# Patient Record
Sex: Female | Born: 1955
Health system: Southern US, Community
[De-identification: ages and names within clinical notes are randomized; demographics above are authoritative.]

## PROBLEM LIST (undated history)

## (undated) ENCOUNTER — Emergency Department (HOSPITAL_COMMUNITY): Admission: EM | Source: Skilled Nursing Facility

## (undated) DIAGNOSIS — H409 Unspecified glaucoma: Secondary | ICD-10-CM

## (undated) DIAGNOSIS — M1990 Unspecified osteoarthritis, unspecified site: Secondary | ICD-10-CM

## (undated) DIAGNOSIS — J189 Pneumonia, unspecified organism: Secondary | ICD-10-CM

## (undated) DIAGNOSIS — K219 Gastro-esophageal reflux disease without esophagitis: Secondary | ICD-10-CM

## (undated) DIAGNOSIS — Z8781 Personal history of (healed) traumatic fracture: Secondary | ICD-10-CM

## (undated) DIAGNOSIS — Z9889 Other specified postprocedural states: Secondary | ICD-10-CM

## (undated) DIAGNOSIS — F419 Anxiety disorder, unspecified: Secondary | ICD-10-CM

## (undated) DIAGNOSIS — Z8489 Family history of other specified conditions: Secondary | ICD-10-CM

## (undated) DIAGNOSIS — T7840XA Allergy, unspecified, initial encounter: Secondary | ICD-10-CM

## (undated) DIAGNOSIS — R112 Nausea with vomiting, unspecified: Secondary | ICD-10-CM

## (undated) DIAGNOSIS — F329 Major depressive disorder, single episode, unspecified: Secondary | ICD-10-CM

## (undated) DIAGNOSIS — R519 Headache, unspecified: Secondary | ICD-10-CM

## (undated) DIAGNOSIS — I10 Essential (primary) hypertension: Secondary | ICD-10-CM

## (undated) DIAGNOSIS — G4730 Sleep apnea, unspecified: Secondary | ICD-10-CM

## (undated) DIAGNOSIS — T4145XA Adverse effect of unspecified anesthetic, initial encounter: Secondary | ICD-10-CM

## (undated) DIAGNOSIS — F32A Depression, unspecified: Secondary | ICD-10-CM

## (undated) DIAGNOSIS — J449 Chronic obstructive pulmonary disease, unspecified: Secondary | ICD-10-CM

## (undated) DIAGNOSIS — T8859XA Other complications of anesthesia, initial encounter: Secondary | ICD-10-CM

## (undated) DIAGNOSIS — J45909 Unspecified asthma, uncomplicated: Secondary | ICD-10-CM

## (undated) HISTORY — DX: Gastro-esophageal reflux disease without esophagitis: K21.9

## (undated) HISTORY — PX: VAGINAL HYSTERECTOMY: SUR661

## (undated) HISTORY — DX: Anxiety disorder, unspecified: F41.9

## (undated) HISTORY — DX: Depression, unspecified: F32.A

## (undated) HISTORY — PX: OOPHORECTOMY: SHX86

## (undated) HISTORY — DX: Allergy, unspecified, initial encounter: T78.40XA

## (undated) HISTORY — DX: Essential (primary) hypertension: I10

## (undated) HISTORY — PX: TOTAL KNEE ARTHROPLASTY: SHX125

## (undated) HISTORY — DX: Major depressive disorder, single episode, unspecified: F32.9

---
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
## 2005-05-22 ENCOUNTER — Ambulatory Visit: Payer: Self-pay

## 2006-08-13 ENCOUNTER — Ambulatory Visit: Payer: Self-pay

## 2006-08-20 ENCOUNTER — Ambulatory Visit: Payer: Self-pay | Admitting: Family Medicine

## 2007-10-01 ENCOUNTER — Ambulatory Visit: Payer: Self-pay

## 2008-02-10 ENCOUNTER — Ambulatory Visit: Payer: Self-pay

## 2009-02-09 ENCOUNTER — Ambulatory Visit: Payer: Self-pay

## 2009-04-30 LAB — HM COLONOSCOPY: HM COLON: NORMAL

## 2009-09-06 ENCOUNTER — Ambulatory Visit: Payer: Self-pay | Admitting: Family Medicine

## 2009-12-27 ENCOUNTER — Ambulatory Visit: Payer: Self-pay | Admitting: Family Medicine

## 2010-03-01 ENCOUNTER — Ambulatory Visit: Payer: Self-pay

## 2010-08-08 ENCOUNTER — Ambulatory Visit: Payer: Self-pay | Admitting: Internal Medicine

## 2010-08-15 ENCOUNTER — Ambulatory Visit: Payer: Self-pay | Admitting: Internal Medicine

## 2011-03-21 ENCOUNTER — Ambulatory Visit: Payer: Self-pay | Admitting: Family Medicine

## 2012-03-21 ENCOUNTER — Ambulatory Visit: Payer: Self-pay | Admitting: Family Medicine

## 2012-03-31 ENCOUNTER — Ambulatory Visit: Payer: Self-pay | Admitting: Family Medicine

## 2013-04-21 ENCOUNTER — Ambulatory Visit: Payer: Self-pay | Admitting: Family Medicine

## 2013-09-09 DIAGNOSIS — M179 Osteoarthritis of knee, unspecified: Secondary | ICD-10-CM | POA: Insufficient documentation

## 2013-09-09 DIAGNOSIS — M1710 Unilateral primary osteoarthritis, unspecified knee: Secondary | ICD-10-CM | POA: Insufficient documentation

## 2013-10-02 ENCOUNTER — Ambulatory Visit: Payer: Self-pay | Admitting: Unknown Physician Specialty

## 2014-04-15 LAB — LIPID PANEL
CHOLESTEROL: 175 mg/dL (ref 0–200)
HDL: 67 mg/dL (ref 35–70)
LDL Cholesterol: 88 mg/dL
Triglycerides: 102 mg/dL (ref 40–160)

## 2014-04-15 LAB — BASIC METABOLIC PANEL
Creatinine: 1 mg/dL (ref <=1.1)
GLUCOSE: 92 mg/dL

## 2014-04-29 ENCOUNTER — Ambulatory Visit: Payer: Self-pay | Admitting: Family Medicine

## 2014-04-29 LAB — HM MAMMOGRAPHY: HM MAMMO: NORMAL

## 2014-05-04 DIAGNOSIS — S20479A Other superficial bite of unspecified back wall of thorax, initial encounter: Secondary | ICD-10-CM | POA: Diagnosis not present

## 2014-06-16 ENCOUNTER — Ambulatory Visit: Payer: Self-pay | Admitting: Family Medicine

## 2014-06-16 DIAGNOSIS — S8392XA Sprain of unspecified site of left knee, initial encounter: Secondary | ICD-10-CM | POA: Diagnosis not present

## 2014-06-16 DIAGNOSIS — S8992XA Unspecified injury of left lower leg, initial encounter: Secondary | ICD-10-CM | POA: Diagnosis not present

## 2014-06-16 DIAGNOSIS — M25562 Pain in left knee: Secondary | ICD-10-CM | POA: Diagnosis not present

## 2014-06-21 DIAGNOSIS — M1712 Unilateral primary osteoarthritis, left knee: Secondary | ICD-10-CM | POA: Diagnosis not present

## 2014-06-25 ENCOUNTER — Ambulatory Visit: Payer: Self-pay | Admitting: Orthopedic Surgery

## 2014-06-25 DIAGNOSIS — M1712 Unilateral primary osteoarthritis, left knee: Secondary | ICD-10-CM | POA: Diagnosis not present

## 2014-06-25 DIAGNOSIS — Z03818 Encounter for observation for suspected exposure to other biological agents ruled out: Secondary | ICD-10-CM | POA: Diagnosis not present

## 2014-07-14 ENCOUNTER — Ambulatory Visit: Payer: Self-pay | Admitting: Orthopedic Surgery

## 2014-07-14 DIAGNOSIS — E871 Hypo-osmolality and hyponatremia: Secondary | ICD-10-CM | POA: Diagnosis not present

## 2014-07-14 DIAGNOSIS — Z79899 Other long term (current) drug therapy: Secondary | ICD-10-CM | POA: Diagnosis not present

## 2014-07-14 DIAGNOSIS — K219 Gastro-esophageal reflux disease without esophagitis: Secondary | ICD-10-CM | POA: Diagnosis not present

## 2014-07-14 DIAGNOSIS — M1712 Unilateral primary osteoarthritis, left knee: Secondary | ICD-10-CM | POA: Diagnosis not present

## 2014-07-14 DIAGNOSIS — Z01812 Encounter for preprocedural laboratory examination: Secondary | ICD-10-CM | POA: Diagnosis not present

## 2014-07-14 DIAGNOSIS — R0602 Shortness of breath: Secondary | ICD-10-CM | POA: Diagnosis not present

## 2014-07-14 DIAGNOSIS — Z01810 Encounter for preprocedural cardiovascular examination: Secondary | ICD-10-CM | POA: Diagnosis not present

## 2014-07-14 DIAGNOSIS — D62 Acute posthemorrhagic anemia: Secondary | ICD-10-CM | POA: Diagnosis not present

## 2014-07-14 DIAGNOSIS — I10 Essential (primary) hypertension: Secondary | ICD-10-CM | POA: Diagnosis not present

## 2014-07-27 ENCOUNTER — Inpatient Hospital Stay
Admit: 2014-07-27 | Disposition: A | Discharge status: Skilled Nursing Facility | Payer: Self-pay | Attending: Orthopedic Surgery | Admitting: Orthopedic Surgery

## 2014-07-27 DIAGNOSIS — F419 Anxiety disorder, unspecified: Secondary | ICD-10-CM | POA: Diagnosis not present

## 2014-07-27 DIAGNOSIS — Z471 Aftercare following joint replacement surgery: Secondary | ICD-10-CM | POA: Diagnosis not present

## 2014-07-27 DIAGNOSIS — H409 Unspecified glaucoma: Secondary | ICD-10-CM | POA: Diagnosis not present

## 2014-07-27 DIAGNOSIS — K589 Irritable bowel syndrome without diarrhea: Secondary | ICD-10-CM | POA: Diagnosis not present

## 2014-07-27 DIAGNOSIS — D62 Acute posthemorrhagic anemia: Secondary | ICD-10-CM | POA: Diagnosis not present

## 2014-07-27 DIAGNOSIS — F329 Major depressive disorder, single episode, unspecified: Secondary | ICD-10-CM | POA: Diagnosis not present

## 2014-07-27 DIAGNOSIS — M179 Osteoarthritis of knee, unspecified: Secondary | ICD-10-CM | POA: Diagnosis not present

## 2014-07-27 DIAGNOSIS — M5030 Other cervical disc degeneration, unspecified cervical region: Secondary | ICD-10-CM | POA: Diagnosis not present

## 2014-07-27 DIAGNOSIS — I10 Essential (primary) hypertension: Secondary | ICD-10-CM | POA: Diagnosis not present

## 2014-07-27 DIAGNOSIS — G4733 Obstructive sleep apnea (adult) (pediatric): Secondary | ICD-10-CM | POA: Diagnosis not present

## 2014-07-27 DIAGNOSIS — K219 Gastro-esophageal reflux disease without esophagitis: Secondary | ICD-10-CM | POA: Diagnosis not present

## 2014-07-27 DIAGNOSIS — M519 Unspecified thoracic, thoracolumbar and lumbosacral intervertebral disc disorder: Secondary | ICD-10-CM | POA: Diagnosis not present

## 2014-07-27 DIAGNOSIS — E669 Obesity, unspecified: Secondary | ICD-10-CM | POA: Diagnosis not present

## 2014-07-27 DIAGNOSIS — J45909 Unspecified asthma, uncomplicated: Secondary | ICD-10-CM | POA: Diagnosis not present

## 2014-07-27 DIAGNOSIS — M1712 Unilateral primary osteoarthritis, left knee: Secondary | ICD-10-CM | POA: Diagnosis not present

## 2014-07-27 DIAGNOSIS — Z96652 Presence of left artificial knee joint: Secondary | ICD-10-CM | POA: Diagnosis not present

## 2014-07-27 DIAGNOSIS — Z6841 Body Mass Index (BMI) 40.0 and over, adult: Secondary | ICD-10-CM | POA: Diagnosis not present

## 2014-07-27 DIAGNOSIS — G4730 Sleep apnea, unspecified: Secondary | ICD-10-CM | POA: Diagnosis not present

## 2014-07-27 DIAGNOSIS — E871 Hypo-osmolality and hyponatremia: Secondary | ICD-10-CM | POA: Diagnosis not present

## 2014-07-27 DIAGNOSIS — M21952 Unspecified acquired deformity of left thigh: Secondary | ICD-10-CM | POA: Diagnosis not present

## 2014-07-27 HISTORY — PX: TOTAL KNEE ARTHROPLASTY: SHX125

## 2014-07-28 LAB — BASIC METABOLIC PANEL
Anion Gap: 6 — ABNORMAL LOW (ref 7–16)
BUN: 13 mg/dL
CHLORIDE: 94 mmol/L — AB
CO2: 32 mmol/L
Calcium, Total: 8.1 mg/dL — ABNORMAL LOW
Creatinine: 1.01 mg/dL — ABNORMAL HIGH
EGFR (Non-African Amer.): >60
GLUCOSE: 136 mg/dL — AB
POTASSIUM: 3.6 mmol/L
Sodium: 132 mmol/L — ABNORMAL LOW

## 2014-07-28 LAB — PLATELET COUNT: PLATELETS: 293 10*3/uL (ref 150–440)

## 2014-07-28 LAB — HEMOGLOBIN: HGB: 10.7 g/dL — AB (ref 12.0–16.0)

## 2014-07-29 LAB — CBC
HCT: 29.2 % — ABNORMAL LOW (ref 35.0–47.0)
HGB: 9.9 g/dL — ABNORMAL LOW (ref 12.0–16.0)
MCH: 28.4 pg (ref 26.0–34.0)
MCHC: 33.9 g/dL (ref 32.0–36.0)
MCV: 84 fL (ref 80–100)
Platelet: 267 10*3/uL (ref 150–440)
RBC: 3.48 10*6/uL — ABNORMAL LOW (ref 3.80–5.20)
RDW: 14.9 % — ABNORMAL HIGH (ref 11.5–14.5)
WBC: 10.9 10*3/uL (ref 3.6–11.0)

## 2014-07-29 LAB — BASIC METABOLIC PANEL
Anion Gap: 2 — ABNORMAL LOW (ref 7–16)
BUN: 11 mg/dL
CHLORIDE: 97 mmol/L — AB
CO2: 30 mmol/L
Calcium, Total: 7.5 mg/dL — ABNORMAL LOW
Creatinine: 0.8 mg/dL
EGFR (African American): >60
EGFR (Non-African Amer.): >60
GLUCOSE: 121 mg/dL — AB
POTASSIUM: 3.7 mmol/L
Sodium: 129 mmol/L — ABNORMAL LOW

## 2014-07-30 DIAGNOSIS — Z96652 Presence of left artificial knee joint: Secondary | ICD-10-CM | POA: Diagnosis not present

## 2014-07-30 DIAGNOSIS — J4540 Moderate persistent asthma, uncomplicated: Secondary | ICD-10-CM | POA: Diagnosis not present

## 2014-07-30 DIAGNOSIS — L249 Irritant contact dermatitis, unspecified cause: Secondary | ICD-10-CM | POA: Diagnosis not present

## 2014-07-30 DIAGNOSIS — J45909 Unspecified asthma, uncomplicated: Secondary | ICD-10-CM | POA: Diagnosis not present

## 2014-07-30 DIAGNOSIS — G4730 Sleep apnea, unspecified: Secondary | ICD-10-CM | POA: Diagnosis not present

## 2014-07-30 DIAGNOSIS — F419 Anxiety disorder, unspecified: Secondary | ICD-10-CM | POA: Diagnosis not present

## 2014-07-30 DIAGNOSIS — I10 Essential (primary) hypertension: Secondary | ICD-10-CM | POA: Diagnosis not present

## 2014-07-30 DIAGNOSIS — K219 Gastro-esophageal reflux disease without esophagitis: Secondary | ICD-10-CM | POA: Diagnosis not present

## 2014-07-30 DIAGNOSIS — F329 Major depressive disorder, single episode, unspecified: Secondary | ICD-10-CM | POA: Diagnosis not present

## 2014-07-30 DIAGNOSIS — E669 Obesity, unspecified: Secondary | ICD-10-CM | POA: Diagnosis not present

## 2014-07-30 DIAGNOSIS — Z471 Aftercare following joint replacement surgery: Secondary | ICD-10-CM | POA: Diagnosis not present

## 2014-07-30 DIAGNOSIS — M519 Unspecified thoracic, thoracolumbar and lumbosacral intervertebral disc disorder: Secondary | ICD-10-CM | POA: Diagnosis not present

## 2014-07-30 DIAGNOSIS — M150 Primary generalized (osteo)arthritis: Secondary | ICD-10-CM | POA: Diagnosis not present

## 2014-07-30 DIAGNOSIS — E871 Hypo-osmolality and hyponatremia: Secondary | ICD-10-CM | POA: Diagnosis not present

## 2014-07-30 LAB — BASIC METABOLIC PANEL
Anion Gap: 6 — ABNORMAL LOW (ref 7–16)
BUN: 13 mg/dL
CREATININE: 0.78 mg/dL
Calcium, Total: 7.4 mg/dL — ABNORMAL LOW
Chloride: 96 mmol/L — ABNORMAL LOW
Co2: 28 mmol/L
EGFR (African American): >60
EGFR (Non-African Amer.): >60
Glucose: 123 mg/dL — ABNORMAL HIGH
POTASSIUM: 3.8 mmol/L
Sodium: 130 mmol/L — ABNORMAL LOW

## 2014-07-30 LAB — HEPATIC FUNCTION PANEL A (ARMC)
Albumin: 2.8 g/dL — ABNORMAL LOW
Alkaline Phosphatase: 49 U/L
BILIRUBIN INDIRECT: 0.4
BILIRUBIN TOTAL: 0.6 mg/dL
Bilirubin, Direct: 0.2 mg/dL
SGOT(AST): 45 U/L — ABNORMAL HIGH
SGPT (ALT): 22 U/L
TOTAL PROTEIN: 5.9 g/dL — AB

## 2014-07-30 LAB — LIPID PANEL
Cholesterol: 123 mg/dL
HDL: 54 mg/dL
Ldl Cholesterol, Calc: 57 mg/dL
Triglycerides: 59 mg/dL
VLDL Cholesterol, Calc: 12 mg/dL

## 2014-07-30 LAB — TSH: Thyroid Stimulating Horm: 3.768 u[IU]/mL

## 2014-08-03 DIAGNOSIS — J4540 Moderate persistent asthma, uncomplicated: Secondary | ICD-10-CM | POA: Diagnosis not present

## 2014-08-03 DIAGNOSIS — K219 Gastro-esophageal reflux disease without esophagitis: Secondary | ICD-10-CM | POA: Diagnosis not present

## 2014-08-03 DIAGNOSIS — L249 Irritant contact dermatitis, unspecified cause: Secondary | ICD-10-CM | POA: Diagnosis not present

## 2014-08-03 DIAGNOSIS — I10 Essential (primary) hypertension: Secondary | ICD-10-CM | POA: Diagnosis not present

## 2014-08-03 DIAGNOSIS — M150 Primary generalized (osteo)arthritis: Secondary | ICD-10-CM | POA: Diagnosis not present

## 2014-08-10 DIAGNOSIS — J4540 Moderate persistent asthma, uncomplicated: Secondary | ICD-10-CM | POA: Diagnosis not present

## 2014-08-10 DIAGNOSIS — L249 Irritant contact dermatitis, unspecified cause: Secondary | ICD-10-CM | POA: Diagnosis not present

## 2014-08-10 DIAGNOSIS — M150 Primary generalized (osteo)arthritis: Secondary | ICD-10-CM | POA: Diagnosis not present

## 2014-08-13 DIAGNOSIS — G4730 Sleep apnea, unspecified: Secondary | ICD-10-CM | POA: Diagnosis not present

## 2014-08-13 DIAGNOSIS — F329 Major depressive disorder, single episode, unspecified: Secondary | ICD-10-CM | POA: Diagnosis not present

## 2014-08-13 DIAGNOSIS — J45909 Unspecified asthma, uncomplicated: Secondary | ICD-10-CM | POA: Diagnosis not present

## 2014-08-13 DIAGNOSIS — I10 Essential (primary) hypertension: Secondary | ICD-10-CM | POA: Diagnosis not present

## 2014-08-13 DIAGNOSIS — Z471 Aftercare following joint replacement surgery: Secondary | ICD-10-CM | POA: Diagnosis not present

## 2014-08-13 DIAGNOSIS — Z96652 Presence of left artificial knee joint: Secondary | ICD-10-CM | POA: Diagnosis not present

## 2014-08-16 DIAGNOSIS — J45909 Unspecified asthma, uncomplicated: Secondary | ICD-10-CM | POA: Diagnosis not present

## 2014-08-16 DIAGNOSIS — F329 Major depressive disorder, single episode, unspecified: Secondary | ICD-10-CM | POA: Diagnosis not present

## 2014-08-16 DIAGNOSIS — I10 Essential (primary) hypertension: Secondary | ICD-10-CM | POA: Diagnosis not present

## 2014-08-16 DIAGNOSIS — G4730 Sleep apnea, unspecified: Secondary | ICD-10-CM | POA: Diagnosis not present

## 2014-08-16 DIAGNOSIS — Z471 Aftercare following joint replacement surgery: Secondary | ICD-10-CM | POA: Diagnosis not present

## 2014-08-16 DIAGNOSIS — Z96652 Presence of left artificial knee joint: Secondary | ICD-10-CM | POA: Diagnosis not present

## 2014-08-18 DIAGNOSIS — Z96652 Presence of left artificial knee joint: Secondary | ICD-10-CM | POA: Diagnosis not present

## 2014-08-18 DIAGNOSIS — F329 Major depressive disorder, single episode, unspecified: Secondary | ICD-10-CM | POA: Diagnosis not present

## 2014-08-18 DIAGNOSIS — Z471 Aftercare following joint replacement surgery: Secondary | ICD-10-CM | POA: Diagnosis not present

## 2014-08-18 DIAGNOSIS — I10 Essential (primary) hypertension: Secondary | ICD-10-CM | POA: Diagnosis not present

## 2014-08-18 DIAGNOSIS — G4730 Sleep apnea, unspecified: Secondary | ICD-10-CM | POA: Diagnosis not present

## 2014-08-18 DIAGNOSIS — J45909 Unspecified asthma, uncomplicated: Secondary | ICD-10-CM | POA: Diagnosis not present

## 2014-08-20 DIAGNOSIS — F329 Major depressive disorder, single episode, unspecified: Secondary | ICD-10-CM | POA: Diagnosis not present

## 2014-08-20 DIAGNOSIS — J45909 Unspecified asthma, uncomplicated: Secondary | ICD-10-CM | POA: Diagnosis not present

## 2014-08-20 DIAGNOSIS — G4730 Sleep apnea, unspecified: Secondary | ICD-10-CM | POA: Diagnosis not present

## 2014-08-20 DIAGNOSIS — Z96652 Presence of left artificial knee joint: Secondary | ICD-10-CM | POA: Diagnosis not present

## 2014-08-20 DIAGNOSIS — Z471 Aftercare following joint replacement surgery: Secondary | ICD-10-CM | POA: Diagnosis not present

## 2014-08-20 DIAGNOSIS — I10 Essential (primary) hypertension: Secondary | ICD-10-CM | POA: Diagnosis not present

## 2014-08-23 DIAGNOSIS — Z471 Aftercare following joint replacement surgery: Secondary | ICD-10-CM | POA: Diagnosis not present

## 2014-08-23 DIAGNOSIS — G4730 Sleep apnea, unspecified: Secondary | ICD-10-CM | POA: Diagnosis not present

## 2014-08-23 DIAGNOSIS — I10 Essential (primary) hypertension: Secondary | ICD-10-CM | POA: Diagnosis not present

## 2014-08-23 DIAGNOSIS — F329 Major depressive disorder, single episode, unspecified: Secondary | ICD-10-CM | POA: Diagnosis not present

## 2014-08-23 DIAGNOSIS — J45909 Unspecified asthma, uncomplicated: Secondary | ICD-10-CM | POA: Diagnosis not present

## 2014-08-23 DIAGNOSIS — Z96652 Presence of left artificial knee joint: Secondary | ICD-10-CM | POA: Diagnosis not present

## 2014-08-23 LAB — SURGICAL PATHOLOGY

## 2014-08-25 DIAGNOSIS — J45909 Unspecified asthma, uncomplicated: Secondary | ICD-10-CM | POA: Diagnosis not present

## 2014-08-25 DIAGNOSIS — F329 Major depressive disorder, single episode, unspecified: Secondary | ICD-10-CM | POA: Diagnosis not present

## 2014-08-25 DIAGNOSIS — Z471 Aftercare following joint replacement surgery: Secondary | ICD-10-CM | POA: Diagnosis not present

## 2014-08-25 DIAGNOSIS — G4730 Sleep apnea, unspecified: Secondary | ICD-10-CM | POA: Diagnosis not present

## 2014-08-25 DIAGNOSIS — I10 Essential (primary) hypertension: Secondary | ICD-10-CM | POA: Diagnosis not present

## 2014-08-25 DIAGNOSIS — Z96652 Presence of left artificial knee joint: Secondary | ICD-10-CM | POA: Diagnosis not present

## 2014-08-27 DIAGNOSIS — Z96652 Presence of left artificial knee joint: Secondary | ICD-10-CM | POA: Diagnosis not present

## 2014-08-29 NOTE — Op Note (Signed)
PATIENT NAME:  Diane Small, Diane DanceCONDOZA P MR#:  [id number] DATE OF BIRTH:  [birth year]  DATE OF PROCEDURE:  07/27/2014  PREOPERATIVE DIAGNOSIS: Left knee osteoarthritis, severe.   POSTOPERATIVE DIAGNOSIS:  Left knee osteoarthritis, severe.   PROCEDURE: Left total knee replacement.   SURGEON: Kennedy BuckerMichael Jeris Easterly, M.D.   ASSISTANT: Cranston Neighborhris Gaines, PA-C  ANESTHESIA: Spinal.   DESCRIPTION OF PROCEDURE: The patient was brought to the operating room and after adequate anesthesia was obtained, the left leg was prepped and draped in the usual sterile fashion with a bump underneath the left buttock to internally rotate the leg. After prepping and draping in the usual sterile fashion, appropriate patient identification and timeout procedures were completed. The tourniquet was raised to 300 mmHg. A midline skin incision was made followed by a medial parapatellar arthrotomy. The case was made more difficult secondary to the patient's large size, but good exposure of the anterior knee was obtained followed by a medial parapatellar arthrotomy. Inspection revealed significant tricompartmental osteoarthritis with deformity to the lateral femoral condyle. ACL and fat pad were excised and the bone exposed for application of the Medacta cutting guide to the proximal tibia. Tibia cut was carried out and then the femoral distal cut in a similar fashion followed by the 4-in-1 cutting guide to the distal femur. After this was completed, the residual posterior horns of the menisci were removed. The size 2 tibial base plate was applied and hand reaming carried out for the stemmed inserts with her large size. The tibial keel punch was then placed followed by application of the femoral trial, distal femoral drill holes made for subsequent implant fixation. The notch cut was also carried out. A 12 mm insert gave excellent stability throughout a range of motion. It was chosen for the final implant. At this point, the patella was measured and cut  using the guide and sized to a size 2 after 3 drill holes were made for the patellar button. The tourniquet was let down at this point and hemostasis checked with electrocautery. The knee was infiltrated with a dilute solution of Exparel as well as a combination of Toradol, morphine and 0.25% Sensorcaine with epinephrine diluted with saline as well. The tourniquet was raised and the bony surfaces thoroughly irrigated and dried. All components were cemented in place with the size 2 tibial base plate and an 11 x 30 stem, a 2 left sphere femur, the 2 left 12 mm flex insert and a 2 patella, again with all components cemented. After the cement had set and excess cement had been removed, the knee was thoroughly irrigated with Betadine solution with 5 minute soak and then thorough irrigation of the knee. The arthrotomy was repaired using a heavy Quill followed by a subcutaneous drain, 2-0 quill subcutaneously and skin staples. Xeroform, 4 x 4's, ABD, Webril, Ace wrap, and Polar Care applied. The patient was sent to the recovery room in stable condition.   ESTIMATED BLOOD LOSS: 200.   COMPLICATIONS: None.   SPECIMEN: Cut ends of bone.  TOURNIQUET TIME: 75 minutes at 300 mmHg.  IMPLANTS: Medacta GMK sphere, 2+ femur, 2 tibia, 12 mm insert, 11 x 30 tibial stem and 2 patella.  ____________________________ Leitha SchullerMichael J. Bjorn Hallas, MD mjm:sb D: 07/27/2014 10:29:57 ET T: 07/27/2014 12:11:14 ET JOB#: [id number]  cc: Leitha SchullerMichael J. Cortlin Marano, MD, <Dictator> Leitha SchullerMICHAEL J Linn Clavin MD ELECTRONICALLY SIGNED 07/27/2014 16:54

## 2014-08-29 NOTE — Consult Note (Signed)
PATIENT NAME:  Diane Small, Diane Small MR#:  [id number] DATE OF BIRTH:  1955/11/16  DATE OF CONSULTATION:  07/30/2014  REFERRING PHYSICIAN:   Leitha Schuller, MD CONSULTING PHYSICIAN:  Ena Dawley. Clent Ridges, MD  NURSE PRACTITIONER: Lollie Marrow, PA-C.  REASON FOR CONSULTATION: Hyponatremia.   HISTORY OF PRESENT ILLNESS: This very pleasant 59 year old woman with past medical history of obesity, obstructive sleep apnea, hypertension, and anxiety, was admitted on March 29 for left total knee replacement by Dr. Rosita Kea under spinal anesthesia. Procedure was performed without complication. On presentation, her sodium was 132, dropped to 129 on the day after surgery and has increased to 130. Hospitalist services are asked to evaluate hyponatremia. She is asymptomatic. No headache, nausea, malaise or lethargy.   PAST MEDICAL HISTORY:  1. Obstructive sleep apnea.  2. Osteoarthritis.  3. Gastroesophageal reflux disease.  4. Obesity.  5. Irritable bowel syndrome.  6. Hypertension.  7. Glaucoma.  8. Anxiety and depression.   PAST SURGICAL HISTORY:  1. Left total knee replacement 07/27/2014. 2. Cesarean section x2.  3. Hysterectomy.   SOCIAL HISTORY: The patient usually lives alone. She does not use a cane or walker for ambulation at baseline but after the surgery will be using a walker. She is currently discharged to [facility] skilled nursing facility for continued rehab. She does not smoke cigarettes. She drinks alcohol rarely. She is on disability due to knee pain.   FAMILY MEDICAL HISTORY: Positive for coronary artery disease in her father. No family history of strokes, diabetes or renal disease.   REVIEW OF SYSTEMS:  GENERAL: No fevers or chills, weakness, or change in weight.  HEENT: No change in vision or hearing. No pain in the eyes or ears. No sinus congestion, sore throat or difficulty swallowing. She does have a dry mouth.  CARDIOVASCULAR: No chest pain, palpitations, edema, or  orthopnea. No syncope.  RESPIRATORY: No cough, wheezing, shortness of breath, or painful respiration.  GASTROINTESTINAL: Some nausea associated with pain medication, no vomiting, diarrhea, or abdominal pain.  GENITOURINARY: No dysuria or frequency.  MUSCULOSKELETAL: The left knee is very painful after surgery, otherwise no new pain in the neck, back, shoulders, knees, or hips. No swollen tender joints.  SKIN: She does have a new left knee surgical scar. No other new wounds, rashes, or lesions.  NEUROLOGIC: No history of bipolar disorder or schizophrenia. She does have a history of anxiety and depression. Fairly anxious at this time. Her primary care provider has been working with her on this and changing medications recently.   HOME MEDICATIONS:  1. Vitamin C 1000 mg 2 tablets once a day.  2. Tramadol 50 mg 1 tablet every 6 hours as needed for pain.  3. Simethicone 80 mg 1 tablet every 6 hours as needed for gas. 4. Pantoprazole 40 mg 1 tablet daily.  5. Oxycodone 10 mg 1 tablet every 4 hours as needed for pain.  6. Micardis 40 mg 1 tablet once a day.  7. Magnesium hydroxide 8% oral suspension 30 mL orally twice a day as needed for constipation.  8. Lexapro 5 mg 1 tablet once a day in the morning.  9. Latanoprost ophthalmic 0.005% ophthalmic solution one drop to each eye in the morning.  10. Hydrochlorothiazide 25 mg 1 tablet once a day.  11. Fish oil 1200 mg 1 capsule once a day.  12. Enoxaparin 40 mg subcutaneously once a day for 14 days after the surgery.  13. Bisacodyl 10 mg rectal suppository once a day  as needed for constipation.  14. Aspirin 81 mg daily.  15. Alprazolam 1 mg orally 1 tablet twice a day as needed for anxiety. 16. Albuterol ipratropium CFC free 100 mcg/20 mcg per inhalation 1 puff inhaled 4 times a day as needed for wheezing.  17. Acetaminophen 500 mg 1 tablet every 4 hours as needed for pain or fever.   ALLERGIES: No known allergies.   PHYSICAL EXAMINATION:  VITAL  SIGNS: Temperature 97.9, pulse 98, respirations 18, blood pressure 132/77, oxygenation 96% on room air.  GENERAL: No acute distress.  HEENT: Pupils equal, round, and reactive to light. Conjunctivae are clear. Extraocular motion is intact. Oral mucous membranes are pink and moist. Good dentition. Posterior oropharynx is clear with no exudate, edema, or erythema.  NECK: Supple. No cervical lymphadenopathy. Trachea is midline.  RESPIRATORY: Lungs are clear to auscultation bilaterally with good air movement. No respiratory distress. No rhonchi, wheezes, or rales.  CARDIOVASCULAR: Regular rate and rhythm. No murmurs, rubs, or gallops. No carotid bruit. No JVD. No peripheral edema. Peripheral pulses are 2+.  ABDOMEN: Soft, nontender, nondistended. No guarding. No rebound. Bowel sounds are normal. No hepatosplenomegaly or mass noted. She is obese.  SKIN: No rashes, lesions, or open wounds.  MUSCULOSKELETAL: Strength is five out of five throughout with the exception of the left leg which was not tested. Range of motion normal throughout.  NEUROLOGIC: Cranial nerves II through XII grossly intact. Sensation and strength are intact. Nonfocal.  PSYCHIATRIC: She is anxious. She is alert and oriented with good insight into her clinical condition.   LABORATORY DATA: Sodium 130, potassium 3.8, chloride 96, bicarbonate 29, BUN 13, creatinine 0.78, glucose 123. Calcium is 7.4. White blood cells 10.9, hemoglobin 9.9, platelets 267. MCV is 84.  IMAGING: X-ray of the left hip after surgery March 29 shows left total knee joint prosthesis in place without complication.   ASSESSMENT AND PLAN:  1. Left knee osteoarthritis status post total knee replacement: Physical therapy, pain control and DVT prophylaxis per orthopedics.  2. Hyponatremia: The patient states that she has never been hyponatremic before. I have ordered a few labs including hepatic function, TSH, and lipid panel. I think that the most likely cause of her  hyponatremia is decreased p.o. intake with hydration with lactated Ringer's as well as the 2 home medications, hydrochlorothiazide and Lexapro, which can both contribute to hyponatremia. At this point, I think that she is asymptomatic with an improving sodium level. Agree with fluid restriction. Agree with holding hydrochlorothiazide. Her blood pressure has been well controlled over the past 24 hours and she has not received this medication during that time. Would continue to hold hydrochlorothiazide. If blood pressure increases, would attempt to use a different agent such as lisinopril which would not impact her sodium level. If she continues to be hyponatremic, I would consider changing her SSRI. I hesitate to do this now as she has just had some changes and feels that the new SSRI does help "even her out." I think that she is stable for discharge. She needs a repeat BMET on Sunday, April 3. From that point, the attending at [facility] can adjust medications, diet and fluid to manage her hyponatremia.  3. Hypertension: Controlled at this time. Continue with Micardis. Hold hydrochlorothiazide.  4. Anxiety and depression: Continue with Lexapro and Xanax.  5. Irritable bowel syndrome: She is asymptomatic at this time. I agree with a robust bowel regimen, as she may become constipated with the addition of narcotic pain medications.  The patient is a full code.   Thank you for this consultation. I think that the patient is safe for discharge to skilled nursing at this time. I will put in orders for a repeat BMP on Sunday, April 3.  TIME SPENT ON CONSULTATION: 40 minutes.   ____________________________ Ena Dawley. Clent Ridges, MD cpw:jh D: 07/30/2014 11:20:36 ET T: 07/30/2014 11:31:22 ET JOB#: [id number]  cc: Santina Evans P. Clent Ridges, MD, <Dictator> Gale Journey MD ELECTRONICALLY SIGNED 08/07/2014 14:38

## 2014-08-29 NOTE — Discharge Summary (Signed)
PATIENT NAME:  Diane Small, Diane Small MR#:  [id number] DATE OF BIRTH:  [birth year]  DATE OF ADMISSION:  07/27/2014 DATE OF DISCHARGE:  07/30/2014  ADMITTING DIAGNOSIS: Left knee osteoarthritis.   DISCHARGE DIAGNOSIS: Left knee osteoarthritis.   PROCEDURE: Left total knee arthroplasty.   SURGEON: Leitha SchullerMichael J. Menz, M.D.   ASSISTANT: Cranston Neighborhris Gaines, PA-C.   ANESTHESIA: Spinal.   ESTIMATED BLOOD LOSS: 200 mL.   COMPLICATIONS: None.   SPECIMEN: Cut ends of bone.   TOURNIQUET TIME: 75 minutes at 300 mmHg.  IMPLANTS: Medacta GMK sphere, 2+ femur, 2 tibia, 12 mm insert, 11 x 30 tibial stem and 2 patella.   HISTORY: The patient is a 59 year old female who has had progressive left knee pain for greater than 1 year. Pain has interfered with her activities of daily living. She has failed nonoperative treatment including cortisone injections, anti-inflammatory medications as well as physical supplementation. The patient has agreed and consented to a left total knee arthroplasty.   PHYSICAL EXAMINATION: GENERAL: Obese female in no apparent distress. Normal affect. Antalgic component to left lower extremity.  HEART: Regular rate and rhythm.  LUNGS: Clear to auscultation bilaterally. No wheezes, rales or rhonchi.  LEFT KNEE: The patient has mild swelling with no warmth, erythema or effusion. She is tender along the medial and lateral joint line. She has 0 to 110 degrees range of motion. There is no instability with valgus or varus stress testing. She has a negative Homans sign. She is neurovascularly intact to the left lower extremity.   HOSPITAL COURSE: The patient was admitted to the hospital on July 27, 2014. She had surgery that same day and was brought to the orthopedic floor from the PACU in stable condition. On postoperative day 1, the patient had an acute postoperative blood loss anemia with a hemoglobin of 10.7. Potassium was down to 132. On postoperative day 2, potassium was down to 129, her  fluids were restricted to 1400 mL daily, she was continued with normal saline and her hydrochlorothiazide was discontinued. The patient's hemoglobin was down to 9.9. Her vital signs were stable. She was not having any issues with hypertension. Pain was well controlled and she did make good slow progress with physical therapy. On postoperative day 3, sodium was up to 130 but was still low. Medicine was consulted and they agreed with discontinuing the hydrochlorothiazide at this time and giving IV saline. The patient will have labs checked on August 01, 2014 at [facility] and treating physician will adjust medications as needed. By postoperative day 3, the patient was stable and ready for discharge to a rehab facility.   CONDITION AT DISCHARGE: Stable.   DISCHARGE INSTRUCTIONS: The patient may increase weight-bearing on the effected extremity. Elevate the effected foot or leg on 1 or 2 pillows with the foot higher than the knee. Thigh-high TED hose on both legs, remove 1 hour per eight hour shift. Elevate the heels off the bed. Incentive spirometer every hour while awake. Encourage cough and deep breathing. The patient may resume a regular diet as tolerated. Continue using Polar Care unit maintaining the temp between 40 and 50 degrees. Do not get the dressing or bandage wet or dirty. [facility] if the dressing gets water under it. Leave the dressing on. Call [facility]opedics if you experience any increased bright red bleeding from the incision wound, fever above 101.5 degrees, redness, swelling, or drainage at the incision site. Call [facility] orthopedics if you experience any increased leg pain,  numbness or weakness in hour legs, bowel or bladder symptoms. Physical therapy has been arranged for continuation at rehab facility. Please call [facility] orthopedics for follow-up in 2 weeks.   DISCHARGE MEDICATIONS: Please see list of discharge medications on discharge  instructions.    Please stop the following medications: Hydrochlorothiazide 25 mg 1 tab orally once a day. Please also have the patient's labs rechecked on August 01, 2014.   ____________________________ T. Cranston Neighbor, PA-C tcg:sb D: 07/30/2014 11:46:21 ET T: 07/30/2014 12:06:36 ET JOB#: [id number]  cc: T. Cranston Neighbor, PA-C, <Dictator> Evon Slack [place] ELECTRONICALLY SIGNED 08/02/2014 15:41

## 2014-09-01 DIAGNOSIS — E668 Other obesity: Secondary | ICD-10-CM | POA: Insufficient documentation

## 2014-09-01 DIAGNOSIS — Z0000 Encounter for general adult medical examination without abnormal findings: Secondary | ICD-10-CM | POA: Insufficient documentation

## 2014-09-01 DIAGNOSIS — I10 Essential (primary) hypertension: Secondary | ICD-10-CM | POA: Insufficient documentation

## 2014-09-01 DIAGNOSIS — H409 Unspecified glaucoma: Secondary | ICD-10-CM | POA: Insufficient documentation

## 2014-09-01 DIAGNOSIS — Z96652 Presence of left artificial knee joint: Secondary | ICD-10-CM | POA: Diagnosis not present

## 2014-09-01 DIAGNOSIS — Z6841 Body Mass Index (BMI) 40.0 and over, adult: Secondary | ICD-10-CM | POA: Insufficient documentation

## 2014-09-01 DIAGNOSIS — K219 Gastro-esophageal reflux disease without esophagitis: Secondary | ICD-10-CM | POA: Insufficient documentation

## 2014-09-03 DIAGNOSIS — Z96652 Presence of left artificial knee joint: Secondary | ICD-10-CM | POA: Diagnosis not present

## 2014-09-06 DIAGNOSIS — Z96652 Presence of left artificial knee joint: Secondary | ICD-10-CM | POA: Diagnosis not present

## 2014-09-08 DIAGNOSIS — Z96652 Presence of left artificial knee joint: Secondary | ICD-10-CM | POA: Diagnosis not present

## 2014-09-10 DIAGNOSIS — Z96652 Presence of left artificial knee joint: Secondary | ICD-10-CM | POA: Diagnosis not present

## 2014-09-13 DIAGNOSIS — Z96652 Presence of left artificial knee joint: Secondary | ICD-10-CM | POA: Diagnosis not present

## 2014-09-15 DIAGNOSIS — Z96652 Presence of left artificial knee joint: Secondary | ICD-10-CM | POA: Diagnosis not present

## 2014-09-17 DIAGNOSIS — Z96652 Presence of left artificial knee joint: Secondary | ICD-10-CM | POA: Diagnosis not present

## 2014-09-28 DIAGNOSIS — Z96652 Presence of left artificial knee joint: Secondary | ICD-10-CM | POA: Diagnosis not present

## 2014-10-01 DIAGNOSIS — Z96652 Presence of left artificial knee joint: Secondary | ICD-10-CM | POA: Diagnosis not present

## 2014-10-05 DIAGNOSIS — Z96652 Presence of left artificial knee joint: Secondary | ICD-10-CM | POA: Diagnosis not present

## 2014-10-07 DIAGNOSIS — Z96652 Presence of left artificial knee joint: Secondary | ICD-10-CM | POA: Diagnosis not present

## 2014-10-12 DIAGNOSIS — Z96652 Presence of left artificial knee joint: Secondary | ICD-10-CM | POA: Diagnosis not present

## 2014-10-14 DIAGNOSIS — Z96652 Presence of left artificial knee joint: Secondary | ICD-10-CM | POA: Diagnosis not present

## 2014-10-15 ENCOUNTER — Encounter: Payer: Self-pay | Admitting: Family Medicine

## 2014-10-15 ENCOUNTER — Ambulatory Visit (INDEPENDENT_AMBULATORY_CARE_PROVIDER_SITE_OTHER): Payer: Medicare Other | Admitting: Family Medicine

## 2014-10-15 VITALS — BP 120/84 | HR 72 | Ht 63.0 in | Wt 270.0 lb

## 2014-10-15 DIAGNOSIS — I10 Essential (primary) hypertension: Secondary | ICD-10-CM | POA: Diagnosis not present

## 2014-10-15 DIAGNOSIS — K219 Gastro-esophageal reflux disease without esophagitis: Secondary | ICD-10-CM

## 2014-10-15 MED ORDER — HYDROCHLOROTHIAZIDE 25 MG PO TABS: 25.0000 mg | ORAL_TABLET | Freq: Every day | ORAL | Status: DC

## 2014-10-15 MED ORDER — PANTOPRAZOLE SODIUM 40 MG PO TBEC: 40.0000 mg | DELAYED_RELEASE_TABLET | Freq: Every day | ORAL | Status: DC

## 2014-10-15 MED ORDER — TELMISARTAN 40 MG PO TABS: 40.0000 mg | ORAL_TABLET | Freq: Every day | ORAL | Status: DC

## 2014-10-15 NOTE — Progress Notes (Signed)
Name: Diane Small   MRN: [id number]    DOB: [birth year]   Date:10/15/2014       Progress Note  Subjective  Chief Complaint  Chief Complaint  Patient presents with  . Hypertension  . Gastrophageal Reflux    Hypertension This is a recurrent problem. The current episode started more than 1 year ago. The problem is unchanged. The problem is controlled. Pertinent negatives include no anxiety, blurred vision, chest pain, headaches, malaise/fatigue, neck pain, orthopnea, palpitations, peripheral edema, PND, shortness of breath or sweats. There are no associated agents to hypertension. Risk factors for coronary artery disease include obesity and post-menopausal state. Past treatments include angiotensin blockers and diuretics. The current treatment provides no improvement. There are no compliance problems.  There is no history of angina, kidney disease, CAD/MI, CVA, heart failure, left ventricular hypertrophy, PVD or retinopathy. There is no history of chronic renal disease.  Gastrophageal Reflux She reports no abdominal pain, no belching, no chest pain, no coughing, no dysphagia, no heartburn or no hoarse voice. This is a recurrent problem. The current episode started more than 1 year ago. The problem occurs frequently. The problem has been unchanged. The symptoms are aggravated by lying down. There are no known risk factors. She has tried a PPI and a diet change for the symptoms. The treatment provided mild relief.    No problem-specific assessment & plan notes found for this encounter.   Past Medical History  Diagnosis Date  . GERD (gastroesophageal reflux disease)   . Hypertension   . Anxiety   . Depression     Past Surgical History  Procedure Laterality Date  . Vaginal hysterectomy    . Total knee arthroplasty Left 02/29/2015    Family History  Problem Relation Age of Onset  . Cancer Father   . Heart disease Father   . Cancer Maternal Aunt   . Cancer Paternal Aunt   .  Cancer Paternal Uncle     History   Social History  . Marital Status: Divorced    Spouse Name: N/A  . Number of Children: N/A  . Years of Education: N/A   Occupational History  . Not on file.   Social History Main Topics  . Smoking status: Never Smoker   . Smokeless tobacco: Not on file  . Alcohol Use: No  . Drug Use: No  . Sexual Activity: No   Other Topics Concern  . Not on file   Social History Narrative    No Known Allergies   Review of Systems  Constitutional: Negative for fever and malaise/fatigue.  HENT: Negative for congestion and hoarse voice.   Eyes: Negative for blurred vision.  Respiratory: Negative for cough and shortness of breath.   Cardiovascular: Negative for chest pain, palpitations, orthopnea and PND.  Gastrointestinal: Negative for heartburn, dysphagia, abdominal pain, constipation and blood in stool.  Genitourinary: Negative for dysuria, urgency, frequency and hematuria.  Musculoskeletal: Negative for neck pain.  Skin: Negative for rash.  Neurological: Negative for dizziness, tingling, tremors and headaches.  Endo/Heme/Allergies: Negative for environmental allergies and polydipsia. Does not bruise/bleed easily.  Psychiatric/Behavioral: Positive for depression. Negative for suicidal ideas and hallucinations. The patient is nervous/anxious.        Grief stage     Objective  Filed Vitals:   10/15/14 0856  BP: 120/84  Pulse: 72  Height: 5' 3"  (1.6 m)  Weight: 270 lb (122.471 kg)    Physical Exam  Constitutional: She is well-developed, well-nourished, and in no  distress. No distress.  HENT:  Head: Normocephalic and atraumatic.  Right Ear: External ear normal.  Left Ear: External ear normal.  Nose: Nose normal.  Mouth/Throat: Oropharynx is clear and moist.  Eyes: Conjunctivae and EOM are normal. Pupils are equal, round, and reactive to light. Right eye exhibits no discharge. Left eye exhibits no discharge.  Neck: Normal range of motion.  Neck supple. No JVD present. No thyromegaly present.  Cardiovascular: Normal rate, regular rhythm, normal heart sounds and intact distal pulses.  Exam reveals no gallop and no friction rub.   No murmur heard. Pulmonary/Chest: Effort normal and breath sounds normal.  Abdominal: Soft. Bowel sounds are normal. She exhibits no mass. There is no tenderness. There is no guarding.  Musculoskeletal: Normal range of motion. She exhibits no edema.  Lymphadenopathy:    She has no cervical adenopathy.  Neurological: She is alert. She has normal reflexes.  Skin: Skin is warm and dry. She is not diaphoretic.  Psychiatric: Mood and affect normal.      Recent Results (from the past 2160 hour(s))  Surgical pathology     Status: None   Collection Time: 07/27/14 12:00 AM  Result Value Ref Range   SURGICAL PATHOLOGY      Surgical Procedure CASE: [id number] PATIENT: Loreda Small Surgical Pathology Report     SPECIMEN SUBMITTED: A. Knee left, fragments  CLINICAL HISTORY: None provided  PRE-OPERATIVE DIAGNOSIS: Primary osteoarthritis  POST-OPERATIVE DIAGNOSIS: Same/ Left total knee Arthroplasty     DIAGNOSIS: A. LEFT KNEE FRAGMENTS; LEFT TOTAL KNEE ARTHROPLASTY: - MILD TO MODERATE DEGENERATIVE OSTEOARTHROSIS.   GROSS DESCRIPTION: A. The specimen is received in a formalin-filled container labeled with the patient's name and left knee fragments.  Measurement: Aggregate 13.8 x 10 x 5 cm Description: Pink red to tan bone fragments with small amounts of soft tissue Articular surfaces: Mild to moderately eroded and roughened Gross evidence of gout: No Other findings: None  Block summary: 1 - representative sections, post decalcification    Final Diagnosis performed by Delorse Lek, MD.  Electronically signed 07/28/2014 1:32:56PM    The electronic signature in dicates that the named Attending Pathologist has evaluated the specimen  Technical component performed at  [facility], [address] [place], [place] [address] Lab: [phone] Dir: Darrick Penna. Evette Doffing, MD  Professional component performed at [facility], [facility], [place], [place], [place] [address] Lab: [phone] Dir: Dellia Nims. Rubinas, MD    Hemoglobin     Status: Abnormal   Collection Time: 07/28/14  4:31 AM  Result Value Ref Range   HGB 10.7 (L) 12.0-16.0 g/dL  Basic metabolic panel     Status: Abnormal   Collection Time: 07/28/14  4:31 AM  Result Value Ref Range   Glucose, CSF DNP mg/dL    Comment: 65-99 NOTE: New Reference Range  07/06/14    BUN 13 mg/dL    Comment: 6-20 NOTE: New Reference Range  07/06/14    Creatinine 1.01 (H) mg/dL    Comment: 0.44-1.00 NOTE: New Reference Range  07/06/14    Sodium, Urine Random DNP mmol/L    Comment: 135-145 NOTE: New Reference Range  07/06/14    Potassium, Urine Random DNP mmol/L    Comment: 3.5-5.1 NOTE: New Reference Range  07/06/14    Chloride, Urine Random DNP mmol/L    Comment: 101-111 NOTE: New Reference Range  07/06/14    Co2 32 mmol/L    Comment: 22-32 NOTE: New Reference Range  07/06/14    Calcium, Total  8.1 (L) mg/dL    Comment: 8.9-10.3 NOTE: New Reference Range  07/06/14    Anion Gap 6 (L) 7-16   EGFR (African American) >60    EGFR (Non-African Amer.) >60     Comment: eGFR values <41m/min/1.73 m2 may be an indication of chronic kidney disease (CKD). Calculated eGFR is useful in patients with stable renal function. The eGFR calculation will not be reliable in acutely ill patients when serum creatinine is changing rapidly. It is not useful in patients on dialysis. The eGFR calculation may not be applicable to patients at the low and high extremes of body sizes, pregnant women, and vegetarians.    Glucose 136 (H) mg/dL    Comment: 65-99 NOTE: New Reference Range  07/06/14    Sodium 132 (L) mmol/L    Comment: 135-145 NOTE: New Reference Range  07/06/14    Potassium  3.6 mmol/L    Comment: 3.5-5.1 NOTE: New Reference Range  07/06/14    Chloride 94 (L) mmol/L    Comment: 101-111 NOTE: New Reference Range  07/06/14   Platelet count     Status: None   Collection Time: 07/28/14  4:31 AM  Result Value Ref Range   Platelet 293 150-440 x10 3/mm 3  CBC     Status: Abnormal   Collection Time: 07/29/14  6:08 AM  Result Value Ref Range   WBC 10.9 3.6-11.0 x10 3/mm 3   RBC 3.48 (L) 3.80-5.20 X10 6/mm 3   HGB 9.9 (L) 12.0-16.0 g/dL   HCT 29.2 (L) 35.0-47.0 %   MCV 84 80-100 fL   MCH 28.4 26.0-34.0 pg   MCHC 33.9 32.0-36.0 g/dL   RDW 14.9 (H) 11.5-14.5 %   Platelet 267 150-440 x10 3/mm 3  Basic metabolic panel     Status: Abnormal   Collection Time: 07/29/14  6:08 AM  Result Value Ref Range   Glucose, CSF DNP mg/dL    Comment: 65-99 NOTE: New Reference Range  07/06/14    BUN 11 mg/dL    Comment: 6-20 NOTE: New Reference Range  07/06/14    Creatinine 0.80 mg/dL    Comment: 0.44-1.00 NOTE: New Reference Range  07/06/14    Sodium, Urine Random DNP mmol/L    Comment: 135-145 NOTE: New Reference Range  07/06/14    Potassium, Urine Random DNP mmol/L    Comment: 3.5-5.1 NOTE: New Reference Range  07/06/14    Chloride, Urine Random DNP mmol/L    Comment: 101-111 NOTE: New Reference Range  07/06/14    Co2 30 mmol/L    Comment: 22-32 NOTE: New Reference Range  07/06/14    Calcium, Total 7.5 (L) mg/dL    Comment: 8.9-10.3 NOTE: New Reference Range  07/06/14    Anion Gap 2 (L) 7-16   EGFR (African American) >60    EGFR (Non-African Amer.) >60     Comment: eGFR values <656mmin/1.73 m2 may be an indication of chronic kidney disease (CKD). Calculated eGFR is useful in patients with stable renal function. The eGFR calculation will not be reliable in acutely ill patients when serum creatinine is changing rapidly. It is not useful in patients on dialysis. The eGFR calculation may not be applicable to patients at the low and high  extremes of body sizes, pregnant women, and vegetarians.    Glucose 121 (H) mg/dL    Comment: 65-99 NOTE: New Reference Range  07/06/14    Sodium 129 (L) mmol/L    Comment: 135-145 NOTE: New Reference Range  07/06/14  Potassium 3.7 mmol/L    Comment: 3.5-5.1 NOTE: New Reference Range  07/06/14    Chloride 97 (L) mmol/L    Comment: 101-111 NOTE: New Reference Range  38/10/17   Basic metabolic panel     Status: Abnormal   Collection Time: 07/30/14  6:17 AM  Result Value Ref Range   Glucose, CSF DNP mg/dL    Comment: 65-99 NOTE: New Reference Range  07/06/14    BUN 13 mg/dL    Comment: 6-20 NOTE: New Reference Range  07/06/14    Creatinine 0.78 mg/dL    Comment: 0.44-1.00 NOTE: New Reference Range  07/06/14    Sodium, Urine Random DNP mmol/L    Comment: 135-145 NOTE: New Reference Range  07/06/14    Potassium, Urine Random DNP mmol/L    Comment: 3.5-5.1 NOTE: New Reference Range  07/06/14    Chloride, Urine Random DNP mmol/L    Comment: 101-111 NOTE: New Reference Range  07/06/14    Co2 28 mmol/L    Comment: 22-32 NOTE: New Reference Range  07/06/14    Calcium, Total 7.4 (L) mg/dL    Comment: 8.9-10.3 NOTE: New Reference Range  07/06/14    Anion Gap 6 (L) 7-16   EGFR (African American) >60    EGFR (Non-African Amer.) >60     Comment: eGFR values <24m/min/1.73 m2 may be an indication of chronic kidney disease (CKD). Calculated eGFR is useful in patients with stable renal function. The eGFR calculation will not be reliable in acutely ill patients when serum creatinine is changing rapidly. It is not useful in patients on dialysis. The eGFR calculation may not be applicable to patients at the low and high extremes of body sizes, pregnant women, and vegetarians.  - K-HEMOLYSIS AT THIS LEVEL MAY AFFECT THE RESULT    Glucose 123 (H) mg/dL    Comment: 65-99 NOTE: New Reference Range  07/06/14    Sodium 130 (L) mmol/L    Comment:  135-145 NOTE: New Reference Range  07/06/14    Potassium 3.8 mmol/L    Comment: 3.5-5.1 NOTE: New Reference Range  07/06/14    Chloride 96 (L) mmol/L    Comment: 101-111 NOTE: New Reference Range  07/06/14   TSH     Status: None   Collection Time: 07/30/14  6:47 AM  Result Value Ref Range   Thyroid Stimulating Horm 3.768 uIU/mL    Comment: 0.350-4.500 NOTE: New Reference Range  07/06/14   Lipid panel     Status: None   Collection Time: 07/30/14  6:47 AM  Result Value Ref Range   Cholesterol 123 mg/dL    Comment: 0-200 NOTE: New Reference Range  07/06/14    Triglycerides 59 mg/dL    Comment: 0-149 NOTE: New Reference Range  07/06/14    HDL Cholesterol 54 mg/dL    Comment: 40-1000 NOTE: New Reference Range:  07/06/14    VLDL Cholesterol, Calc 12 mg/dL    Comment: 0-40 NOTE: New Reference Range  07/06/14    Ldl Cholesterol, Calc 57 mg/dL    Comment: 0-99 NOTE: New Reference Range:  07/06/14   Hepatic Function Panel A ([facility])     Status: Abnormal   Collection Time: 07/30/14  6:47 AM  Result Value Ref Range   SGOT(AST) 45 (H) U/L    Comment: 15-41 NOTE: New Reference Range  07/06/14    SGPT (ALT) 22 U/L    Comment: 14-54 NOTE: New Reference Range  07/06/14    Alkaline Phosphatase 49 U/L  Comment: 38-126 NOTE: New Reference Range  07/06/14    Albumin 2.8 (L) g/dL    Comment: 3.5-5.0 NOTE: New reference range  07/06/14    Total Protein 5.9 (L) g/dL    Comment: 6.5-8.1 NOTE: New Reference Range  07/06/14    Bilirubin,Total 0.6 mg/dL    Comment: 0.3-1.2 NOTE: New Reference Range  07/06/14    Bilirubin, Direct 0.2 mg/dL    Comment: 0.1-0.5 NOTE: New Reference Range  07/06/14    Indirect Bilirubin 0.4      Assessment & Plan  Problem List Items Addressed This Visit      Cardiovascular and Mediastinum   BP (high blood pressure) - Primary   Relevant Medications   hydrochlorothiazide (HYDRODIURIL) 25 MG tablet   telmisartan  (MICARDIS) 40 MG tablet   Other Relevant Orders   Renal Function Panel     Digestive   Acid reflux   Relevant Medications   pantoprazole (PROTONIX) 40 MG tablet        Dr. Delene Morais [facility]  10/15/2014

## 2014-10-16 LAB — RENAL FUNCTION PANEL
Albumin: 4.2 g/dL (ref 3.5–5.5)
BUN/Creatinine Ratio: 14 (ref 9–23)
BUN: 13 mg/dL (ref 6–24)
CO2: 25 mmol/L (ref 18–29)
CREATININE: 0.91 mg/dL (ref 0.57–1.00)
Calcium: 9.1 mg/dL (ref 8.7–10.2)
GFR calc Af Amer: 80 mL/min/{1.73_m2} (ref >59)
GFR calc non Af Amer: 70 mL/min/{1.73_m2} (ref >59)
GLUCOSE: 92 mg/dL (ref 65–99)
Phosphorus: 4.2 mg/dL (ref 2.5–4.5)

## 2014-10-19 DIAGNOSIS — Z96652 Presence of left artificial knee joint: Secondary | ICD-10-CM | POA: Diagnosis not present

## 2014-10-21 DIAGNOSIS — Z96652 Presence of left artificial knee joint: Secondary | ICD-10-CM | POA: Diagnosis not present

## 2015-01-06 DIAGNOSIS — H4011X2 Primary open-angle glaucoma, moderate stage: Secondary | ICD-10-CM | POA: Diagnosis not present

## 2015-01-13 DIAGNOSIS — H4011X2 Primary open-angle glaucoma, moderate stage: Secondary | ICD-10-CM | POA: Diagnosis not present

## 2015-02-14 ENCOUNTER — Other Ambulatory Visit: Payer: Self-pay

## 2015-04-12 ENCOUNTER — Other Ambulatory Visit: Payer: Self-pay | Admitting: Family Medicine

## 2015-04-18 ENCOUNTER — Ambulatory Visit (INDEPENDENT_AMBULATORY_CARE_PROVIDER_SITE_OTHER): Payer: Medicare Other | Admitting: Family Medicine

## 2015-04-18 ENCOUNTER — Encounter: Payer: Self-pay | Admitting: Family Medicine

## 2015-04-18 VITALS — BP 140/90 | HR 80 | Ht 63.0 in | Wt 250.0 lb

## 2015-04-18 DIAGNOSIS — Z0000 Encounter for general adult medical examination without abnormal findings: Secondary | ICD-10-CM | POA: Insufficient documentation

## 2015-04-18 DIAGNOSIS — N3090 Cystitis, unspecified without hematuria: Secondary | ICD-10-CM | POA: Diagnosis not present

## 2015-04-18 DIAGNOSIS — I10 Essential (primary) hypertension: Secondary | ICD-10-CM | POA: Insufficient documentation

## 2015-04-18 DIAGNOSIS — Z1239 Encounter for other screening for malignant neoplasm of breast: Secondary | ICD-10-CM

## 2015-04-18 DIAGNOSIS — K219 Gastro-esophageal reflux disease without esophagitis: Secondary | ICD-10-CM | POA: Diagnosis not present

## 2015-04-18 LAB — POCT URINALYSIS DIPSTICK
BILIRUBIN UA: NEGATIVE
Glucose, UA: NEGATIVE
KETONES UA: NEGATIVE
Nitrite, UA: NEGATIVE
PH UA: 6
Protein, UA: NEGATIVE
RBC UA: NEGATIVE
Spec Grav, UA: 1.02
Urobilinogen, UA: 0.2

## 2015-04-18 LAB — HEMOCCULT GUIAC POC 1CARD (OFFICE): Fecal Occult Blood, POC: NEGATIVE

## 2015-04-18 MED ORDER — HYDROCHLOROTHIAZIDE 25 MG PO TABS: ORAL_TABLET | ORAL | Status: DC

## 2015-04-18 MED ORDER — CIPROFLOXACIN HCL 250 MG PO TABS: 250.0000 mg | ORAL_TABLET | Freq: Two times a day (BID) | ORAL | Status: DC

## 2015-04-18 MED ORDER — PANTOPRAZOLE SODIUM 40 MG PO TBEC: DELAYED_RELEASE_TABLET | ORAL | Status: DC

## 2015-04-18 NOTE — Progress Notes (Signed)
Name: Diane Small   MRN: [id number]    DOB: [birth year]   Date:04/18/2015       Progress Note  Subjective  Chief Complaint  Chief Complaint  Patient presents with  . Annual Exam  . Gastroesophageal Reflux  . Hypertension    Gastroesophageal Reflux She reports no abdominal pain, no belching, no chest pain, no choking, no coughing, no dysphagia, no early satiety, no globus sensation, no heartburn, no hoarse voice, no nausea, no sore throat, no stridor, no tooth decay, no water brash or no wheezing. This is a chronic problem. The current episode started more than 1 year ago. The problem occurs frequently. The problem has been gradually improving. The symptoms are aggravated by certain foods. Pertinent negatives include no anemia, fatigue, melena, muscle weakness, orthopnea or weight loss. There are no known risk factors. She has tried a PPI for the symptoms. The treatment provided moderate relief.  Hypertension This is a chronic problem. The current episode started more than 1 year ago. The problem has been gradually improving since onset. Pertinent negatives include no anxiety, blurred vision, chest pain, headaches, malaise/fatigue, neck pain, orthopnea, palpitations, peripheral edema, PND, shortness of breath or sweats. Risk factors for coronary artery disease include dyslipidemia. Past treatments include angiotensin blockers. The current treatment provides mild improvement. There are no compliance problems.  There is no history of angina, kidney disease, CAD/MI, CVA, heart failure, left ventricular hypertrophy, PVD, renovascular disease or retinopathy. There is no history of chronic renal disease or a hypertension causing med.  Urinary Tract Infection  This is a new problem. The current episode started yesterday. The problem occurs intermittently. The problem has been gradually worsening. There has been no fever. Associated symptoms include chills, frequency and urgency. Pertinent  negatives include no discharge, flank pain, hematuria, nausea or sweats.    No problem-specific assessment & plan notes found for this encounter.   Past Medical History  Diagnosis Date  . GERD (gastroesophageal reflux disease)   . Hypertension   . Anxiety   . Depression     Past Surgical History  Procedure Laterality Date  . Vaginal hysterectomy    . Total knee arthroplasty Left 02/29/2015    Family History  Problem Relation Age of Onset  . Cancer Father   . Heart disease Father   . Cancer Maternal Aunt   . Cancer Paternal Aunt   . Cancer Paternal Uncle     Social History   Social History  . Marital Status: Divorced    Spouse Name: N/A  . Number of Children: N/A  . Years of Education: N/A   Occupational History  . Not on file.   Social History Main Topics  . Smoking status: Never Smoker   . Smokeless tobacco: Not on file  . Alcohol Use: No  . Drug Use: No  . Sexual Activity: No   Other Topics Concern  . Not on file   Social History Narrative    No Known Allergies   Review of Systems  Constitutional: Positive for chills. Negative for fever, weight loss, malaise/fatigue and fatigue.  HENT: Negative for ear discharge, ear pain, hoarse voice and sore throat.   Eyes: Negative for blurred vision.  Respiratory: Negative for cough, sputum production, choking, shortness of breath and wheezing.   Cardiovascular: Negative for chest pain, palpitations, orthopnea, leg swelling and PND.  Gastrointestinal: Negative for heartburn, dysphagia, nausea, abdominal pain, diarrhea, constipation, blood in stool and melena.  Genitourinary: Positive for urgency and frequency. Negative  for dysuria, hematuria and flank pain.  Musculoskeletal: Negative for myalgias, back pain, joint pain, muscle weakness and neck pain.  Skin: Negative for rash.  Neurological: Negative for dizziness, tingling, sensory change, focal weakness and headaches.  Endo/Heme/Allergies: Negative for  environmental allergies and polydipsia. Does not bruise/bleed easily.  Psychiatric/Behavioral: Negative for depression and suicidal ideas. The patient is not nervous/anxious and does not have insomnia.      Objective  Filed Vitals:   04/18/15 0956  BP: 140/90  Pulse: 80  Height: 5\' 3"  (1.6 m)  Weight: 250 lb (113.399 kg)    Physical Exam  Constitutional: She is well-developed, well-nourished, and in no distress. No distress.  HENT:  Head: Normocephalic and atraumatic.  Right Ear: External ear normal.  Left Ear: External ear normal.  Nose: Nose normal.  Mouth/Throat: Oropharynx is clear and moist.  Eyes: Conjunctivae and EOM are normal. Pupils are equal, round, and reactive to light. Right eye exhibits no discharge. Left eye exhibits no discharge.  Neck: Normal range of motion. Neck supple. No JVD present. No thyromegaly present.  Cardiovascular: Normal rate, regular rhythm, normal heart sounds and intact distal pulses.  Exam reveals no gallop and no friction rub.   No murmur heard. Pulmonary/Chest: Effort normal and breath sounds normal. No respiratory distress. She has no wheezes. She has no rales. She exhibits no tenderness. Right breast exhibits no inverted nipple, no mass, no nipple discharge, no skin change and no tenderness. Left breast exhibits no inverted nipple, no mass, no nipple discharge, no skin change and no tenderness. Breasts are symmetrical.  Abdominal: Soft. Bowel sounds are normal. She exhibits no mass. There is no tenderness. There is no rebound and no guarding.  Genitourinary: Vagina normal, right adnexa normal and left adnexa normal. Guaiac negative stool. No vaginal discharge found.  Musculoskeletal: Normal range of motion. She exhibits no edema.  Lymphadenopathy:    She has no cervical adenopathy.  Neurological: She is alert. She has normal reflexes.  Skin: Skin is warm and dry. She is not diaphoretic.  Psychiatric: Mood and affect normal.  Nursing note and  vitals reviewed.     Assessment & Plan  Problem List Items Addressed This Visit      Cardiovascular and Mediastinum   Essential hypertension   Relevant Medications   hydrochlorothiazide (HYDRODIURIL) 25 MG tablet   Other Relevant Orders   Renal Function Panel     Digestive   Esophageal reflux   Relevant Medications   pantoprazole (PROTONIX) 40 MG tablet     Other   Annual physical exam - Primary   Relevant Orders   Lipid Profile   POCT Occult Blood Stool (Completed)   POCT Urinalysis Dipstick (Completed)   Pap IG (Image Guided)    Other Visit Diagnoses    Cystitis        Relevant Medications    ciprofloxacin (CIPRO) 250 MG tablet    Breast cancer screening        Relevant Orders    MM Digital Screening         Dr. Hayden Rasmusseneanna Jones [facility]  04/18/2015

## 2015-04-19 LAB — RENAL FUNCTION PANEL
Albumin: 3.9 g/dL (ref 3.5–5.5)
BUN/Creatinine Ratio: 12 (ref 9–23)
BUN: 12 mg/dL (ref 6–24)
CO2: 27 mmol/L (ref 18–29)
CREATININE: 0.97 mg/dL (ref 0.57–1.00)
Calcium: 9.3 mg/dL (ref 8.7–10.2)
Chloride: 95 mmol/L — ABNORMAL LOW (ref 96–106)
GFR calc Af Amer: 74 mL/min/{1.73_m2} (ref >59)
GFR calc non Af Amer: 64 mL/min/{1.73_m2} (ref >59)
Glucose: 91 mg/dL (ref 65–99)
PHOSPHORUS: 4.5 mg/dL (ref 2.5–4.5)
Potassium: 4.1 mmol/L (ref 3.5–5.2)
SODIUM: 138 mmol/L (ref 134–144)

## 2015-04-19 LAB — LIPID PANEL
CHOL/HDL RATIO: 3.6 ratio (ref 0.0–4.4)
Cholesterol, Total: 184 mg/dL (ref 100–199)
HDL: 51 mg/dL (ref >39)
LDL CALC: 107 mg/dL — AB (ref 0–99)
TRIGLYCERIDES: 130 mg/dL (ref 0–149)
VLDL Cholesterol Cal: 26 mg/dL (ref 5–40)

## 2015-04-20 LAB — PAP IG (IMAGE GUIDED): PAP SMEAR COMMENT: 0

## 2015-05-03 ENCOUNTER — Ambulatory Visit
Admission: RE | Admit: 2015-05-03 | Discharge: 2015-05-03 | Disposition: A | Discharge status: Home / Self Care | Payer: Medicare Other | Source: Ambulatory Visit | Attending: Family Medicine | Admitting: Family Medicine

## 2015-05-03 DIAGNOSIS — Z1239 Encounter for other screening for malignant neoplasm of breast: Secondary | ICD-10-CM

## 2015-05-03 DIAGNOSIS — Z1231 Encounter for screening mammogram for malignant neoplasm of breast: Secondary | ICD-10-CM | POA: Diagnosis not present

## 2015-06-11 ENCOUNTER — Other Ambulatory Visit: Payer: Self-pay | Admitting: Family Medicine

## 2015-07-12 DIAGNOSIS — H401132 Primary open-angle glaucoma, bilateral, moderate stage: Secondary | ICD-10-CM | POA: Diagnosis not present

## 2015-08-02 ENCOUNTER — Other Ambulatory Visit: Payer: Self-pay | Admitting: Family Medicine

## 2015-08-03 DIAGNOSIS — Z96652 Presence of left artificial knee joint: Secondary | ICD-10-CM | POA: Diagnosis not present

## 2015-09-27 ENCOUNTER — Other Ambulatory Visit: Payer: Self-pay | Admitting: Family Medicine

## 2015-10-17 ENCOUNTER — Ambulatory Visit (INDEPENDENT_AMBULATORY_CARE_PROVIDER_SITE_OTHER): Payer: Medicare Other | Admitting: Family Medicine

## 2015-10-17 ENCOUNTER — Encounter: Payer: Self-pay | Admitting: Family Medicine

## 2015-10-17 ENCOUNTER — Ambulatory Visit
Admission: RE | Admit: 2015-10-17 | Discharge: 2015-10-17 | Disposition: A | Discharge status: Home / Self Care | Payer: Medicare Other | Source: Ambulatory Visit | Attending: Family Medicine | Admitting: Family Medicine

## 2015-10-17 VITALS — BP 140/92 | HR 64 | Ht 63.0 in | Wt 258.0 lb

## 2015-10-17 DIAGNOSIS — M419 Scoliosis, unspecified: Secondary | ICD-10-CM | POA: Insufficient documentation

## 2015-10-17 DIAGNOSIS — M5137 Other intervertebral disc degeneration, lumbosacral region: Secondary | ICD-10-CM | POA: Diagnosis not present

## 2015-10-17 DIAGNOSIS — K219 Gastro-esophageal reflux disease without esophagitis: Secondary | ICD-10-CM

## 2015-10-17 DIAGNOSIS — E785 Hyperlipidemia, unspecified: Secondary | ICD-10-CM

## 2015-10-17 DIAGNOSIS — I10 Essential (primary) hypertension: Secondary | ICD-10-CM | POA: Diagnosis not present

## 2015-10-17 DIAGNOSIS — M545 Low back pain: Secondary | ICD-10-CM | POA: Diagnosis not present

## 2015-10-17 DIAGNOSIS — M4316 Spondylolisthesis, lumbar region: Secondary | ICD-10-CM | POA: Diagnosis not present

## 2015-10-17 DIAGNOSIS — M51379 Other intervertebral disc degeneration, lumbosacral region without mention of lumbar back pain or lower extremity pain: Secondary | ICD-10-CM | POA: Insufficient documentation

## 2015-10-17 MED ORDER — TELMISARTAN 40 MG PO TABS
ORAL_TABLET | ORAL | Status: DC
Start: 2015-10-17 — End: 2016-06-07

## 2015-10-17 MED ORDER — PANTOPRAZOLE SODIUM 40 MG PO TBEC: DELAYED_RELEASE_TABLET | ORAL | Status: DC

## 2015-10-17 MED ORDER — HYDROCHLOROTHIAZIDE 25 MG PO TABS: ORAL_TABLET | ORAL | Status: DC

## 2015-10-17 NOTE — Progress Notes (Signed)
Name: Diane Small   MRN: [id number]    DOB: 11/21/1955   Date:10/17/2015       Progress Note  Subjective  Chief Complaint  Chief Complaint  Patient presents with  . Hypertension  . Gastroesophageal Reflux    Hypertension This is a chronic problem. The current episode started more than 1 year ago. The problem has been gradually improving since onset. The problem is controlled. Pertinent negatives include no anxiety, blurred vision, chest pain, headaches, malaise/fatigue, neck pain, orthopnea, palpitations, peripheral edema, PND, shortness of breath or sweats. There are no associated agents to hypertension. Risk factors for coronary artery disease include obesity and dyslipidemia. Past treatments include diuretics and angiotensin blockers. The current treatment provides mild improvement. There are no compliance problems.  There is no history of angina, kidney disease, CAD/MI, CVA, heart failure, left ventricular hypertrophy, PVD, renovascular disease or retinopathy. There is no history of chronic renal disease or a hypertension causing med.  Gastroesophageal Reflux She reports no abdominal pain, no belching, no chest pain, no choking, no coughing, no dysphagia, no early satiety, no globus sensation, no heartburn, no hoarse voice, no nausea, no sore throat, no stridor, no tooth decay, no water brash or no wheezing. This is a chronic problem. The problem occurs frequently. The problem has been gradually improving. The symptoms are aggravated by certain foods. Pertinent negatives include no anemia, fatigue, melena, muscle weakness, orthopnea or weight loss. She has tried nothing for the symptoms. The treatment provided mild relief. Past procedures do not include an abdominal ultrasound, an EGD, esophageal manometry, esophageal pH monitoring, H. pylori antibody titer or a UGI.  Back Pain This is a new problem. The current episode started 1 to 4 weeks ago. The problem occurs intermittently. The  problem has been waxing and waning since onset. The quality of the pain is described as aching. The pain does not radiate. Pertinent negatives include no abdominal pain, chest pain, dysuria, fever, headaches, tingling or weight loss.    No problem-specific assessment & plan notes found for this encounter.   Past Medical History  Diagnosis Date  . GERD (gastroesophageal reflux disease)   . Hypertension   . Anxiety   . Depression     Past Surgical History  Procedure Laterality Date  . Vaginal hysterectomy    . Total knee arthroplasty Left 02/29/2015    Family History  Problem Relation Age of Onset  . Cancer Father   . Heart disease Father   . Cancer Maternal Aunt   . Breast cancer Maternal Aunt 40  . Cancer Paternal Aunt   . Breast cancer Paternal Aunt [phone]  . Cancer Paternal Uncle   . Breast cancer Sister [id number]    Social History   Social History  . Marital Status: Divorced    Spouse Name: N/A  . Number of Children: N/A  . Years of Education: N/A   Occupational History  . Not on file.   Social History Main Topics  . Smoking status: Never Smoker   . Smokeless tobacco: Not on file  . Alcohol Use: No  . Drug Use: No  . Sexual Activity: No   Other Topics Concern  . Not on file   Social History Narrative    No Known Allergies   Review of Systems  Constitutional: Negative for fever, chills, weight loss, malaise/fatigue and fatigue.  HENT: Negative for ear discharge, ear pain, hoarse voice and sore throat.   Eyes: Negative for blurred vision.  Respiratory: Negative for  cough, sputum production, choking, shortness of breath and wheezing.   Cardiovascular: Negative for chest pain, palpitations, orthopnea, leg swelling and PND.  Gastrointestinal: Negative for heartburn, dysphagia, nausea, abdominal pain, diarrhea, constipation, blood in stool and melena.  Genitourinary: Negative for dysuria, urgency, frequency and hematuria.  Musculoskeletal: Positive for back pain.  Negative for myalgias, joint pain, muscle weakness and neck pain.  Skin: Negative for rash.  Neurological: Negative for dizziness, tingling, sensory change, focal weakness and headaches.  Endo/Heme/Allergies: Negative for environmental allergies and polydipsia. Does not bruise/bleed easily.  Psychiatric/Behavioral: Negative for depression and suicidal ideas. The patient is not nervous/anxious and does not have insomnia.      Objective  Filed Vitals:   10/17/15 1018  BP: 140/92  Pulse: 64  Height:  (1.6 m)  Weight: 258 lb (117.028 kg)    Physical Exam  Constitutional: She is well-developed, well-nourished, and in no distress. No distress.  HENT:  Head: Normocephalic and atraumatic.  Right Ear: External ear normal.  Left Ear: External ear normal.  Nose: Nose normal.  Mouth/Throat: Oropharynx is clear and moist.  Eyes: Conjunctivae and EOM are normal. Pupils are equal, round, and reactive to light. Right eye exhibits no discharge. Left eye exhibits no discharge.  Neck: Normal range of motion. Neck supple. No JVD present. No thyromegaly present.  Cardiovascular: Normal rate, regular rhythm, normal heart sounds and intact distal pulses.  Exam reveals no gallop and no friction rub.   No murmur heard. Pulmonary/Chest: Effort normal and breath sounds normal.  Abdominal: Soft. Bowel sounds are normal. She exhibits no mass. There is no tenderness. There is no guarding.  Musculoskeletal: Normal range of motion. She exhibits no edema.       Lumbar back: She exhibits spasm.  Lymphadenopathy:    She has no cervical adenopathy.  Neurological: She is alert. She has normal sensation, normal strength and normal reflexes. She has a normal Straight Leg Raise Test. Gait normal.  Skin: Skin is warm and dry. She is not diaphoretic.  Psychiatric: Mood and affect normal.  Nursing note and vitals reviewed.     Assessment & Plan  Problem List Items Addressed This Visit      Cardiovascular  and Mediastinum   Essential hypertension - Primary   Relevant Medications   hydrochlorothiazide (HYDRODIURIL) 25 MG tablet   telmisartan (MICARDIS) 40 MG tablet   Other Relevant Orders   Renal Function Panel     Digestive   Acid reflux   Relevant Medications   pantoprazole (PROTONIX) 40 MG tablet     Musculoskeletal and Integument   DDD (degenerative disc disease), lumbosacral   Relevant Orders   DG Lumbar Spine Complete    Other Visit Diagnoses    Hyperlipidemia        Relevant Medications    hydrochlorothiazide (HYDRODIURIL) 25 MG tablet    telmisartan (MICARDIS) 40 MG tablet    Other Relevant Orders    Lipid Profile         Dr. [facility]  10/17/2015

## 2015-10-18 ENCOUNTER — Other Ambulatory Visit: Payer: Self-pay

## 2015-10-18 LAB — RENAL FUNCTION PANEL
Albumin: 4.1 g/dL (ref 3.5–5.5)
BUN / CREAT RATIO: 14 (ref 9–23)
BUN: 14 mg/dL (ref 6–24)
CALCIUM: 9.4 mg/dL (ref 8.7–10.2)
CO2: 24 mmol/L (ref 18–29)
CREATININE: 0.97 mg/dL (ref 0.57–1.00)
Chloride: 99 mmol/L (ref 96–106)
GFR, EST AFRICAN AMERICAN: 74 mL/min/{1.73_m2} (ref >59)
GFR, EST NON AFRICAN AMERICAN: 64 mL/min/{1.73_m2} (ref >59)
Glucose: 90 mg/dL (ref 65–99)
Phosphorus: 3.7 mg/dL (ref 2.5–4.5)
Potassium: 4.5 mmol/L (ref 3.5–5.2)
SODIUM: 141 mmol/L (ref 134–144)

## 2015-10-18 LAB — LIPID PANEL
CHOL/HDL RATIO: 3.7 ratio (ref 0.0–4.4)
Cholesterol, Total: 216 mg/dL — ABNORMAL HIGH (ref 100–199)
HDL: 59 mg/dL (ref >39)
LDL CALC: 131 mg/dL — AB (ref 0–99)
Triglycerides: 129 mg/dL (ref 0–149)
VLDL CHOLESTEROL CAL: 26 mg/dL (ref 5–40)

## 2016-02-10 DIAGNOSIS — H401132 Primary open-angle glaucoma, bilateral, moderate stage: Secondary | ICD-10-CM | POA: Diagnosis not present

## 2016-02-17 DIAGNOSIS — H401132 Primary open-angle glaucoma, bilateral, moderate stage: Secondary | ICD-10-CM | POA: Diagnosis not present

## 2016-04-18 ENCOUNTER — Ambulatory Visit (INDEPENDENT_AMBULATORY_CARE_PROVIDER_SITE_OTHER): Payer: Medicare Other | Admitting: Family Medicine

## 2016-04-18 ENCOUNTER — Encounter: Payer: Self-pay | Admitting: Family Medicine

## 2016-04-18 VITALS — BP 120/80 | HR 76 | Ht 63.0 in | Wt 255.0 lb

## 2016-04-18 DIAGNOSIS — M5137 Other intervertebral disc degeneration, lumbosacral region: Secondary | ICD-10-CM

## 2016-04-18 DIAGNOSIS — E785 Hyperlipidemia, unspecified: Secondary | ICD-10-CM | POA: Diagnosis not present

## 2016-04-18 DIAGNOSIS — K219 Gastro-esophageal reflux disease without esophagitis: Secondary | ICD-10-CM

## 2016-04-18 DIAGNOSIS — Z1231 Encounter for screening mammogram for malignant neoplasm of breast: Secondary | ICD-10-CM

## 2016-04-18 DIAGNOSIS — I10 Essential (primary) hypertension: Secondary | ICD-10-CM | POA: Diagnosis not present

## 2016-04-18 DIAGNOSIS — Z0000 Encounter for general adult medical examination without abnormal findings: Secondary | ICD-10-CM

## 2016-04-18 DIAGNOSIS — Z1239 Encounter for other screening for malignant neoplasm of breast: Secondary | ICD-10-CM

## 2016-04-18 NOTE — Progress Notes (Signed)
Name: Diane Small   MRN: [id number]    DOB: [birth year]   Date:04/18/2016       Progress Note  Subjective  Chief Complaint  Chief Complaint  Patient presents with  . Annual Exam    no pap- needs mammo sched for after Jan 3    Patient presents for annual physical exam.   Hypertension  This is a chronic problem. The current episode started more than 1 year ago. The problem is controlled. Pertinent negatives include no anxiety, blurred vision, chest pain, headaches, malaise/fatigue, neck pain, orthopnea, palpitations, PND or shortness of breath. There are no associated agents to hypertension. There are no known risk factors for coronary artery disease. Past treatments include calcium channel blockers and diuretics. The current treatment provides moderate improvement. There are no compliance problems.  There is no history of angina, kidney disease, CAD/MI, CVA, heart failure, left ventricular hypertrophy, PVD, renovascular disease or retinopathy. There is no history of chronic renal disease or a hypertension causing med.  Gastroesophageal Reflux  She complains of heartburn. She reports no abdominal pain, no belching, no chest pain, no choking, no coughing, no dysphagia, no early satiety, no nausea, no sore throat or no wheezing. This is a recurrent problem. The current episode started more than 1 year ago. The heartburn is located in the substernum. The symptoms are aggravated by certain foods. Associated symptoms include weight loss. Pertinent negatives include no melena. She has tried a PPI for the symptoms. The treatment provided moderate relief. Past procedures do not include an abdominal ultrasound, an EGD, esophageal manometry, esophageal pH monitoring, H. pylori antibody titer or a UGI.  Hyperlipidemia  This is a chronic problem. The current episode started more than 1 year ago. The problem is controlled. Recent lipid tests were reviewed and are normal. Exacerbating diseases include  obesity. She has no history of chronic renal disease or hypothyroidism. Pertinent negatives include no chest pain, focal weakness, myalgias or shortness of breath. Treatments tried: omega 3. The current treatment provides moderate improvement of lipids. There are no compliance problems.     No problem-specific Assessment & Plan notes found for this encounter.   Past Medical History:  Diagnosis Date  . Anxiety   . Depression   . GERD (gastroesophageal reflux disease)   . Hypertension     Past Surgical History:  Procedure Laterality Date  . TOTAL KNEE ARTHROPLASTY Left 02/29/2015  . VAGINAL HYSTERECTOMY      Family History  Problem Relation Age of Onset  . Cancer Father   . Heart disease Father   . Cancer Maternal Aunt   . Breast cancer Maternal Aunt 40  . Cancer Paternal Aunt   . Breast cancer Paternal Aunt 73  . Cancer Paternal Uncle   . Breast cancer Sister 59    Social History   Social History  . Marital status: Divorced    Spouse name: N/A  . Number of children: N/A  . Years of education: N/A   Occupational History  . Not on file.   Social History Main Topics  . Smoking status: Never Smoker  . Smokeless tobacco: Not on file  . Alcohol use No  . Drug use: No  . Sexual activity: No   Other Topics Concern  . Not on file   Social History Narrative  . No narrative on file    No Known Allergies   Review of Systems  Constitutional: Positive for weight loss. Negative for chills, fever and malaise/fatigue.  HENT:  Negative for congestion, ear discharge, ear pain, hearing loss, nosebleeds, sinus pain, sore throat and tinnitus.   Eyes: Negative for blurred vision, double vision, photophobia, pain, discharge and redness.  Respiratory: Negative for cough, hemoptysis, sputum production, choking, shortness of breath, wheezing and stridor.   Cardiovascular: Negative for chest pain, palpitations, orthopnea, claudication, leg swelling and PND.  Gastrointestinal:  Positive for heartburn. Negative for abdominal pain, blood in stool, constipation, diarrhea, dysphagia, melena, nausea and vomiting.  Genitourinary: Negative for dysuria, flank pain, frequency, hematuria and urgency.  Musculoskeletal: Negative for back pain, joint pain, myalgias and neck pain.  Skin: Negative for itching and rash.  Neurological: Negative for dizziness, tingling, tremors, sensory change, speech change, focal weakness, seizures, loss of consciousness and headaches.  Endo/Heme/Allergies: Negative for environmental allergies and polydipsia. Does not bruise/bleed easily.  Psychiatric/Behavioral: Negative for depression, hallucinations, memory loss, substance abuse and suicidal ideas. The patient is not nervous/anxious and does not have insomnia.      Objective  Vitals:   04/18/16 1023  BP: 120/80  Pulse: 76  Weight: 255 lb (115.7 kg)  Height: 5\' 3"  (1.6 m)    Physical Exam  Constitutional: She is well-developed, well-nourished, and in no distress. No distress.  HENT:  Head: Normocephalic and atraumatic.  Right Ear: Tympanic membrane, external ear and ear canal normal.  Left Ear: Tympanic membrane and external ear normal.  Nose: Nose normal.  Mouth/Throat: Uvula is midline, oropharynx is clear and moist and mucous membranes are normal.  Eyes: Conjunctivae, EOM and lids are normal. Pupils are equal, round, and reactive to light. Right eye exhibits no discharge. Left eye exhibits no discharge.  Fundoscopic exam:      The right eye shows no arteriolar narrowing, no AV nicking and no papilledema.       The left eye shows no arteriolar narrowing, no AV nicking and no papilledema.  Neck: Trachea normal and normal range of motion. Neck supple. Normal carotid pulses, no hepatojugular reflux and no JVD present. Carotid bruit is not present. No thyromegaly present.  Cardiovascular: Normal rate, regular rhythm, S1 normal, S2 normal, normal heart sounds and intact distal pulses.  PMI  is not displaced.  Exam reveals no gallop, no S3, no S4 and no friction rub.   No murmur heard. Pulmonary/Chest: Effort normal and breath sounds normal. She has no wheezes. She has no rales. Right breast exhibits no inverted nipple, no mass, no nipple discharge, no skin change and no tenderness. Left breast exhibits no inverted nipple, no mass, no nipple discharge, no skin change and no tenderness. Breasts are symmetrical.  Abdominal: Soft. Bowel sounds are normal. She exhibits no mass. There is hepatosplenomegaly. There is no tenderness. There is no guarding and no CVA tenderness.  Musculoskeletal: Normal range of motion. She exhibits no edema.  Lymphadenopathy:       Head (right side): No submental and no submandibular adenopathy present.       Head (left side): No submental and no submandibular adenopathy present.    She has no cervical adenopathy.    She has no axillary adenopathy.  Neurological: She is alert. She has normal sensation, normal strength, normal reflexes and intact cranial nerves.  Skin: Skin is warm and dry. She is not diaphoretic.  Psychiatric: Mood and affect normal. She does not exhibit a depressed mood.  Nursing note and vitals reviewed.     Assessment & Plan  Problem List Items Addressed This Visit      Cardiovascular and Mediastinum  Essential hypertension   Relevant Orders   Renal Function Panel     Digestive   Esophageal reflux     Musculoskeletal and Integument   DDD (degenerative disc disease), lumbosacral     Other   Annual physical exam - Primary   Hyperlipidemia   Relevant Orders   Lipid Profile    Other Visit Diagnoses    Breast cancer screening       Relevant Orders   MM Digital Screening        Dr. Elizabeth Sauereanna Jones [facility]  04/18/16

## 2016-04-19 LAB — RENAL FUNCTION PANEL
ALBUMIN: 3.9 g/dL (ref 3.6–4.8)
BUN/Creatinine Ratio: 11 — ABNORMAL LOW (ref 12–28)
BUN: 10 mg/dL (ref 8–27)
CALCIUM: 9 mg/dL (ref 8.7–10.3)
CHLORIDE: 98 mmol/L (ref 96–106)
CO2: 28 mmol/L (ref 18–29)
Creatinine, Ser: 0.92 mg/dL (ref 0.57–1.00)
GFR calc Af Amer: 78 mL/min/{1.73_m2} (ref >59)
GFR calc non Af Amer: 68 mL/min/{1.73_m2} (ref >59)
GLUCOSE: 79 mg/dL (ref 65–99)
PHOSPHORUS: 4.3 mg/dL (ref 2.5–4.5)
POTASSIUM: 4.2 mmol/L (ref 3.5–5.2)
Sodium: 139 mmol/L (ref 134–144)

## 2016-04-19 LAB — LIPID PANEL
Chol/HDL Ratio: 4 ratio units (ref 0.0–4.4)
Cholesterol, Total: 206 mg/dL — ABNORMAL HIGH (ref 100–199)
HDL: 51 mg/dL (ref >39)
LDL CALC: 130 mg/dL — AB (ref 0–99)
Triglycerides: 124 mg/dL (ref 0–149)
VLDL Cholesterol Cal: 25 mg/dL (ref 5–40)

## 2016-04-26 ENCOUNTER — Other Ambulatory Visit: Payer: Self-pay

## 2016-05-09 ENCOUNTER — Ambulatory Visit
Admission: RE | Admit: 2016-05-09 | Discharge: 2016-05-09 | Disposition: A | Discharge status: Home / Self Care | Payer: Medicare Other | Source: Ambulatory Visit | Attending: Family Medicine | Admitting: Family Medicine

## 2016-05-09 DIAGNOSIS — Z1239 Encounter for other screening for malignant neoplasm of breast: Secondary | ICD-10-CM

## 2016-05-09 DIAGNOSIS — Z1231 Encounter for screening mammogram for malignant neoplasm of breast: Secondary | ICD-10-CM | POA: Insufficient documentation

## 2016-05-21 ENCOUNTER — Other Ambulatory Visit: Payer: Self-pay | Admitting: Family Medicine

## 2016-05-21 ENCOUNTER — Other Ambulatory Visit: Payer: Self-pay

## 2016-05-21 DIAGNOSIS — K219 Gastro-esophageal reflux disease without esophagitis: Secondary | ICD-10-CM

## 2016-06-07 ENCOUNTER — Other Ambulatory Visit: Payer: Self-pay | Admitting: Family Medicine

## 2016-06-07 DIAGNOSIS — I10 Essential (primary) hypertension: Secondary | ICD-10-CM

## 2016-06-20 ENCOUNTER — Other Ambulatory Visit: Payer: Self-pay | Admitting: Family Medicine

## 2016-06-20 DIAGNOSIS — I10 Essential (primary) hypertension: Secondary | ICD-10-CM

## 2016-08-17 DIAGNOSIS — H401132 Primary open-angle glaucoma, bilateral, moderate stage: Secondary | ICD-10-CM | POA: Diagnosis not present

## 2016-09-01 ENCOUNTER — Other Ambulatory Visit: Payer: Self-pay | Admitting: Family Medicine

## 2016-09-01 DIAGNOSIS — I10 Essential (primary) hypertension: Secondary | ICD-10-CM

## 2016-10-17 ENCOUNTER — Ambulatory Visit (INDEPENDENT_AMBULATORY_CARE_PROVIDER_SITE_OTHER): Payer: Medicare Other | Admitting: Family Medicine

## 2016-10-17 ENCOUNTER — Encounter: Payer: Self-pay | Admitting: Family Medicine

## 2016-10-17 VITALS — BP 120/70 | HR 72 | Ht 63.0 in | Wt 251.0 lb

## 2016-10-17 DIAGNOSIS — I10 Essential (primary) hypertension: Secondary | ICD-10-CM | POA: Diagnosis not present

## 2016-10-17 DIAGNOSIS — H6121 Impacted cerumen, right ear: Secondary | ICD-10-CM | POA: Diagnosis not present

## 2016-10-17 DIAGNOSIS — K219 Gastro-esophageal reflux disease without esophagitis: Secondary | ICD-10-CM

## 2016-10-17 MED ORDER — PANTOPRAZOLE SODIUM 40 MG PO TBEC: DELAYED_RELEASE_TABLET | ORAL | 1 refills | Status: DC

## 2016-10-17 MED ORDER — TELMISARTAN 40 MG PO TABS: ORAL_TABLET | ORAL | 1 refills | Status: DC

## 2016-10-17 MED ORDER — HYDROCHLOROTHIAZIDE 25 MG PO TABS: ORAL_TABLET | ORAL | 1 refills | Status: DC

## 2016-10-17 NOTE — Progress Notes (Signed)
Name: Diane Small   MRN: [id number]    DOB: 20-Feb-1956   Date:10/17/2016       Progress Note  Subjective  Chief Complaint  Chief Complaint  Patient presents with  . Gastroesophageal Reflux  . Hypertension    Gastroesophageal Reflux  She complains of heartburn. She reports no abdominal pain, no belching, no chest pain, no choking, no coughing, no dysphagia, no early satiety, no globus sensation, no hoarse voice, no nausea, no sore throat, no stridor, no tooth decay, no water brash or no wheezing. This is a new problem. The current episode started more than 1 year ago. The problem occurs occasionally. The problem has been gradually improving. The symptoms are aggravated by certain foods. Pertinent negatives include no anemia, fatigue, melena, muscle weakness, orthopnea or weight loss. Risk factors include obesity. She has tried a PPI for the symptoms. The treatment provided moderate relief.  Hypertension  This is a chronic problem. The current episode started in the past 7 days. The problem has been gradually improving since onset. The problem is controlled. Pertinent negatives include no anxiety, blurred vision, chest pain, headaches, malaise/fatigue, neck pain, orthopnea, palpitations, peripheral edema, PND, shortness of breath or sweats. There are no associated agents to hypertension. Risk factors for coronary artery disease include dyslipidemia and obesity. Past treatments include angiotensin blockers and diuretics. The current treatment provides moderate improvement. There are no compliance problems.  There is no history of angina, kidney disease, CAD/MI, CVA, heart failure, left ventricular hypertrophy, PVD or retinopathy. There is no history of chronic renal disease, a hypertension causing med or renovascular disease.    No problem-specific Assessment & Plan notes found for this encounter.   Past Medical History:  Diagnosis Date  . Anxiety   . Depression   . GERD  (gastroesophageal reflux disease)   . Hypertension     Past Surgical History:  Procedure Laterality Date  . TOTAL KNEE ARTHROPLASTY Left 02/29/2015  . VAGINAL HYSTERECTOMY      Family History  Problem Relation Age of Onset  . Cancer Father   . Heart disease Father   . Cancer Maternal Aunt   . Breast cancer Maternal Aunt 40  . Cancer Paternal Aunt   . Breast cancer Paternal Aunt 72  . Cancer Paternal Uncle   . Breast cancer Sister 54    Social History   Social History  . Marital status: Divorced    Spouse name: N/A  . Number of children: N/A  . Years of education: N/A   Occupational History  . Not on file.   Social History Main Topics  . Smoking status: Never Smoker  . Smokeless tobacco: Never Used  . Alcohol use No  . Drug use: No  . Sexual activity: No   Other Topics Concern  . Not on file   Social History Narrative  . No narrative on file    No Known Allergies  Outpatient Medications Prior to Visit  Medication Sig Dispense Refill  . ALPRAZolam (XANAX) 1 MG tablet Take 1 tablet by mouth at bedtime. psych    . escitalopram (LEXAPRO) 5 MG tablet Take 1 tablet by mouth daily. psych    . latanoprost (XALATAN) 0.005 % ophthalmic solution Dr Inez Pilgrim    . traMADol (ULTRAM) 50 MG tablet Take 1 tablet by mouth. Dr Rosita Kea    . hydrochlorothiazide (HYDRODIURIL) 25 MG tablet TAKE 1 TABLET (25 MG TOTAL) BY MOUTH DAILY. 90 tablet 0  . pantoprazole (PROTONIX) 40 MG tablet TAKE  1 TABLET (40 MG TOTAL) BY MOUTH DAILY. 90 tablet 1  . telmisartan (MICARDIS) 40 MG tablet TAKE 1 TABLET (40 MG TOTAL) BY MOUTH DAILY. 90 tablet 0   No facility-administered medications prior to visit.     Review of Systems  Constitutional: Negative for chills, fatigue, fever, malaise/fatigue and weight loss.  HENT: Negative for ear discharge, ear pain, hoarse voice and sore throat.   Eyes: Negative for blurred vision.  Respiratory: Negative for cough, sputum production, choking, shortness of  breath and wheezing.   Cardiovascular: Negative for chest pain, palpitations, orthopnea, leg swelling and PND.  Gastrointestinal: Positive for heartburn. Negative for abdominal pain, blood in stool, constipation, diarrhea, dysphagia, melena and nausea.  Genitourinary: Negative for dysuria, frequency, hematuria and urgency.  Musculoskeletal: Negative for back pain, joint pain, myalgias, muscle weakness and neck pain.  Skin: Negative for rash.  Neurological: Negative for dizziness, tingling, sensory change, focal weakness and headaches.  Endo/Heme/Allergies: Negative for environmental allergies and polydipsia. Does not bruise/bleed easily.  Psychiatric/Behavioral: Negative for depression and suicidal ideas. The patient is not nervous/anxious and does not have insomnia.      Objective  Vitals:   10/17/16 1028  BP: 120/70  Pulse: 72  Weight: 251 lb (113.9 kg)  Height: 5\' 3"  (1.6 m)    Physical Exam  Constitutional: She is well-developed, well-nourished, and in no distress. No distress.  HENT:  Head: Normocephalic and atraumatic.  Right Ear: External ear normal.  Left Ear: External ear normal.  Nose: Nose normal.  Mouth/Throat: Oropharynx is clear and moist.  Eyes: Conjunctivae and EOM are normal. Pupils are equal, round, and reactive to light. Right eye exhibits no discharge. Left eye exhibits no discharge.  Neck: Normal range of motion. Neck supple. No JVD present. No thyromegaly present.  Cardiovascular: Normal rate, regular rhythm, normal heart sounds and intact distal pulses.  Exam reveals no gallop and no friction rub.   No murmur heard. Pulmonary/Chest: Effort normal and breath sounds normal. She has no wheezes. She has no rales.  Abdominal: Soft. Bowel sounds are normal. She exhibits no mass. There is no tenderness. There is no guarding.  Musculoskeletal: Normal range of motion. She exhibits no edema.  Lymphadenopathy:    She has no cervical adenopathy.  Neurological: She is  alert.  Skin: Skin is warm and dry. She is not diaphoretic.  Psychiatric: Mood and affect normal.  Nursing note and vitals reviewed.     Assessment & Plan  Problem List Items Addressed This Visit      Cardiovascular and Mediastinum   Essential hypertension   Relevant Medications   hydrochlorothiazide (HYDRODIURIL) 25 MG tablet   telmisartan (MICARDIS) 40 MG tablet   Other Relevant Orders   Renal Function Panel     Digestive   Esophageal reflux   Relevant Medications   pantoprazole (PROTONIX) 40 MG tablet    Other Visit Diagnoses    Impacted cerumen of right ear    -  Primary      Meds ordered this encounter  Medications  . pantoprazole (PROTONIX) 40 MG tablet    Sig: TAKE 1 TABLET (40 MG TOTAL) BY MOUTH DAILY.    Dispense:  90 tablet    Refill:  1  . hydrochlorothiazide (HYDRODIURIL) 25 MG tablet    Sig: TAKE 1 TABLET (25 MG TOTAL) BY MOUTH DAILY.    Dispense:  90 tablet    Refill:  1  . telmisartan (MICARDIS) 40 MG tablet    Sig: TAKE  1 TABLET (40 MG TOTAL) BY MOUTH DAILY.    Dispense:  90 tablet    Refill:  1      Dr. Elizabeth Sauer [facility]  10/17/16

## 2016-10-18 LAB — RENAL FUNCTION PANEL
ALBUMIN: 4 g/dL (ref 3.6–4.8)
BUN/Creatinine Ratio: 10 — ABNORMAL LOW (ref 12–28)
BUN: 8 mg/dL (ref 8–27)
CHLORIDE: 99 mmol/L (ref 96–106)
CO2: 18 mmol/L — AB (ref 20–29)
Calcium: 9.1 mg/dL (ref 8.7–10.3)
Creatinine, Ser: 0.83 mg/dL (ref 0.57–1.00)
GFR calc non Af Amer: 77 mL/min/{1.73_m2} (ref >59)
GFR, EST AFRICAN AMERICAN: 89 mL/min/{1.73_m2} (ref >59)
GLUCOSE: 85 mg/dL (ref 65–99)
POTASSIUM: 3.9 mmol/L (ref 3.5–5.2)
Phosphorus: 3.5 mg/dL (ref 2.5–4.5)
Sodium: 140 mmol/L (ref 134–144)

## 2017-01-17 DIAGNOSIS — B9689 Other specified bacterial agents as the cause of diseases classified elsewhere: Secondary | ICD-10-CM | POA: Diagnosis not present

## 2017-01-17 DIAGNOSIS — J209 Acute bronchitis, unspecified: Secondary | ICD-10-CM | POA: Diagnosis not present

## 2017-01-17 DIAGNOSIS — J019 Acute sinusitis, unspecified: Secondary | ICD-10-CM | POA: Diagnosis not present

## 2017-03-01 DIAGNOSIS — H401132 Primary open-angle glaucoma, bilateral, moderate stage: Secondary | ICD-10-CM | POA: Diagnosis not present

## 2017-03-02 ENCOUNTER — Other Ambulatory Visit: Payer: Self-pay | Admitting: Family Medicine

## 2017-03-02 DIAGNOSIS — K219 Gastro-esophageal reflux disease without esophagitis: Secondary | ICD-10-CM

## 2017-03-07 DIAGNOSIS — H401132 Primary open-angle glaucoma, bilateral, moderate stage: Secondary | ICD-10-CM | POA: Diagnosis not present

## 2017-04-01 DIAGNOSIS — M25552 Pain in left hip: Secondary | ICD-10-CM | POA: Diagnosis not present

## 2017-04-01 DIAGNOSIS — M25551 Pain in right hip: Secondary | ICD-10-CM | POA: Diagnosis not present

## 2017-04-01 DIAGNOSIS — M7062 Trochanteric bursitis, left hip: Secondary | ICD-10-CM | POA: Diagnosis not present

## 2017-04-01 DIAGNOSIS — M7061 Trochanteric bursitis, right hip: Secondary | ICD-10-CM | POA: Diagnosis not present

## 2017-04-05 DIAGNOSIS — H401132 Primary open-angle glaucoma, bilateral, moderate stage: Secondary | ICD-10-CM | POA: Diagnosis not present

## 2017-04-16 ENCOUNTER — Encounter: Payer: Self-pay | Admitting: Family Medicine

## 2017-04-16 ENCOUNTER — Ambulatory Visit (INDEPENDENT_AMBULATORY_CARE_PROVIDER_SITE_OTHER): Payer: Medicare Other | Admitting: Family Medicine

## 2017-04-16 VITALS — BP 120/80 | HR 60 | Ht 63.0 in | Wt 269.0 lb

## 2017-04-16 DIAGNOSIS — K219 Gastro-esophageal reflux disease without esophagitis: Secondary | ICD-10-CM

## 2017-04-16 DIAGNOSIS — Z0000 Encounter for general adult medical examination without abnormal findings: Secondary | ICD-10-CM | POA: Diagnosis not present

## 2017-04-16 DIAGNOSIS — E7800 Pure hypercholesterolemia, unspecified: Secondary | ICD-10-CM

## 2017-04-16 DIAGNOSIS — I10 Essential (primary) hypertension: Secondary | ICD-10-CM | POA: Diagnosis not present

## 2017-04-16 DIAGNOSIS — Z1231 Encounter for screening mammogram for malignant neoplasm of breast: Secondary | ICD-10-CM

## 2017-04-16 DIAGNOSIS — Z1239 Encounter for other screening for malignant neoplasm of breast: Secondary | ICD-10-CM

## 2017-04-16 MED ORDER — PANTOPRAZOLE SODIUM 40 MG PO TBEC: 40.0000 mg | DELAYED_RELEASE_TABLET | Freq: Every day | ORAL | 1 refills | Status: DC

## 2017-04-16 MED ORDER — HYDROCHLOROTHIAZIDE 25 MG PO TABS: ORAL_TABLET | ORAL | 1 refills | Status: DC

## 2017-04-16 MED ORDER — TELMISARTAN 40 MG PO TABS: ORAL_TABLET | ORAL | 1 refills | Status: DC

## 2017-04-16 NOTE — Progress Notes (Signed)
Name: Diane Small   MRN: [id number]    DOB: 05/30/1955   Date:04/16/2017       Progress Note  Subjective  Chief Complaint  Chief Complaint  Patient presents with  . Annual Exam    no pap  . Gastroesophageal Reflux  . Hypertension    Gastroesophageal Reflux  She reports no abdominal pain, no belching, no chest pain, no choking, no coughing, no dysphagia, no early satiety, no heartburn, no hoarse voice, no nausea, no sore throat, no stridor, no tooth decay, no water brash or no wheezing. This is a chronic problem. The current episode started more than 1 year ago. The problem has been waxing and waning. The symptoms are aggravated by certain foods and stress. Pertinent negatives include no anemia, fatigue, melena, muscle weakness, orthopnea or weight loss. She has tried a PPI for the symptoms. The treatment provided moderate relief. Past procedures do not include an abdominal ultrasound, an EGD, esophageal manometry, esophageal pH monitoring, H. pylori antibody titer or a UGI.  Hypertension  This is a chronic problem. The current episode started more than 1 year ago. The problem is unchanged. The problem is controlled. Pertinent negatives include no anxiety, blurred vision, chest pain, headaches, malaise/fatigue, neck pain, orthopnea, palpitations, peripheral edema, PND, shortness of breath or sweats. There are no associated agents to hypertension. Risk factors for coronary artery disease include dyslipidemia and stress.    No problem-specific Assessment & Plan notes found for this encounter.   Past Medical History:  Diagnosis Date  . Anxiety   . Depression   . GERD (gastroesophageal reflux disease)   . Hypertension     Past Surgical History:  Procedure Laterality Date  . TOTAL KNEE ARTHROPLASTY Left 02/29/2015  . VAGINAL HYSTERECTOMY      Family History  Problem Relation Age of Onset  . Cancer Father   . Heart disease Father   . Cancer Maternal Aunt   . Breast  cancer Maternal Aunt 40  . Cancer Paternal Aunt   . Breast cancer Paternal Aunt [phone]  . Cancer Paternal Uncle   . Breast cancer Sister [id number]    Social History   Socioeconomic History  . Marital status: Divorced    Spouse name: Not on file  . Number of children: Not on file  . Years of education: Not on file  . Highest education level: Not on file  Social Needs  . Financial resource strain: Not on file  . Food insecurity - worry: Not on file  . Food insecurity - inability: Not on file  . Transportation needs - medical: Not on file  . Transportation needs - non-medical: Not on file  Occupational History  . Not on file  Tobacco Use  . Smoking status: Never Smoker  . Smokeless tobacco: Never Used  Substance and Sexual Activity  . Alcohol use: No    Alcohol/week: 0.0 oz  . Drug use: No  . Sexual activity: No  Other Topics Concern  . Not on file  Social History Narrative  . Not on file    Allergies  Allergen Reactions  . Amoxicillin-Pot Clavulanate Diarrhea    Outpatient Medications Prior to Visit  Medication Sig Dispense Refill  . ALPRAZolam (XANAX) 1 MG tablet Take 1 tablet by mouth at bedtime. psych    . escitalopram (LEXAPRO) 5 MG tablet Take 1 tablet by mouth daily. psych    . latanoprost (XALATAN) 0.005 % ophthalmic solution Dr Inez PilgrimBrasington    . meloxicam (MOBIC) 7.5  MG tablet Take by mouth. Dr Rosita Kea    . timolol (TIMOPTIC) 0.5 % ophthalmic solution Dr Inez Pilgrim    . hydrochlorothiazide (HYDRODIURIL) 25 MG tablet TAKE 1 TABLET (25 MG TOTAL) BY MOUTH DAILY. 90 tablet 1  . pantoprazole (PROTONIX) 40 MG tablet TAKE 1 TABLET BY MOUTH EVERY DAY 90 tablet 0  . telmisartan (MICARDIS) 40 MG tablet TAKE 1 TABLET (40 MG TOTAL) BY MOUTH DAILY. 90 tablet 1  . traMADol (ULTRAM) 50 MG tablet Take 1 tablet by mouth. Dr Rosita Kea     No facility-administered medications prior to visit.     Review of Systems  Constitutional: Negative for chills, fatigue, fever, malaise/fatigue and weight  loss.  HENT: Negative for ear discharge, ear pain, hoarse voice and sore throat.   Eyes: Negative for blurred vision.  Respiratory: Negative for cough, sputum production, choking, shortness of breath and wheezing.   Cardiovascular: Negative for chest pain, palpitations, orthopnea, leg swelling and PND.  Gastrointestinal: Negative for abdominal pain, blood in stool, constipation, diarrhea, dysphagia, heartburn, melena and nausea.  Genitourinary: Negative for dysuria, frequency, hematuria and urgency.  Musculoskeletal: Negative for back pain, joint pain, myalgias, muscle weakness and neck pain.  Skin: Negative for rash.  Neurological: Negative for dizziness, tingling, sensory change, focal weakness and headaches.  Endo/Heme/Allergies: Negative for environmental allergies and polydipsia. Does not bruise/bleed easily.  Psychiatric/Behavioral: Negative for depression and suicidal ideas. The patient is not nervous/anxious and does not have insomnia.      Objective  Vitals:   04/16/17 0935  BP: 120/80  Pulse: 60  Weight: 269 lb (122 kg)  Height: 5\' 3"  (1.6 m)    Physical Exam  Constitutional: She is oriented to person, place, and time and well-developed, well-nourished, and in no distress. No distress.  HENT:  Head: Normocephalic and atraumatic.  Right Ear: Tympanic membrane, external ear and ear canal normal.  Left Ear: Tympanic membrane, external ear and ear canal normal.  Nose: Nose normal.  Mouth/Throat: Uvula is midline and oropharynx is clear and moist.  Eyes: Conjunctivae and EOM are normal. Pupils are equal, round, and reactive to light. Right eye exhibits no discharge. Left eye exhibits no discharge.  Fundoscopic exam:      The right eye shows no arteriolar narrowing and no AV nicking.       The left eye shows no arteriolar narrowing and no AV nicking.  Neck: Trachea normal and normal range of motion. Neck supple. Normal carotid pulses, no hepatojugular reflux and no JVD  present. Carotid bruit is not present. No thyromegaly present.  Cardiovascular: Normal rate, regular rhythm, S1 normal, S2 normal, normal heart sounds, intact distal pulses and normal pulses. PMI is not displaced. Exam reveals no gallop, no S3, no S4, no friction rub and no decreased pulses.  No murmur heard. Pulmonary/Chest: Effort normal and breath sounds normal. No accessory muscle usage. No respiratory distress. She has no decreased breath sounds. She has no wheezes. She has no rhonchi. She has no rales. Right breast exhibits no inverted nipple, no mass, no nipple discharge, no skin change and no tenderness. Left breast exhibits no inverted nipple, no mass, no nipple discharge, no skin change and no tenderness. Breasts are symmetrical.  Abdominal: Soft. Bowel sounds are normal. She exhibits no distension and no mass. There is no hepatosplenomegaly, splenomegaly or hepatomegaly. There is no tenderness. There is no rebound, no guarding and no CVA tenderness.  Musculoskeletal: Normal range of motion. She exhibits no edema.  Lymphadenopathy:  Head (right side): No submental and no submandibular adenopathy present.       Head (left side): No submental and no submandibular adenopathy present.    She has no cervical adenopathy.    She has no axillary adenopathy.  Neurological: She is alert and oriented to person, place, and time. She has normal sensation, normal strength, normal reflexes and intact cranial nerves.  Skin: Skin is warm, dry and intact. She is not diaphoretic.  Psychiatric: Mood and affect normal.  Nursing note and vitals reviewed.     Assessment & Plan  Problem List Items Addressed This Visit      Cardiovascular and Mediastinum   BP (high blood pressure)   Relevant Medications   hydrochlorothiazide (HYDRODIURIL) 25 MG tablet   telmisartan (MICARDIS) 40 MG tablet   Other Relevant Orders   Renal Function Panel     Digestive   Acid reflux   Relevant Medications    pantoprazole (PROTONIX) 40 MG tablet     Other   Annual physical exam - Primary   Pure hypercholesterolemia   Relevant Medications   hydrochlorothiazide (HYDRODIURIL) 25 MG tablet   telmisartan (MICARDIS) 40 MG tablet   Other Relevant Orders   Lipid panel    Other Visit Diagnoses    Breast cancer screening       Relevant Orders   MM Digital Screening   MM Digital Screening      Meds ordered this encounter  Medications  . hydrochlorothiazide (HYDRODIURIL) 25 MG tablet    Sig: TAKE 1 TABLET (25 MG TOTAL) BY MOUTH DAILY.    Dispense:  90 tablet    Refill:  1  . pantoprazole (PROTONIX) 40 MG tablet    Sig: Take 1 tablet (40 mg total) by mouth daily.    Dispense:  90 tablet    Refill:  1  . telmisartan (MICARDIS) 40 MG tablet    Sig: TAKE 1 TABLET (40 MG TOTAL) BY MOUTH DAILY.    Dispense:  90 tablet    Refill:  1      Dr[facility]  04/16/17

## 2017-04-17 LAB — RENAL FUNCTION PANEL
Albumin: 4 g/dL (ref 3.6–4.8)
BUN/Creatinine Ratio: 17 (ref 12–28)
BUN: 17 mg/dL (ref 8–27)
CO2: 24 mmol/L (ref 20–29)
CREATININE: 1 mg/dL (ref 0.57–1.00)
Calcium: 9 mg/dL (ref 8.7–10.3)
Chloride: 105 mmol/L (ref 96–106)
GFR calc Af Amer: 70 mL/min/{1.73_m2} (ref >59)
GFR, EST NON AFRICAN AMERICAN: 61 mL/min/{1.73_m2} (ref >59)
Glucose: 89 mg/dL (ref 65–99)
PHOSPHORUS: 4.6 mg/dL — AB (ref 2.5–4.5)
Potassium: 4.2 mmol/L (ref 3.5–5.2)
SODIUM: 142 mmol/L (ref 134–144)

## 2017-04-17 LAB — LIPID PANEL
CHOL/HDL RATIO: 3.2 ratio (ref 0.0–4.4)
Cholesterol, Total: 193 mg/dL (ref 100–199)
HDL: 61 mg/dL (ref >39)
LDL CALC: 101 mg/dL — AB (ref 0–99)
TRIGLYCERIDES: 157 mg/dL — AB (ref 0–149)
VLDL CHOLESTEROL CAL: 31 mg/dL (ref 5–40)

## 2017-05-14 ENCOUNTER — Ambulatory Visit
Admission: RE | Admit: 2017-05-14 | Discharge: 2017-05-14 | Disposition: A | Discharge status: Home / Self Care | Payer: Medicare Other | Source: Ambulatory Visit | Attending: Family Medicine | Admitting: Family Medicine

## 2017-05-14 ENCOUNTER — Other Ambulatory Visit: Payer: Self-pay | Admitting: Family Medicine

## 2017-05-14 ENCOUNTER — Ambulatory Visit: Payer: Medicare Other

## 2017-05-14 DIAGNOSIS — Z1231 Encounter for screening mammogram for malignant neoplasm of breast: Secondary | ICD-10-CM | POA: Insufficient documentation

## 2017-05-14 DIAGNOSIS — Z1239 Encounter for other screening for malignant neoplasm of breast: Secondary | ICD-10-CM

## 2017-08-31 DIAGNOSIS — J0100 Acute maxillary sinusitis, unspecified: Secondary | ICD-10-CM | POA: Diagnosis not present

## 2017-10-15 ENCOUNTER — Encounter: Payer: Self-pay | Admitting: Family Medicine

## 2017-10-15 ENCOUNTER — Ambulatory Visit: Payer: Medicare Other | Admitting: Family Medicine

## 2017-10-15 VITALS — BP 120/60 | HR 60 | Ht 63.0 in | Wt 279.0 lb

## 2017-10-15 DIAGNOSIS — J4520 Mild intermittent asthma, uncomplicated: Secondary | ICD-10-CM | POA: Diagnosis not present

## 2017-10-15 DIAGNOSIS — I10 Essential (primary) hypertension: Secondary | ICD-10-CM

## 2017-10-15 DIAGNOSIS — J301 Allergic rhinitis due to pollen: Secondary | ICD-10-CM

## 2017-10-15 DIAGNOSIS — E785 Hyperlipidemia, unspecified: Secondary | ICD-10-CM

## 2017-10-15 DIAGNOSIS — K219 Gastro-esophageal reflux disease without esophagitis: Secondary | ICD-10-CM

## 2017-10-15 DIAGNOSIS — R635 Abnormal weight gain: Secondary | ICD-10-CM | POA: Diagnosis not present

## 2017-10-15 MED ORDER — ALBUTEROL SULFATE HFA 108 (90 BASE) MCG/ACT IN AERS: 1.0000 | INHALATION_SPRAY | Freq: Four times a day (QID) | RESPIRATORY_TRACT | 11 refills | Status: DC | PRN

## 2017-10-15 MED ORDER — PANTOPRAZOLE SODIUM 40 MG PO TBEC: 40.0000 mg | DELAYED_RELEASE_TABLET | Freq: Every day | ORAL | 1 refills | Status: DC

## 2017-10-15 MED ORDER — HYDROCHLOROTHIAZIDE 25 MG PO TABS: ORAL_TABLET | ORAL | 1 refills | Status: DC

## 2017-10-15 MED ORDER — TELMISARTAN 40 MG PO TABS: ORAL_TABLET | ORAL | 1 refills | Status: DC

## 2017-10-15 NOTE — Assessment & Plan Note (Signed)
Controlled. Continue Timasartin 40 mg daily and hydrochlorothiazide 25 mg daily.  Will check renal panel.

## 2017-10-15 NOTE — Assessment & Plan Note (Signed)
Moderate control with diet alone. Will evaluate with lipid panel and adjust accordingly.

## 2017-10-15 NOTE — Progress Notes (Signed)
Name: Diane Small   MRN: [id number]    DOB: 1955/12/30   Date:10/15/2017       Progress Note  Subjective  Chief Complaint  Chief Complaint  Patient presents with  . Allergic Rhinitis     needs refill on proair and singulair  . Hypertension  . Gastroesophageal Reflux  . Foot Swelling    walk in clinic said she might need to increase fluid pill    Hypertension  This is a chronic problem. The current episode started more than 1 year ago. The problem has been waxing and waning since onset. The problem is controlled. Associated symptoms include shortness of breath. Pertinent negatives include no anxiety, blurred vision, chest pain, headaches, malaise/fatigue, neck pain, orthopnea, palpitations, peripheral edema, PND or sweats. There are no associated agents to hypertension. There are no known risk factors for coronary artery disease. Past treatments include angiotensin blockers and diuretics. The current treatment provides moderate improvement. There are no compliance problems.  There is no history of angina, kidney disease, CAD/MI, CVA, heart failure, left ventricular hypertrophy, PVD or retinopathy. There is no history of chronic renal disease, a hypertension causing med or renovascular disease.  URI   This is a chronic problem. The current episode started more than 1 year ago. The problem has been waxing and waning. There has been no fever. Associated symptoms include congestion, sinus pain and sneezing. Pertinent negatives include no abdominal pain, chest pain, coughing, diarrhea, dysuria, ear pain, headaches, joint pain, joint swelling, nausea, neck pain, plugged ear sensation, rash, sore throat, swollen glands, vomiting or wheezing. She has tried antihistamine (singulair) for the symptoms. The treatment provided moderate relief.  Gastroesophageal Reflux  She reports no abdominal pain, no belching, no chest pain, no choking, no coughing, no dysphagia, no early satiety, no globus  sensation, no heartburn, no hoarse voice, no nausea, no sore throat, no stridor or no wheezing. This is a chronic problem. The current episode started more than 1 year ago. The problem occurs occasionally. The problem has been waxing and waning. The symptoms are aggravated by exertion and certain foods. There are no known risk factors. She has tried a PPI (pantoprazole) for the symptoms. The treatment provided moderate relief.  Hyperlipidemia  This is a chronic problem. The current episode started more than 1 year ago. The problem is controlled. Recent lipid tests were reviewed and are normal. Exacerbating diseases include obesity. She has no history of chronic renal disease or hypothyroidism. Associated symptoms include shortness of breath. Pertinent negatives include no chest pain. The current treatment provides moderate improvement of lipids. There are no compliance problems.   Asthma  She complains of shortness of breath. There is no cough, difficulty breathing, hoarse voice or wheezing. Primary symptoms comments: Patient was seen 4 times for reactive airway dis. This is a recurrent problem. The current episode started more than 1 month ago (entire spring). Associated symptoms include sneezing. Pertinent negatives include no chest pain, ear pain, headaches, heartburn, malaise/fatigue, PND, sore throat or sweats. Her past medical history is significant for asthma.    Essential hypertension Controlled. Continue Timasartin 40 mg daily and hydrochlorothiazide 25 mg daily.  Will check renal panel.  Esophageal reflux Controlled. Will continue pantoprazole 40 mg daily.   Hyperlipidemia Moderate control with diet alone. Will evaluate with lipid panel and adjust accordingly.   Past Medical History:  Diagnosis Date  . Anxiety   . Depression   . GERD (gastroesophageal reflux disease)   . Hypertension  Past Surgical History:  Procedure Laterality Date  . OOPHORECTOMY    . TOTAL KNEE  ARTHROPLASTY Left 02/29/2015  . VAGINAL HYSTERECTOMY      Family History  Problem Relation Age of Onset  . Cancer Father   . Heart disease Father   . Cancer Maternal Aunt   . Breast cancer Maternal Aunt 83  . Cancer Paternal Aunt   . Breast cancer Paternal Aunt 40  . Cancer Paternal Uncle   . Breast cancer Sister [id number]    Social History   Socioeconomic History  . Marital status: Divorced    Spouse name: Not on file  . Number of children: Not on file  . Years of education: Not on file  . Highest education level: Not on file  Occupational History  . Not on file  Social Needs  . Financial resource strain: Not on file  . Food insecurity:    Worry: Not on file    Inability: Not on file  . Transportation needs:    Medical: Not on file    Non-medical: Not on file  Tobacco Use  . Smoking status: Never Smoker  . Smokeless tobacco: Never Used  Substance and Sexual Activity  . Alcohol use: No    Alcohol/week: 0.0 oz  . Drug use: No  . Sexual activity: Never  Lifestyle  . Physical activity:    Days per week: Not on file    Minutes per session: Not on file  . Stress: Not on file  Relationships  . Social connections:    Talks on phone: Not on file    Gets together: Not on file    Attends religious service: Not on file    Active member of club or organization: Not on file    Attends meetings of clubs or organizations: Not on file    Relationship status: Not on file  . Intimate partner violence:    Fear of current or ex partner: Not on file    Emotionally abused: Not on file    Physically abused: Not on file    Forced sexual activity: Not on file  Other Topics Concern  . Not on file  Social History Narrative  . Not on file    Allergies  Allergen Reactions  . Amoxicillin-Pot Clavulanate Diarrhea    Outpatient Medications Prior to Visit  Medication Sig Dispense Refill  . ALPRAZolam (XANAX) 1 MG tablet Take 1 tablet by mouth at bedtime. psych    . escitalopram  (LEXAPRO) 5 MG tablet Take 1 tablet by mouth daily. psych    . latanoprost (XALATAN) 0.005 % ophthalmic solution Dr Inez PilgrimBrasington    . meloxicam (MOBIC) 7.5 MG tablet Take by mouth. Dr Rosita KeaMenz    . montelukast (SINGULAIR) 10 MG tablet Take 1 tablet by mouth daily.    . timolol (TIMOPTIC) 0.5 % ophthalmic solution Dr Inez PilgrimBrasington    . albuterol (PROVENTIL HFA;VENTOLIN HFA) 108 (90 Base) MCG/ACT inhaler Inhale 1-2 puffs into the lungs every 6 (six) hours as needed for wheezing or shortness of breath.    . hydrochlorothiazide (HYDRODIURIL) 25 MG tablet TAKE 1 TABLET (25 MG TOTAL) BY MOUTH DAILY. 90 tablet 1  . pantoprazole (PROTONIX) 40 MG tablet Take 1 tablet (40 mg total) by mouth daily. 90 tablet 1  . telmisartan (MICARDIS) 40 MG tablet TAKE 1 TABLET (40 MG TOTAL) BY MOUTH DAILY. 90 tablet 1  . traMADol (ULTRAM) 50 MG tablet Take 1 tablet by mouth. Dr Rosita KeaMenz  No facility-administered medications prior to visit.     Review of Systems  Constitutional: Negative for malaise/fatigue.       Weight gain/been on Pred /25lbs  HENT: Positive for congestion, sinus pain and sneezing. Negative for ear pain, hoarse voice and sore throat.   Eyes: Negative for blurred vision.  Respiratory: Positive for shortness of breath. Negative for cough, choking and wheezing.   Cardiovascular: Negative for chest pain, palpitations, orthopnea and PND.  Gastrointestinal: Negative for abdominal pain, diarrhea, dysphagia, heartburn, nausea and vomiting.  Genitourinary: Negative for dysuria.  Musculoskeletal: Negative for joint pain and neck pain.  Skin: Negative for rash.  Neurological: Negative for headaches.     Objective  Vitals:   10/15/17 0947  BP: 120/60  Pulse: 60  Weight: 279 lb (126.6 kg)  Height: 5\' 3"  (1.6 m)    Physical Exam  Constitutional: She appears well-developed. No distress.  HENT:  Head: Normocephalic and atraumatic.  Right Ear: External ear normal.  Left Ear: External ear normal.  Nose:  Nose normal.  Mouth/Throat: Oropharynx is clear and moist.  Eyes: Pupils are equal, round, and reactive to light. Conjunctivae and EOM are normal. Right eye exhibits no discharge. Left eye exhibits no discharge.  Neck: Normal range of motion. Neck supple. No JVD present. No thyromegaly present.  Cardiovascular: Normal rate, regular rhythm, normal heart sounds and intact distal pulses. Exam reveals no gallop and no friction rub.  No murmur heard. Pulmonary/Chest: Effort normal and breath sounds normal.  Abdominal: Soft. Bowel sounds are normal. She exhibits no mass. There is no tenderness. There is no guarding.  Musculoskeletal: Normal range of motion. She exhibits no edema.  Lymphadenopathy:    She has no cervical adenopathy.  Neurological: She is alert. She has normal reflexes.  Skin: Skin is warm and dry. She is not diaphoretic.  Nursing note and vitals reviewed.     Assessment & Plan  Problem List Items Addressed This Visit      Cardiovascular and Mediastinum   Essential hypertension - Primary    Controlled. Continue Timasartin 40 mg daily and hydrochlorothiazide 25 mg daily.  Will check renal panel.      Relevant Medications   telmisartan (MICARDIS) 40 MG tablet   hydrochlorothiazide (HYDRODIURIL) 25 MG tablet   Other Relevant Orders   Renal Function Panel     Digestive   Esophageal reflux    Controlled. Will continue pantoprazole 40 mg daily.       Relevant Medications   pantoprazole (PROTONIX) 40 MG tablet     Other   Hyperlipidemia    Moderate control with diet alone. Will evaluate with lipid panel and adjust accordingly.      Relevant Medications   telmisartan (MICARDIS) 40 MG tablet   hydrochlorothiazide (HYDRODIURIL) 25 MG tablet   Other Relevant Orders   Lipid panel    Other Visit Diagnoses    Weight gain       Likely secondary to multiple treatment with Prednisone. Diet with decreased calories suggested.   Relevant Orders   TSH   Mild intermittent  asthma, unspecified whether complicated       Multiple urgent care visits for acute asthma. Will consult pulmonary for possible pulmonary function and possible long term treament.   Relevant Medications   montelukast (SINGULAIR) 10 MG tablet   albuterol (PROVENTIL HFA;VENTOLIN HFA) 108 (90 Base) MCG/ACT inhaler   Other Relevant Orders   Ambulatory referral to Pulmonology   Seasonal allergic rhinitis due to pollen  Episodic seasonal allergies especially difficult this past spring. Will consult Ent for allergy testing and possible desensitization therapy.   Relevant Orders   Ambulatory referral to ENT      Meds ordered this encounter  Medications  . telmisartan (MICARDIS) 40 MG tablet    Sig: TAKE 1 TABLET (40 MG TOTAL) BY MOUTH DAILY.    Dispense:  90 tablet    Refill:  1  . hydrochlorothiazide (HYDRODIURIL) 25 MG tablet    Sig: TAKE 1 TABLET (25 MG TOTAL) BY MOUTH DAILY.    Dispense:  90 tablet    Refill:  1  . pantoprazole (PROTONIX) 40 MG tablet    Sig: Take 1 tablet (40 mg total) by mouth daily.    Dispense:  90 tablet    Refill:  1  . albuterol (PROVENTIL HFA;VENTOLIN HFA) 108 (90 Base) MCG/ACT inhaler    Sig: Inhale 1-2 puffs into the lungs every 6 (six) hours as needed for wheezing or shortness of breath.    Dispense:  1 Inhaler    Refill:  11      Dr. [facility]  10/15/17

## 2017-10-15 NOTE — Assessment & Plan Note (Signed)
Controlled. Will continue pantoprazole 40 mg daily. 

## 2017-10-16 LAB — RENAL FUNCTION PANEL
Albumin: 4.1 g/dL (ref 3.6–4.8)
BUN/Creatinine Ratio: 14 (ref 12–28)
BUN: 12 mg/dL (ref 8–27)
CO2: 27 mmol/L (ref 20–29)
CREATININE: 0.83 mg/dL (ref 0.57–1.00)
Calcium: 9 mg/dL (ref 8.7–10.3)
Chloride: 98 mmol/L (ref 96–106)
GFR calc Af Amer: 88 mL/min/{1.73_m2} (ref >59)
GFR, EST NON AFRICAN AMERICAN: 76 mL/min/{1.73_m2} (ref >59)
GLUCOSE: 96 mg/dL (ref 65–99)
POTASSIUM: 3.9 mmol/L (ref 3.5–5.2)
Phosphorus: 4.1 mg/dL (ref 2.5–4.5)
SODIUM: 140 mmol/L (ref 134–144)

## 2017-10-16 LAB — LIPID PANEL
CHOL/HDL RATIO: 3.2 ratio (ref 0.0–4.4)
Cholesterol, Total: 184 mg/dL (ref 100–199)
HDL: 58 mg/dL (ref >39)
LDL Calculated: 105 mg/dL — ABNORMAL HIGH (ref 0–99)
TRIGLYCERIDES: 105 mg/dL (ref 0–149)
VLDL Cholesterol Cal: 21 mg/dL (ref 5–40)

## 2017-10-16 LAB — TSH: TSH: 1.21 u[IU]/mL (ref 0.450–4.500)

## 2017-10-29 DIAGNOSIS — J310 Chronic rhinitis: Secondary | ICD-10-CM | POA: Diagnosis not present

## 2017-10-29 DIAGNOSIS — K219 Gastro-esophageal reflux disease without esophagitis: Secondary | ICD-10-CM | POA: Diagnosis not present

## 2017-10-29 DIAGNOSIS — J449 Chronic obstructive pulmonary disease, unspecified: Secondary | ICD-10-CM | POA: Diagnosis not present

## 2017-10-29 DIAGNOSIS — R0609 Other forms of dyspnea: Secondary | ICD-10-CM | POA: Diagnosis not present

## 2017-10-29 DIAGNOSIS — R05 Cough: Secondary | ICD-10-CM | POA: Diagnosis not present

## 2017-11-13 DIAGNOSIS — J301 Allergic rhinitis due to pollen: Secondary | ICD-10-CM | POA: Diagnosis not present

## 2017-11-13 DIAGNOSIS — J0100 Acute maxillary sinusitis, unspecified: Secondary | ICD-10-CM | POA: Diagnosis not present

## 2017-11-13 DIAGNOSIS — K219 Gastro-esophageal reflux disease without esophagitis: Secondary | ICD-10-CM | POA: Diagnosis not present

## 2017-11-13 DIAGNOSIS — G4733 Obstructive sleep apnea (adult) (pediatric): Secondary | ICD-10-CM | POA: Diagnosis not present

## 2017-11-29 DIAGNOSIS — J301 Allergic rhinitis due to pollen: Secondary | ICD-10-CM | POA: Diagnosis not present

## 2017-11-29 DIAGNOSIS — J0100 Acute maxillary sinusitis, unspecified: Secondary | ICD-10-CM | POA: Diagnosis not present

## 2017-11-29 DIAGNOSIS — H6060 Unspecified chronic otitis externa, unspecified ear: Secondary | ICD-10-CM | POA: Diagnosis not present

## 2018-01-30 DIAGNOSIS — J301 Allergic rhinitis due to pollen: Secondary | ICD-10-CM | POA: Diagnosis not present

## 2018-01-30 DIAGNOSIS — J320 Chronic maxillary sinusitis: Secondary | ICD-10-CM | POA: Diagnosis not present

## 2018-02-07 IMAGING — MG MM DIGITAL SCREENING BILAT W/ TOMO W/ CAD
8 of 12 series · 8 of 28 positions shown · non-contrast
Comparison: Previous exam(s).

CLINICAL DATA: Screening.

EXAM:
2D DIGITAL SCREENING BILATERAL MAMMOGRAM WITH 3D TOMO WITH CAD

[L CC]
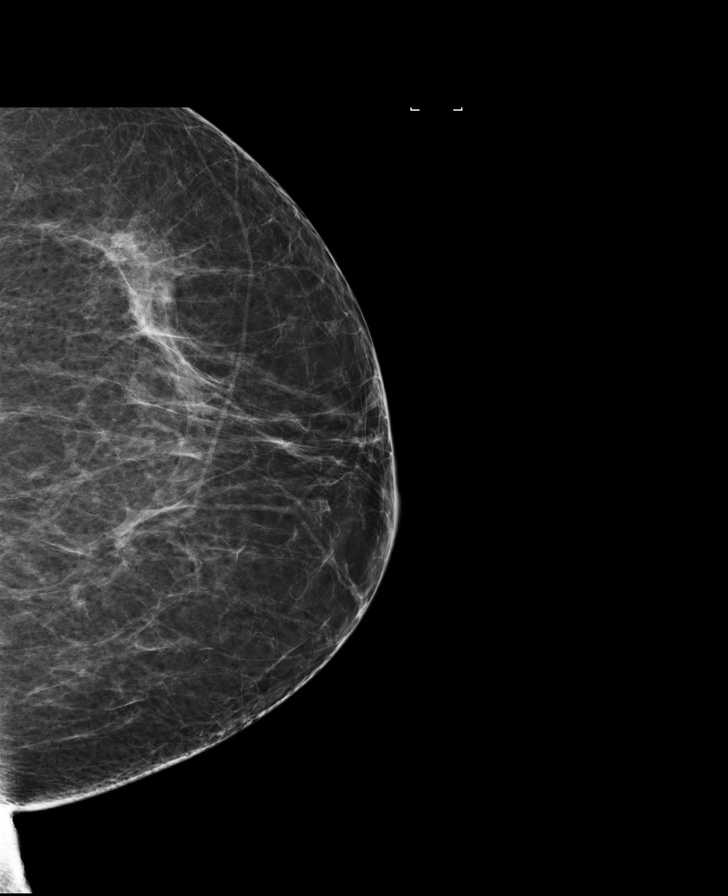

[R CC synth-2D]
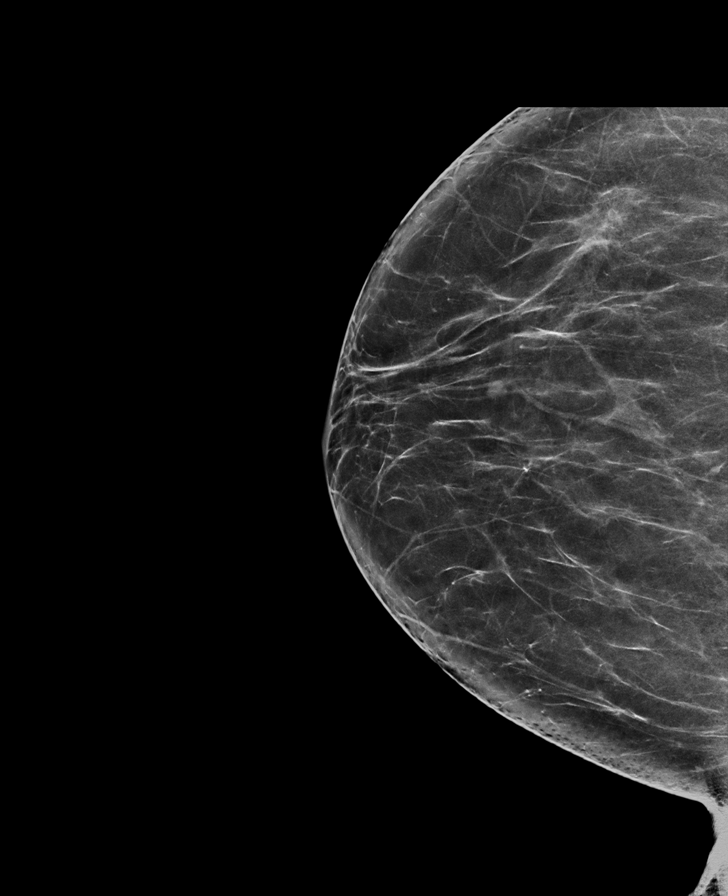

[R MLO]
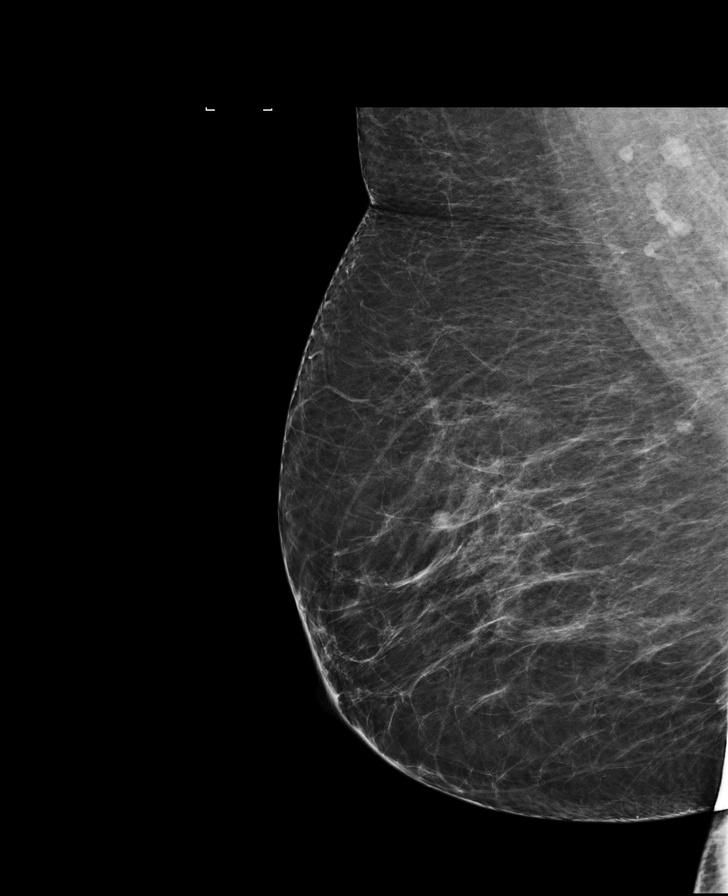

[L CC synth-2D]
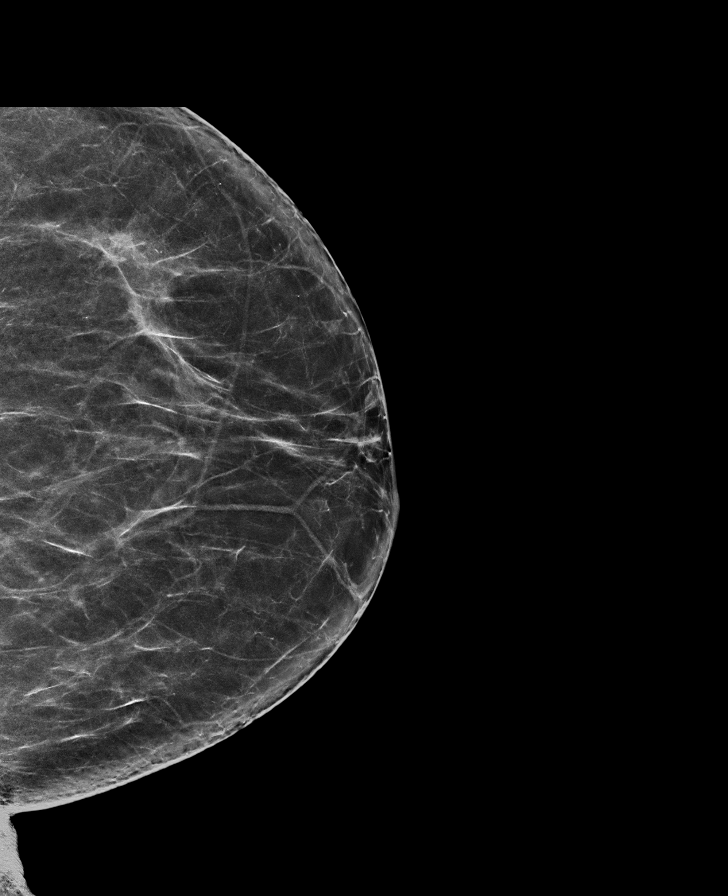

[R CC]
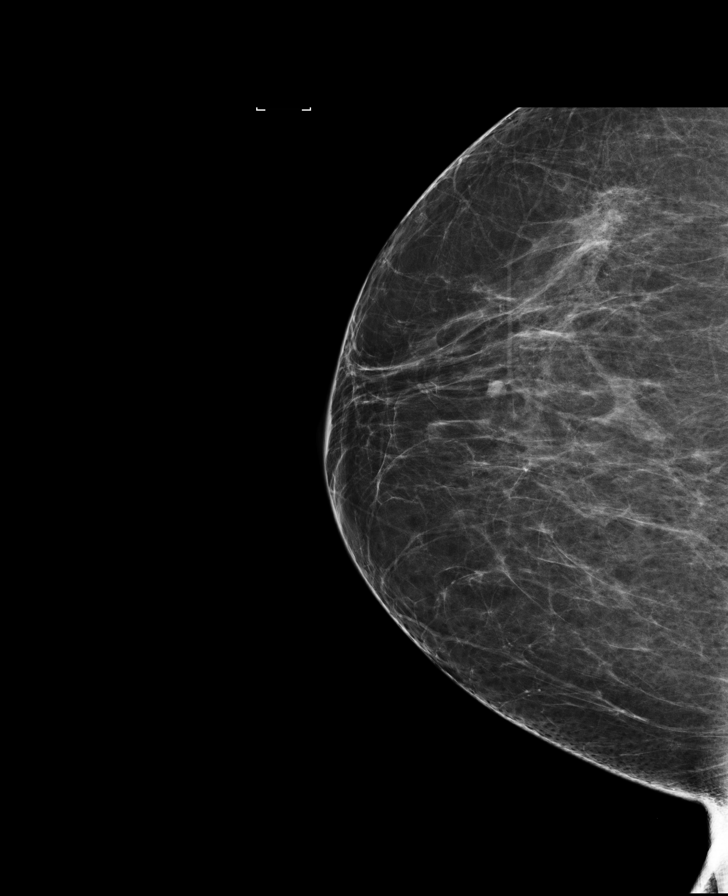

[L MLO synth-2D]
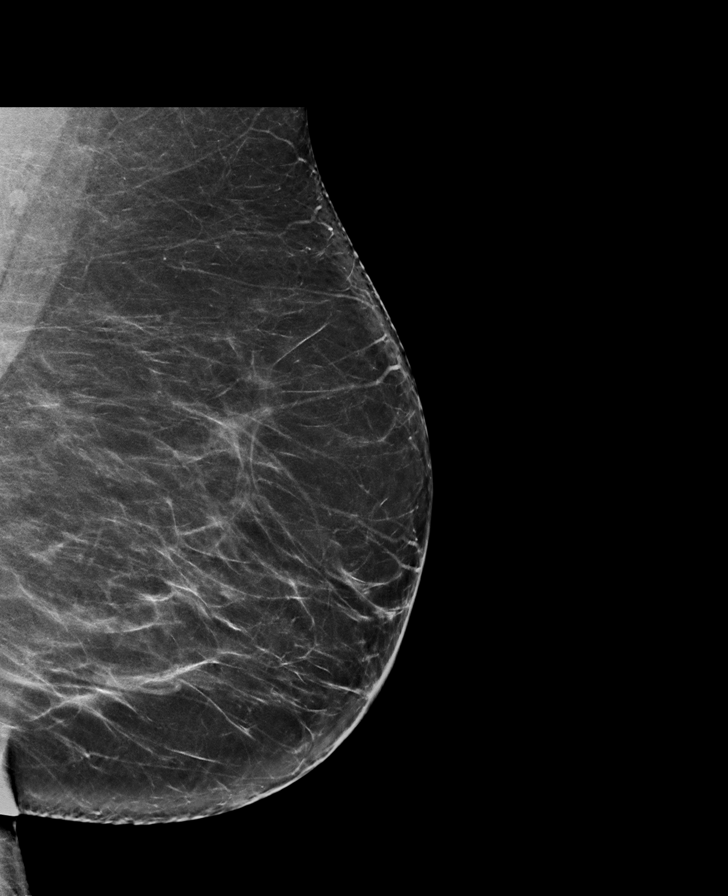

[R MLO synth-2D]
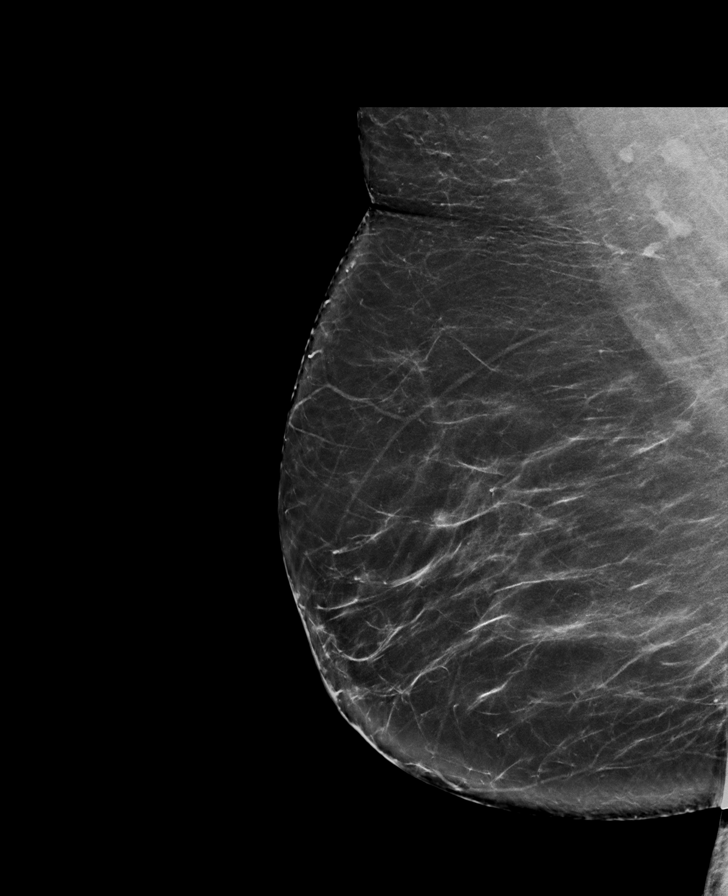

[L MLO]
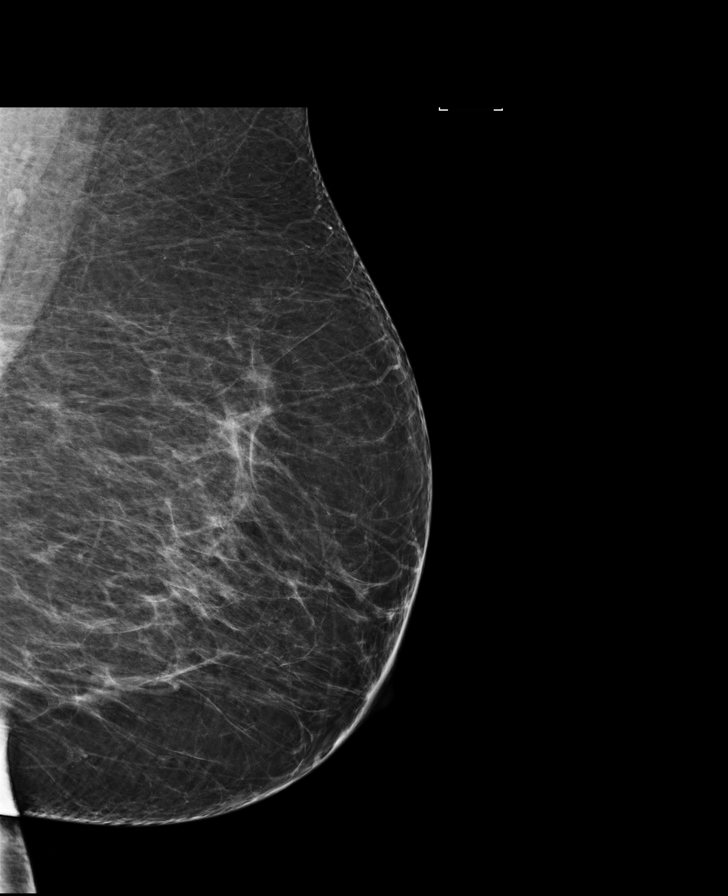

[8 of 28 positions shown; findings below may reference images not displayed]

ACR Breast Density Category b: There are scattered areas of
fibroglandular density.
FINDINGS: There are no findings suspicious for malignancy. Images were
processed with CAD.
IMPRESSION: No mammographic evidence of malignancy. A result letter of this
screening mammogram will be mailed directly to the patient.

RECOMMENDATION:
Screening mammogram in one year. (Code:GE-P-ZS0)

BI-RADS CATEGORY  1: Negative.

## 2018-04-16 DIAGNOSIS — M1711 Unilateral primary osteoarthritis, right knee: Secondary | ICD-10-CM | POA: Diagnosis not present

## 2018-04-17 ENCOUNTER — Encounter: Payer: Self-pay | Admitting: Family Medicine

## 2018-04-17 ENCOUNTER — Other Ambulatory Visit (HOSPITAL_COMMUNITY)
Admission: RE | Admit: 2018-04-17 | Discharge: 2018-04-17 | Disposition: A | Discharge status: Home / Self Care | Payer: Medicare Other | Source: Ambulatory Visit | Attending: Family Medicine | Admitting: Family Medicine

## 2018-04-17 ENCOUNTER — Ambulatory Visit (INDEPENDENT_AMBULATORY_CARE_PROVIDER_SITE_OTHER): Payer: Medicare Other | Admitting: Family Medicine

## 2018-04-17 ENCOUNTER — Other Ambulatory Visit: Payer: Self-pay | Admitting: Family Medicine

## 2018-04-17 VITALS — BP 123/78 | HR 79 | Ht 63.0 in | Wt 272.0 lb

## 2018-04-17 DIAGNOSIS — Z23 Encounter for immunization: Secondary | ICD-10-CM | POA: Diagnosis not present

## 2018-04-17 DIAGNOSIS — Z1272 Encounter for screening for malignant neoplasm of vagina: Secondary | ICD-10-CM

## 2018-04-17 DIAGNOSIS — Z0000 Encounter for general adult medical examination without abnormal findings: Secondary | ICD-10-CM | POA: Diagnosis not present

## 2018-04-17 DIAGNOSIS — Z1231 Encounter for screening mammogram for malignant neoplasm of breast: Secondary | ICD-10-CM

## 2018-04-17 DIAGNOSIS — Z01419 Encounter for gynecological examination (general) (routine) without abnormal findings: Secondary | ICD-10-CM

## 2018-04-17 NOTE — Progress Notes (Signed)
Date:  04/17/2018   Name:  Diane Small   DOB:  08/07/1955   MRN:  [id number]   Chief Complaint: Annual Exam (pap)  Patient is a 62 year old female who presents for a comprehensive physical exam. The patient reports the following problems: umbilical hernia. Health maintenance has been reviewed tdap.   Review of Systems  Constitutional: Negative.  Negative for chills, fatigue, fever and unexpected weight change.  HENT: Negative for congestion, ear discharge, ear pain, rhinorrhea, sinus pressure, sneezing and sore throat.   Eyes: Negative for photophobia, pain, discharge, redness and itching.  Respiratory: Negative for cough, shortness of breath, wheezing and stridor.   Gastrointestinal: Negative for abdominal pain, blood in stool, constipation, diarrhea, nausea and vomiting.  Endocrine: Negative for cold intolerance, heat intolerance, polydipsia, polyphagia and polyuria.  Genitourinary: Negative for dysuria, flank pain, frequency, hematuria, menstrual problem, pelvic pain, urgency, vaginal bleeding and vaginal discharge.  Musculoskeletal: Negative for arthralgias, back pain and myalgias.  Skin: Negative for rash.  Allergic/Immunologic: Negative for environmental allergies and food allergies.  Neurological: Negative for dizziness, weakness, light-headedness, numbness and headaches.  Hematological: Negative for adenopathy. Does not bruise/bleed easily.  Psychiatric/Behavioral: Negative for dysphoric mood. The patient is not nervous/anxious.     Patient Active Problem List   Diagnosis Date Noted  . Pure hypercholesterolemia 04/16/2017  . Hyperlipidemia 04/18/2016  . DDD (degenerative disc disease), lumbosacral 10/17/2015  . Esophageal reflux 04/18/2015  . Essential hypertension 04/18/2015  . Annual physical exam 04/18/2015  . Acid reflux 09/01/2014  . Routine general medical examination at a health care facility 09/01/2014  . Glaucoma 09/01/2014  . BP (high blood  pressure) 09/01/2014  . Extreme obesity 09/01/2014  . Arthritis of knee, degenerative 09/09/2013    Allergies  Allergen Reactions  . Amoxicillin-Pot Clavulanate Diarrhea    Past Surgical History:  Procedure Laterality Date  . OOPHORECTOMY    . TOTAL KNEE ARTHROPLASTY Left 02/29/2015  . VAGINAL HYSTERECTOMY      Social History   Tobacco Use  . Smoking status: Never Smoker  . Smokeless tobacco: Never Used  Substance Use Topics  . Alcohol use: No    Alcohol/week: 0.0 standard drinks  . Drug use: No     Medication list has been reviewed and updated.  Current Meds  Medication Sig  . albuterol (PROVENTIL HFA;VENTOLIN HFA) 108 (90 Base) MCG/ACT inhaler Inhale 1-2 puffs into the lungs every 6 (six) hours as needed for wheezing or shortness of breath.  . ALPRAZolam (XANAX) 1 MG tablet Take 1 tablet by mouth at bedtime. psych  . azelastine (ASTELIN) 0.1 % nasal spray Dr Andee PolesVaught  . budesonide (PULMICORT) 0.5 MG/2ML nebulizer solution Dr Andee PolesVaught  . budesonide-formoterol (SYMBICORT) 160-4.5 MCG/ACT inhaler Inhale into the lungs. Dr Andee PolesVaught  . dexlansoprazole (DEXILANT) 60 MG capsule Take by mouth. Dr Andee PolesVaught  . escitalopram (LEXAPRO) 5 MG tablet Take 1 tablet by mouth daily. psych  . hydrochlorothiazide (HYDRODIURIL) 25 MG tablet TAKE 1 TABLET (25 MG TOTAL) BY MOUTH DAILY.  Marland Kitchen. latanoprost (XALATAN) 0.005 % ophthalmic solution Dr Inez PilgrimBrasington  . meloxicam (MOBIC) 7.5 MG tablet Take by mouth. Dr Rosita KeaMenz  . montelukast (SINGULAIR) 10 MG tablet Take 1 tablet by mouth daily.  Marland Kitchen. telmisartan (MICARDIS) 40 MG tablet TAKE 1 TABLET (40 MG TOTAL) BY MOUTH DAILY.  Marland Kitchen. timolol (TIMOPTIC) 0.5 % ophthalmic solution Dr Inez PilgrimBrasington    [facility] 2HQ 2/9 Scores 04/17/2018 10/15/2017 10/17/2015 10/15/2014  PHQ - 2 Score 0 0 0 1  PHQ-  9 Score 0 2 - -    Physical Exam Vitals signs and nursing note reviewed.  Constitutional:      General: She is not in acute distress.    Appearance: She is not diaphoretic.  HENT:      Head: Normocephalic and atraumatic.     Right Ear: External ear normal.     Left Ear: External ear normal.     Nose: Nose normal.  Eyes:     General:        Right eye: No discharge.        Left eye: No discharge.     Conjunctiva/sclera: Conjunctivae normal.     Pupils: Pupils are equal, round, and reactive to light.  Neck:     Musculoskeletal: Normal range of motion and neck supple.     Thyroid: No thyromegaly.     Vascular: No carotid bruit or JVD.  Cardiovascular:     Rate and Rhythm: Normal rate and regular rhythm.     Heart sounds: Normal heart sounds. No murmur. No friction rub. No gallop.   Pulmonary:     Effort: Pulmonary effort is normal.     Breath sounds: Normal breath sounds.  Abdominal:     General: Bowel sounds are normal.     Palpations: Abdomen is soft. There is no mass.     Tenderness: There is no abdominal tenderness. There is no guarding.  Genitourinary:    Rectum: Guaiac result negative.  Musculoskeletal: Normal range of motion.  Lymphadenopathy:     Cervical: No cervical adenopathy.  Skin:    General: Skin is warm and dry.  Neurological:     Mental Status: She is alert.     Deep Tendon Reflexes: Reflexes are normal and symmetric.     BP 123/78   Pulse 79   Ht 5\' 3"  (1.6 m)   Wt 272 lb (123.4 kg)   BMI 48.18 kg/m   Assessment and Plan: 1. Breast cancer screening by mammogram Discussed with patient and will arrange for mammogram for: Cancer screening.  2. Annual physical exam Subjective/objective notes no issues on history or physical exam.  3. Periodic health assessment, Pap and pelvic Patient underwent her last Pap smear and bimanual exam.  4. Need for diphtheria-tetanus-pertussis (Tdap) vaccine Discussed and administered - Tdap vaccine greater than or equal to 7yo IM  5. Screening for vaginal cancer Pap of vaginal cuff to rule out any residual cervical cancer. - Cytology - PAP

## 2018-04-21 LAB — CYTOLOGY - PAP
DIAGNOSIS: NEGATIVE
HPV (WINDOPATH): NOT DETECTED

## 2018-05-15 ENCOUNTER — Ambulatory Visit
Admission: RE | Admit: 2018-05-15 | Discharge: 2018-05-15 | Disposition: A | Discharge status: Home / Self Care | Payer: Medicare Other | Source: Ambulatory Visit | Attending: Family Medicine | Admitting: Family Medicine

## 2018-05-15 DIAGNOSIS — Z1231 Encounter for screening mammogram for malignant neoplasm of breast: Secondary | ICD-10-CM | POA: Insufficient documentation

## 2018-05-15 DIAGNOSIS — H401132 Primary open-angle glaucoma, bilateral, moderate stage: Secondary | ICD-10-CM | POA: Diagnosis not present

## 2018-05-22 DIAGNOSIS — H401131 Primary open-angle glaucoma, bilateral, mild stage: Secondary | ICD-10-CM | POA: Diagnosis not present

## 2018-05-23 ENCOUNTER — Other Ambulatory Visit: Payer: Self-pay | Admitting: Family Medicine

## 2018-05-23 DIAGNOSIS — I10 Essential (primary) hypertension: Secondary | ICD-10-CM

## 2018-06-04 DIAGNOSIS — J301 Allergic rhinitis due to pollen: Secondary | ICD-10-CM | POA: Diagnosis not present

## 2018-06-04 DIAGNOSIS — J320 Chronic maxillary sinusitis: Secondary | ICD-10-CM | POA: Diagnosis not present

## 2018-06-04 DIAGNOSIS — R05 Cough: Secondary | ICD-10-CM | POA: Diagnosis not present

## 2018-06-24 ENCOUNTER — Other Ambulatory Visit: Payer: Self-pay | Admitting: Family Medicine

## 2018-06-24 DIAGNOSIS — I10 Essential (primary) hypertension: Secondary | ICD-10-CM

## 2018-10-17 ENCOUNTER — Ambulatory Visit (INDEPENDENT_AMBULATORY_CARE_PROVIDER_SITE_OTHER): Payer: Medicare Other | Admitting: Family Medicine

## 2018-10-17 ENCOUNTER — Other Ambulatory Visit: Payer: Self-pay

## 2018-10-17 ENCOUNTER — Encounter: Payer: Self-pay | Admitting: Family Medicine

## 2018-10-17 VITALS — BP 120/90 | HR 72 | Ht 63.0 in | Wt 283.0 lb

## 2018-10-17 DIAGNOSIS — E785 Hyperlipidemia, unspecified: Secondary | ICD-10-CM | POA: Diagnosis not present

## 2018-10-17 DIAGNOSIS — I10 Essential (primary) hypertension: Secondary | ICD-10-CM

## 2018-10-17 MED ORDER — TELMISARTAN 40 MG PO TABS: ORAL_TABLET | ORAL | 1 refills | Status: DC

## 2018-10-17 MED ORDER — HYDROCHLOROTHIAZIDE 25 MG PO TABS: ORAL_TABLET | ORAL | 1 refills | Status: DC

## 2018-10-17 NOTE — Progress Notes (Signed)
Date:  10/17/2018   Name:  Diane Small   DOB:  09/20/1955   MRN:  [id number]   Chief Complaint: Hypertension  Hypertension This is a chronic problem. The current episode started more than 1 year ago. The problem has been gradually improving since onset. The problem is controlled. Pertinent negatives include no anxiety, blurred vision, chest pain, headaches, malaise/fatigue, neck pain, orthopnea, palpitations, peripheral edema, PND, shortness of breath or sweats. There are no associated agents to hypertension. There are no known risk factors for coronary artery disease. Past treatments include angiotensin blockers and diuretics. The current treatment provides moderate improvement. There are no compliance problems.  There is no history of angina, kidney disease, CAD/MI, CVA, heart failure, left ventricular hypertrophy, PVD or retinopathy. There is no history of chronic renal disease, a hypertension causing med or renovascular disease.    Review of Systems  Constitutional: Negative for chills, fever and malaise/fatigue.  HENT: Negative for drooling, ear discharge, ear pain and sore throat.   Eyes: Negative for blurred vision.  Respiratory: Negative for cough, shortness of breath and wheezing.   Cardiovascular: Negative for chest pain, palpitations, orthopnea, leg swelling and PND.  Gastrointestinal: Negative for abdominal pain, blood in stool, constipation, diarrhea and nausea.  Endocrine: Negative for polydipsia.  Genitourinary: Negative for dysuria, frequency, hematuria and urgency.  Musculoskeletal: Negative for back pain, myalgias and neck pain.  Skin: Negative for rash.  Allergic/Immunologic: Negative for environmental allergies.  Neurological: Negative for dizziness and headaches.  Hematological: Does not bruise/bleed easily.  Psychiatric/Behavioral: Negative for suicidal ideas. The patient is not nervous/anxious.     Patient Active Problem List   Diagnosis Date Noted     Pure hypercholesterolemia 04/16/2017   Hyperlipidemia 04/18/2016   DDD (degenerative disc disease), lumbosacral 10/17/2015   Esophageal reflux 04/18/2015   Essential hypertension 04/18/2015   Annual physical exam 04/18/2015   Acid reflux 09/01/2014   Routine general medical examination at a health care facility 09/01/2014   Glaucoma 09/01/2014   BP (high blood pressure) 09/01/2014   Extreme obesity 09/01/2014   Arthritis of knee, degenerative 09/09/2013    Allergies  Allergen Reactions   Amoxicillin-Pot Clavulanate Diarrhea    Past Surgical History:  Procedure Laterality Date   OOPHORECTOMY     TOTAL KNEE ARTHROPLASTY Left 02/29/2015   VAGINAL HYSTERECTOMY      Social History   Tobacco Use   Smoking status: Never Smoker   Smokeless tobacco: Never Used  Substance Use Topics   Alcohol use: No    Alcohol/week: 0.0 standard drinks   Drug use: No     Medication list has been reviewed and updated.  Current Meds  Medication Sig   albuterol (PROVENTIL HFA;VENTOLIN HFA) 108 (90 Base) MCG/ACT inhaler Inhale 1-2 puffs into the lungs every 6 (six) hours as needed for wheezing or shortness of breath.   ALPRAZolam (XANAX) 1 MG tablet Take 1 tablet by mouth at bedtime. psych   azelastine (ASTELIN) 0.1 % nasal spray Dr Andee PolesVaught   budesonide (PULMICORT) 0.5 MG/2ML nebulizer solution Dr Andee PolesVaught   budesonide-formoterol [facility](SYMBICORT) 160-4.5 MCG/ACT inhaler Inhale into the lungs. Dr Andee PolesVaught   dexlansoprazole (DEXILANT) 60 MG capsule Take by mouth. Dr Andee PolesVaught   escitalopram (LEXAPRO) 5 MG tablet Take 1 tablet by mouth daily. psych   hydrochlorothiazide (HYDRODIURIL) 25 MG tablet TAKE 1 TABLET BY MOUTH EVERY DAY   latanoprost (XALATAN) 0.005 % ophthalmic solution Dr Inez PilgrimBrasington   meloxicam (MOBIC) 7.5 MG tablet Take by mouth. Dr  Menz   montelukast (SINGULAIR) 10 MG tablet Take 1 tablet by mouth daily.   telmisartan (MICARDIS) 40 MG tablet TAKE 1 TABLET BY  MOUTH EVERY DAY   timolol (TIMOPTIC) 0.5 % ophthalmic solution Dr Wallace Going    [facility] 2/9 Scores 10/17/2018 04/17/2018 10/15/2017 10/17/2015  PHQ - 2 Score 0 0 0 0  PHQ- 9 Score 0 0 2 -    BP Readings from Last 3 Encounters:  10/17/18 120/90  04/17/18 123/78  10/15/17 120/60    Physical Exam Vitals signs and nursing note reviewed.  Constitutional:      Appearance: She is well-developed.  HENT:     Head: Normocephalic.     Right Ear: Tympanic membrane, ear canal and external ear normal.     Left Ear: Tympanic membrane, ear canal and external ear normal.     Nose: Nose normal. No congestion or rhinorrhea.  Eyes:     General: Lids are everted, no foreign bodies appreciated. No scleral icterus.       Left eye: No foreign body or hordeolum.     Conjunctiva/sclera: Conjunctivae normal.     Right eye: Right conjunctiva is not injected.     Left eye: Left conjunctiva is not injected.     Pupils: Pupils are equal, round, and reactive to light.  Neck:     Musculoskeletal: Normal range of motion and neck supple.     Thyroid: No thyromegaly.     Vascular: No JVD.     Trachea: No tracheal deviation.  Cardiovascular:     Rate and Rhythm: Normal rate and regular rhythm.  No extrasystoles are present.    Chest Wall: PMI is not displaced. No thrill.     Pulses: Normal pulses.          Carotid pulses are 2+ on the right side and 2+ on the left side.      Radial pulses are 2+ on the right side and 2+ on the left side.       Femoral pulses are 2+ on the right side and 2+ on the left side.      Popliteal pulses are 2+ on the right side and 2+ on the left side.       Dorsalis pedis pulses are 2+ on the right side and 2+ on the left side.       Posterior tibial pulses are 2+ on the right side and 2+ on the left side.     Heart sounds: Normal heart sounds, S1 normal and S2 normal. Heart sounds not distant. No murmur. No systolic murmur. No diastolic murmur. No friction rub. No gallop. No S3 or S4  sounds.   Pulmonary:     Effort: Pulmonary effort is normal. No respiratory distress.     Breath sounds: Normal breath sounds. No decreased air movement or transmitted upper airway sounds. No decreased breath sounds, wheezing, rhonchi or rales.  Abdominal:     General: Bowel sounds are normal.     Palpations: Abdomen is soft. There is no mass.     Tenderness: There is no abdominal tenderness. There is no guarding or rebound.  Musculoskeletal: Normal range of motion.        General: No tenderness.     Right lower leg: No edema.     Left lower leg: No edema.  Lymphadenopathy:     Cervical: No cervical adenopathy.  Skin:    General: Skin is warm.     Findings: No rash.  Neurological:  Mental Status: She is alert and oriented to person, place, and time.     Cranial Nerves: No cranial nerve deficit.     Deep Tendon Reflexes: Reflexes normal.  Psychiatric:        Mood and Affect: Mood is not anxious or depressed.     Wt Readings from Last 3 Encounters:  10/17/18 283 lb (128.4 kg)  04/17/18 272 lb (123.4 kg)  10/15/17 279 lb (126.6 kg)    BP 120/90    Pulse 72    Ht 5\' 3"  (1.6 m)    Wt 283 lb (128.4 kg)    BMI 50.13 kg/m   Assessment and Plan: 1. Essential hypertension Chronic.  Controlled.  Continue hydrochlorothiazide 25 mg once a day and telmisartan 40 mg once a day.  Will check renal function panel.  Will recheck patient in 6 months - Renal Function Panel - hydrochlorothiazide (HYDRODIURIL) 25 MG tablet; TAKE 1 TABLET BY MOUTH EVERY DAY  Dispense: 90 tablet; Refill: 1 - telmisartan (MICARDIS) 40 MG tablet; TAKE 1 TABLET BY MOUTH EVERY DAY  Dispense: 90 tablet; Refill: 1  2. Hyperlipidemia, unspecified hyperlipidemia type Chronic.  Controlled.  Will check lipid panel. - Lipid Panel With LDL/HDL Ratio

## 2018-10-18 LAB — RENAL FUNCTION PANEL
Albumin: 4 g/dL (ref 3.8–4.8)
BUN/Creatinine Ratio: 8 — ABNORMAL LOW (ref 12–28)
BUN: 8 mg/dL (ref 8–27)
CO2: 25 mmol/L (ref 20–29)
Calcium: 9.1 mg/dL (ref 8.7–10.3)
Chloride: 101 mmol/L (ref 96–106)
Creatinine, Ser: 1 mg/dL (ref 0.57–1.00)
GFR calc Af Amer: 70 mL/min/{1.73_m2} (ref >59)
GFR calc non Af Amer: 61 mL/min/{1.73_m2} (ref >59)
Glucose: 90 mg/dL (ref 65–99)
Phosphorus: 4.3 mg/dL (ref 3.0–4.3)
Potassium: 4.2 mmol/L (ref 3.5–5.2)
Sodium: 139 mmol/L (ref 134–144)

## 2018-10-18 LAB — LIPID PANEL WITH LDL/HDL RATIO
Cholesterol, Total: 194 mg/dL (ref 100–199)
HDL: 52 mg/dL (ref >39)
LDL Calculated: 114 mg/dL — ABNORMAL HIGH (ref 0–99)
LDl/HDL Ratio: 2.2 ratio (ref 0.0–3.2)
Triglycerides: 138 mg/dL (ref 0–149)
VLDL Cholesterol Cal: 28 mg/dL (ref 5–40)

## 2018-10-29 ENCOUNTER — Other Ambulatory Visit: Payer: Self-pay | Admitting: Family Medicine

## 2018-10-29 DIAGNOSIS — J4520 Mild intermittent asthma, uncomplicated: Secondary | ICD-10-CM

## 2018-11-14 DIAGNOSIS — M1711 Unilateral primary osteoarthritis, right knee: Secondary | ICD-10-CM | POA: Diagnosis not present

## 2018-11-25 DIAGNOSIS — H401131 Primary open-angle glaucoma, bilateral, mild stage: Secondary | ICD-10-CM | POA: Diagnosis not present

## 2018-12-13 ENCOUNTER — Other Ambulatory Visit: Payer: Self-pay | Admitting: Radiology

## 2018-12-13 DIAGNOSIS — Z20822 Contact with and (suspected) exposure to covid-19: Secondary | ICD-10-CM

## 2018-12-14 LAB — NOVEL CORONAVIRUS, NAA: SARS-CoV-2, NAA: NOT DETECTED

## 2019-03-01 ENCOUNTER — Other Ambulatory Visit: Payer: Self-pay

## 2019-03-01 ENCOUNTER — Ambulatory Visit
Admission: EM | Admit: 2019-03-01 | Discharge: 2019-03-01 | Disposition: A | Discharge status: Home / Self Care | Payer: Medicare Other | Attending: Family Medicine | Admitting: Family Medicine

## 2019-03-01 ENCOUNTER — Encounter: Payer: Self-pay | Admitting: Emergency Medicine

## 2019-03-01 DIAGNOSIS — M25512 Pain in left shoulder: Secondary | ICD-10-CM

## 2019-03-01 MED ORDER — ETODOLAC 500 MG PO TABS: 500.0000 mg | ORAL_TABLET | Freq: Two times a day (BID) | ORAL | 0 refills | Status: DC | PRN

## 2019-03-01 NOTE — Discharge Instructions (Signed)
Rest.  Ice.  Medication as prescribed.  If persists, see Dr. Rudene Christians  Take care  Dr. Lacinda Axon

## 2019-03-01 NOTE — ED Triage Notes (Signed)
Patient c/o left shoulder pain and numbness in her hand for a week.  Patient denies chest pain.

## 2019-03-01 NOTE — ED Provider Notes (Signed)
[facility]    CSN: [id number] Arrival date & time: 03/01/19  1314  History   Chief Complaint Chief Complaint  Patient presents with  . Shoulder Pain    left   HPI  63 year old female presents with left shoulder pain.  2-week history of left shoulder pain.  Patient states that her pain is located diffusely throughout the left shoulder.  Patient reports that the pain radiates down her arm.  She rates her pain as 8/10 in severity.  She has taken some aspirin without relief.  No other medications or interventions tried.  She states that is worse with range of motion of her shoulder and with lying down.  No relieving factors.  No recent trauma, fall, injury.  No known inciting factor.  No other complaints.  PMH, Surgical Hx, Family Hx, Social History reviewed and updated as below.  Past Medical History:  Diagnosis Date  . Anxiety   . Depression   . GERD (gastroesophageal reflux disease)   . Hypertension    Patient Active Problem List   Diagnosis Date Noted  . Pure hypercholesterolemia 04/16/2017  . Hyperlipidemia 04/18/2016  . DDD (degenerative disc disease), lumbosacral 10/17/2015  . Esophageal reflux 04/18/2015  . Essential hypertension 04/18/2015  . Annual physical exam 04/18/2015  . Acid reflux 09/01/2014  . Routine general medical examination at a health care facility 09/01/2014  . Glaucoma 09/01/2014  . BP (high blood pressure) 09/01/2014  . Extreme obesity 09/01/2014  . Arthritis of knee, degenerative 09/09/2013   Past Surgical History:  Procedure Laterality Date  . OOPHORECTOMY    . TOTAL KNEE ARTHROPLASTY Left 02/29/2015  . VAGINAL HYSTERECTOMY     OB History   No obstetric history on file.    Home Medications    Prior to Admission medications   Medication Sig Start Date End Date Taking? Authorizing Provider  ALPRAZolam Prudy Feeler(XANAX) 1 MG tablet Take 1 tablet by mouth at bedtime. psych   Yes [provider]  azelastine (ASTELIN) 0.1 %  nasal spray Dr Andee PolesVaught 02/08/18  Yes [provider]  budesonide-formoterol (SYMBICORT) 160-4.5 MCG/ACT inhaler Inhale into the lungs. Dr Andee PolesVaught 10/29/17 03/01/19 Yes [provider]  dexlansoprazole (DEXILANT) 60 MG capsule Take by mouth. Dr Andee PolesVaught 02/10/18  Yes [provider]  escitalopram (LEXAPRO) 5 MG tablet Take 1 tablet by mouth daily. psych   Yes [provider]  hydrochlorothiazide (HYDRODIURIL) 25 MG tablet TAKE 1 TABLET BY MOUTH EVERY DAY 10/17/18  Yes Duanne LimerickJones, Deanna C, MD  latanoprost (XALATAN) 0.005 % ophthalmic solution Dr Inez PilgrimBrasington 08/28/13  Yes [provider]  montelukast (SINGULAIR) 10 MG tablet Take 1 tablet by mouth daily. 08/31/17 03/01/19 Yes [provider]  PROAIR HFA 108 (90 Base) MCG/ACT inhaler INHALE 1-2 PUFFS INTO THE LUNGS EVERY 6 (SIX) HOURS AS NEEDED FOR WHEEZING OR SHORTNESS OF BREATH. 10/29/18  Yes Elizabeth SauerJones, Deanna C, MD  telmisartan (MICARDIS) 40 MG tablet TAKE 1 TABLET BY MOUTH EVERY DAY 10/17/18  Yes Duanne LimerickJones, Deanna C, MD  timolol (TIMOPTIC) 0.5 % ophthalmic solution Dr Inez PilgrimBrasington 03/07/17  Yes [provider]  budesonide (PULMICORT) 0.5 MG/2ML nebulizer solution Dr Andee PolesVaught 11/29/17   [provider]  etodolac (LODINE) 500 MG tablet Take 1 tablet (500 mg total) by mouth 2 (two) times daily as needed (Pain). 03/01/19   Tommie Samsook, Billiejo Sorto G, DO    Family History Family History  Problem Relation Age of Onset  . Cancer Father   . Heart disease Father   .  Cancer Maternal Aunt   . Breast cancer Maternal Aunt 83  . Cancer Paternal Aunt   . Breast cancer Paternal Aunt 40  . Cancer Paternal Uncle   . Breast cancer Sister 40    Social History Social History   Tobacco Use  . Smoking status: Never Smoker  . Smokeless tobacco: Never Used  Substance Use Topics  . Alcohol use: No    Alcohol/week: 0.0 standard drinks  . Drug use: No     Allergies   Amoxicillin-pot clavulanate   Review of Systems Review of  Systems  Constitutional: Negative.   Musculoskeletal:       Left shoulder pain.   Physical Exam Triage Vital Signs ED Triage Vitals  Enc Vitals Group     BP 03/01/19 1356 (!) 130/101     Pulse Rate 03/01/19 1356 66     Resp 03/01/19 1356 16     Temp 03/01/19 1356 98.9 F (37.2 C)     Temp Source 03/01/19 1356 Oral     SpO2 03/01/19 1356 99 %     Weight 03/01/19 1351 250 lb (113.4 kg)     Height 03/01/19 1351 5\' 3"  (1.6 m)     Head Circumference --      Peak Flow --      Pain Score 03/01/19 1351 8     Pain Loc --      Pain Edu? --      Excl. in GC? --    Updated Vital Signs BP (!) 130/101 (BP Location: Right Arm)   Pulse 66   Temp 98.9 F (37.2 C) (Oral)   Resp 16   Ht 5\' 3"  (1.6 m)   Wt 113.4 kg   SpO2 99%   BMI 44.29 kg/m   Visual Acuity Right Eye Distance:   Left Eye Distance:   Bilateral Distance:    Right Eye Near:   Left Eye Near:    Bilateral Near:     Physical Exam Vitals signs and nursing note reviewed.  Constitutional:      General: She is not in acute distress.    Appearance: Normal appearance. She is obese. She is not ill-appearing.  HENT:     Head: Normocephalic and atraumatic.  Eyes:     General:        Right eye: No discharge.        Left eye: No discharge.     Conjunctiva/sclera: Conjunctivae normal.  Cardiovascular:     Rate and Rhythm: Normal rate and regular rhythm.     Heart sounds: No murmur.  Pulmonary:     Effort: Pulmonary effort is normal.     Breath sounds: Normal breath sounds. No wheezing, rhonchi or rales.  Musculoskeletal:     Comments: Left shoulder -decreased range of motion in flexion.  Positive Hawkins.  Tenderness diffusely.  Rotator cuff strength appears to be intact.  Neurological:     Mental Status: She is alert.  Psychiatric:        Mood and Affect: Mood normal.        Behavior: Behavior normal.    UC Treatments / Results  Labs (all labs ordered are listed, but only abnormal results are displayed) Labs  Reviewed - No data to display  EKG   Radiology No results found.  Procedures Procedures (including critical care time)  Medications Ordered in UC Medications - No data to display  Initial Impression / Assessment and Plan / UC Course  I have reviewed the  triage vital signs and the nursing notes.  Pertinent labs & imaging results that were available during my care of the patient were reviewed by me and considered in my medical decision making (see chart for details).    63 year old female presents with left shoulder pain.  Suspect rotator cuff pathology.  Etodolac as prescribed.  Advised to see Ortho.  Final Clinical Impressions(s) / UC Diagnoses   Final diagnoses:  Acute pain of left shoulder     Discharge Instructions     Rest.  Ice.  Medication as prescribed.  If persists, see Dr. Rudene Christians  Take care  Dr. Lacinda Axon    ED Prescriptions    Medication Sig [place]. Provider   etodolac (LODINE) 500 MG tablet Take 1 tablet (500 mg total) by mouth 2 (two) times daily as needed (Pain). 30 tablet Coral Spikes, DO     PDMP not reviewed this encounter.   Coral Spikes, [place] 03/01/19 1621

## 2019-03-16 ENCOUNTER — Other Ambulatory Visit: Payer: Self-pay

## 2019-03-16 DIAGNOSIS — Z20822 Contact with and (suspected) exposure to covid-19: Secondary | ICD-10-CM

## 2019-03-18 LAB — NOVEL CORONAVIRUS, NAA: SARS-CoV-2, NAA: NOT DETECTED

## 2019-04-18 ENCOUNTER — Other Ambulatory Visit: Payer: Self-pay | Admitting: Family Medicine

## 2019-04-18 DIAGNOSIS — I10 Essential (primary) hypertension: Secondary | ICD-10-CM

## 2019-04-21 ENCOUNTER — Other Ambulatory Visit: Payer: Self-pay | Admitting: Family Medicine

## 2019-04-21 ENCOUNTER — Other Ambulatory Visit: Payer: Self-pay

## 2019-04-21 ENCOUNTER — Ambulatory Visit (INDEPENDENT_AMBULATORY_CARE_PROVIDER_SITE_OTHER): Payer: Medicare Other | Admitting: Family Medicine

## 2019-04-21 ENCOUNTER — Encounter: Payer: Self-pay | Admitting: Family Medicine

## 2019-04-21 VITALS — BP 120/80 | HR 78 | Ht 63.0 in | Wt 275.0 lb

## 2019-04-21 DIAGNOSIS — Z1231 Encounter for screening mammogram for malignant neoplasm of breast: Secondary | ICD-10-CM | POA: Diagnosis not present

## 2019-04-21 DIAGNOSIS — E669 Obesity, unspecified: Secondary | ICD-10-CM

## 2019-04-21 DIAGNOSIS — E7800 Pure hypercholesterolemia, unspecified: Secondary | ICD-10-CM | POA: Diagnosis not present

## 2019-04-21 DIAGNOSIS — E668 Other obesity: Secondary | ICD-10-CM

## 2019-04-21 DIAGNOSIS — J4520 Mild intermittent asthma, uncomplicated: Secondary | ICD-10-CM

## 2019-04-21 DIAGNOSIS — I10 Essential (primary) hypertension: Secondary | ICD-10-CM

## 2019-04-21 DIAGNOSIS — Z1211 Encounter for screening for malignant neoplasm of colon: Secondary | ICD-10-CM | POA: Diagnosis not present

## 2019-04-21 DIAGNOSIS — Z0000 Encounter for general adult medical examination without abnormal findings: Secondary | ICD-10-CM | POA: Diagnosis not present

## 2019-04-21 MED ORDER — ALBUTEROL SULFATE HFA 108 (90 BASE) MCG/ACT IN AERS: INHALATION_SPRAY | RESPIRATORY_TRACT | 11 refills | Status: DC

## 2019-04-21 MED ORDER — HYDROCHLOROTHIAZIDE 25 MG PO TABS: ORAL_TABLET | ORAL | 1 refills | Status: DC

## 2019-04-21 MED ORDER — TELMISARTAN 40 MG PO TABS: 40.0000 mg | ORAL_TABLET | Freq: Every day | ORAL | 1 refills | Status: DC

## 2019-04-21 NOTE — Patient Instructions (Signed)
° °Calorie Counting for Weight Loss °Calories are units of energy. Your body needs a certain amount of calories from food to keep you going throughout the day. When you eat more calories than your body needs, your body stores the extra calories as fat. When you eat fewer calories than your body needs, your body burns fat to get the energy it needs. °Calorie counting means keeping track of how many calories you eat and drink each day. Calorie counting can be helpful if you need to lose weight. If you make sure to eat fewer calories than your body needs, you should lose weight. Ask your health care provider what a healthy weight is for you. °For calorie counting to work, you will need to eat the right number of calories in a day in order to lose a healthy amount of weight per week. A dietitian can help you determine how many calories you need in a day and will give you suggestions on how to reach your calorie goal. °· A healthy amount of weight to lose per week is usually 1-2 lb (0.5-0.9 kg). This usually means that your daily calorie intake should be reduced by 500-750 calories. °· Eating 1,200 - 1,500 calories per day can help most women lose weight. °· Eating 1,500 - 1,800 calories per day can help most men lose weight. °What is my plan? °My goal is to have __________ calories per day. °If I have this many calories per day, I should lose around __________ pounds per week. °What do I need to know about calorie counting? °In order to meet your daily calorie goal, you will need to: °· Find out how many calories are in each food you would like to eat. Try to do this before you eat. °· Decide how much of the food you plan to eat. °· Write down what you ate and how many calories it had. Doing this is called keeping a food log. °To successfully lose weight, it is important to balance calorie counting with a healthy lifestyle that includes regular activity. Aim for 150 minutes of moderate exercise (such as walking) or 75  minutes of vigorous exercise (such as running) each week. °Where do I find calorie information? ° °The number of calories in a food can be found on a Nutrition Facts label. If a food does not have a Nutrition Facts label, try to look up the calories online or ask your dietitian for help. °Remember that calories are listed per serving. If you choose to have more than one serving of a food, you will have to multiply the calories per serving by the amount of servings you plan to eat. For example, the label on a package of bread might say that a serving size is 1 slice and that there are 90 calories in a serving. If you eat 1 slice, you will have eaten 90 calories. If you eat 2 slices, you will have eaten 180 calories. °How do I keep a food log? °Immediately after each meal, record the following information in your food log: °· What you ate. Don't forget to include toppings, sauces, and other extras on the food. °· How much you ate. This can be measured in cups, ounces, or number of items. °· How many calories each food and drink had. °· The total number of calories in the meal. °Keep your food log near you, such as in a small notebook in your pocket, or use a mobile app or website. Some programs will   calculate calories for you and show you how many calories you have left for the day to meet your goal. °What are some calorie counting tips? ° °· Use your calories on foods and drinks that will fill you up and not leave you hungry: °? Some examples of foods that fill you up are nuts and nut butters, vegetables, lean proteins, and high-fiber foods like whole grains. High-fiber foods are foods with more than 5 g fiber per serving. °? Drinks such as sodas, specialty coffee drinks, alcohol, and juices have a lot of calories, yet do not fill you up. °· Eat nutritious foods and avoid empty calories. Empty calories are calories you get from foods or beverages that do not have many vitamins or protein, such as candy, sweets, and  soda. It is better to have a nutritious high-calorie food (such as an avocado) than a food with few nutrients (such as a bag of chips). °· Know how many calories are in the foods you eat most often. This will help you calculate calorie counts faster. °· Pay attention to calories in drinks. Low-calorie drinks include water and unsweetened drinks. °· Pay attention to nutrition labels for "low fat" or "fat free" foods. These foods sometimes have the same amount of calories or more calories than the full fat versions. They also often have added sugar, starch, or salt, to make up for flavor that was removed with the fat. °· Find a way of tracking calories that works for you. Get creative. Try different apps or programs if writing down calories does not work for you. °What are some portion control tips? °· Know how many calories are in a serving. This will help you know how many servings of a certain food you can have. °· Use a measuring cup to measure serving sizes. You could also try weighing out portions on a kitchen scale. With time, you will be able to estimate serving sizes for some foods. °· Take some time to put servings of different foods on your favorite plates, bowls, and cups so you know what a serving looks like. °· Try not to eat straight from a bag or box. Doing this can lead to overeating. Put the amount you would like to eat in a cup or on a plate to make sure you are eating the right portion. °· Use smaller plates, glasses, and bowls to prevent overeating. °· Try not to multitask (for example, watch TV or use your computer) while eating. If it is time to eat, sit down at a table and enjoy your food. This will help you to know when you are full. It will also help you to be aware of what you are eating and how much you are eating. °What are tips for following this plan? °Reading food labels °· Check the calorie count compared to the serving size. The serving size may be smaller than what you are used to  eating. °· Check the source of the calories. Make sure the food you are eating is high in vitamins and protein and low in saturated and trans fats. °Shopping °· Read nutrition labels while you shop. This will help you make healthy decisions before you decide to purchase your food. °· Make a grocery list and stick to it. °Cooking °· Try to cook your favorite foods in a healthier way. For example, try baking instead of frying. °· Use low-fat dairy products. °Meal planning °· Use more fruits and vegetables. Half of your plate should be   fruits and vegetables. °· Include lean proteins like poultry and fish. °How do I count calories when eating out? °· Ask for smaller portion sizes. °· Consider sharing an entree and sides instead of getting your own entree. °· If you get your own entree, eat only half. Ask for a box at the beginning of your meal and put the rest of your entree in it so you are not tempted to eat it. °· If calories are listed on the menu, choose the lower calorie options. °· Choose dishes that include vegetables, fruits, whole grains, low-fat dairy products, and lean protein. °· Choose items that are boiled, broiled, grilled, or steamed. Stay away from items that are buttered, battered, fried, or served with cream sauce. Items labeled "crispy" are usually fried, unless stated otherwise. °· Choose water, low-fat milk, unsweetened iced tea, or other drinks without added sugar. If you want an alcoholic beverage, choose a lower calorie option such as a glass of wine or light beer. °· Ask for dressings, sauces, and syrups on the side. These are usually high in calories, so you should limit the amount you eat. °· If you want a salad, choose a garden salad and ask for grilled meats. Avoid extra toppings like bacon, cheese, or fried items. Ask for the dressing on the side, or ask for olive oil and vinegar or lemon to use as dressing. °· Estimate how many servings of a food you are given. For example, a serving of  cooked rice is ½ cup or about the size of half a baseball. Knowing serving sizes will help you be aware of how much food you are eating at restaurants. The list below tells you how big or small some common portion sizes are based on everyday objects: °? 1 oz--4 stacked dice. °? 3 oz--1 deck of cards. °? 1 tsp--1 die. °? 1 Tbsp--½ a ping-pong ball. °? 2 Tbsp--1 ping-pong ball. °? ½ cup--½ baseball. °? 1 cup--1 baseball. °Summary °· Calorie counting means keeping track of how many calories you eat and drink each day. If you eat fewer calories than your body needs, you should lose weight. °· A healthy amount of weight to lose per week is usually 1-2 lb (0.5-0.9 kg). This usually means reducing your daily calorie intake by 500-750 calories. °· The number of calories in a food can be found on a Nutrition Facts label. If a food does not have a Nutrition Facts label, try to look up the calories online or ask your dietitian for help. °· Use your calories on foods and drinks that will fill you up, and not on foods and drinks that will leave you hungry. °· Use smaller plates, glasses, and bowls to prevent overeating. °This information is not intended to replace advice given to you by your health care provider. Make sure you discuss any questions you have with your health care provider. °Document Released: 04/16/2005 Document Revised: 01/03/2018 Document Reviewed: 03/16/2016 °Elsevier Patient Education © 2020 [employer] ° °

## 2019-04-21 NOTE — Progress Notes (Signed)
Date:  04/21/2019   Name:  Diane Small   DOB:  12/20/1955   MRN:  [id number]   Chief Complaint: Annual Exam (no pap needs mammo) and ref colonoscopy (wants wohl- [facility])  Patient is a 63 year old female who presents for a wellness physical exam. The patient reports the following problems: none. Health maintenance has been reviewed colonoscopy. Asthma There is no cough, shortness of breath or wheezing. The current episode started more than 1 year ago. The problem has been gradually improving. Pertinent negatives include no chest pain, ear pain, fever, headaches, myalgias, PND, rhinorrhea, sneezing or sore throat. Her symptoms are alleviated by beta-agonist. Her past medical history is significant for asthma.  Hypertension This is a chronic problem. The current episode started more than 1 year ago. The problem has been gradually improving since onset. The problem is controlled. Associated symptoms include anxiety. Pertinent negatives include no chest pain, headaches, orthopnea, palpitations, PND or shortness of breath. There are no associated agents to hypertension. Past treatments include diuretics and angiotensin blockers. The current treatment provides moderate improvement. There are no compliance problems.  There is no history of chronic renal disease, a hypertension causing med or renovascular disease.    Lab Results  Component Value Date   CREATININE 1.00 10/17/2018   BUN 8 10/17/2018   NA 139 10/17/2018   K 4.2 10/17/2018   CL 101 10/17/2018   CO2 25 10/17/2018   Lab Results  Component Value Date   CHOL 194 10/17/2018   HDL 52 10/17/2018   LDLCALC 114 (H) 10/17/2018   TRIG 138 10/17/2018   CHOLHDL 3.2 10/15/2017   Lab Results  Component Value Date   TSH 1.210 10/15/2017   No results found for: HGBA1C   Review of Systems  Constitutional: Negative.  Negative for chills, fatigue, fever and unexpected weight change.  HENT: Negative for  congestion, ear discharge, ear pain, rhinorrhea, sinus pressure, sneezing and sore throat.   Eyes: Negative for photophobia, pain, discharge, redness and itching.  Respiratory: Negative for cough, shortness of breath, wheezing and stridor.   Cardiovascular: Negative for chest pain, palpitations, orthopnea, leg swelling and PND.  Gastrointestinal: Negative for abdominal pain, blood in stool, constipation, diarrhea, nausea and vomiting.  Endocrine: Negative for cold intolerance, heat intolerance, polydipsia, polyphagia and polyuria.  Genitourinary: Negative for dysuria, flank pain, frequency, hematuria, menstrual problem, pelvic pain, urgency, vaginal bleeding and vaginal discharge.  Musculoskeletal: Negative for arthralgias, back pain and myalgias.  Skin: Negative for rash.  Allergic/Immunologic: Negative for environmental allergies and food allergies.  Neurological: Negative for dizziness, weakness, light-headedness, numbness and headaches.  Hematological: Negative for adenopathy. Does not bruise/bleed easily.  Psychiatric/Behavioral: Negative for dysphoric mood. The patient is not nervous/anxious.     Patient Active Problem List   Diagnosis Date Noted  . Pure hypercholesterolemia 04/16/2017  . Hyperlipidemia 04/18/2016  . DDD (degenerative disc disease), lumbosacral 10/17/2015  . Esophageal reflux 04/18/2015  . Essential hypertension 04/18/2015  . Annual physical exam 04/18/2015  . Acid reflux 09/01/2014  . Routine general medical examination at a health care facility 09/01/2014  . Glaucoma 09/01/2014  . BP (high blood pressure) 09/01/2014  . Extreme obesity 09/01/2014  . Arthritis of knee, degenerative 09/09/2013    Allergies  Allergen Reactions  . Amoxicillin-Pot Clavulanate Diarrhea    Past Surgical History:  Procedure Laterality Date  . OOPHORECTOMY    . TOTAL KNEE ARTHROPLASTY Left 02/29/2015  . VAGINAL HYSTERECTOMY  Social History   Tobacco Use  . Smoking  status: Never Smoker  . Smokeless tobacco: Never Used  Substance Use Topics  . Alcohol use: No    Alcohol/week: 0.0 standard drinks  . Drug use: No     Medication list has been reviewed and updated.  Current Meds  Medication Sig  . albuterol (VENTOLIN HFA) 108 (90 Base) MCG/ACT inhaler INHALE 1-2 PUFFS INTO THE LUNGS EVERY 6 (SIX) HOURS AS NEEDED FOR WHEEZING OR SHORTNESS OF BREATH.  Marland Kitchen ALPRAZolam (XANAX) 1 MG tablet Take 1 tablet by mouth at bedtime. psych  . azelastine (ASTELIN) 0.1 % nasal spray Dr Pryor Ochoa  . budesonide (PULMICORT) 0.5 MG/2ML nebulizer solution Dr Pryor Ochoa  . budesonide-formoterol (SYMBICORT) 160-4.5 MCG/ACT inhaler Inhale into the lungs. Dr Pryor Ochoa  . dexlansoprazole (DEXILANT) 60 MG capsule Take by mouth. Dr Pryor Ochoa  . escitalopram (LEXAPRO) 5 MG tablet Take 1 tablet by mouth daily. psych  . hydrochlorothiazide (HYDRODIURIL) 25 MG tablet TAKE 1 TABLET BY MOUTH EVERY DAY  . latanoprost (XALATAN) 0.005 % ophthalmic solution Dr Wallace Going  . montelukast (SINGULAIR) 10 MG tablet Take 1 tablet by mouth daily.  Marland Kitchen telmisartan (MICARDIS) 40 MG tablet TAKE 1 TABLET BY MOUTH EVERY DAY  . timolol (TIMOPTIC) 0.5 % ophthalmic solution Dr Wallace Going    [facility] 2/9 Scores 10/17/2018 04/17/2018 10/15/2017 10/17/2015  PHQ - 2 Score 0 0 0 0  PHQ- 9 Score 0 0 2 -    BP Readings from Last 3 Encounters:  04/21/19 120/80  03/01/19 (!) 130/101  10/17/18 120/90    Physical Exam Vitals and nursing note reviewed.  Constitutional:      General: She is not in acute distress.    Appearance: She is obese. She is not diaphoretic.  HENT:     Head: Normocephalic and atraumatic.     Jaw: There is normal jaw occlusion.     Salivary Glands: Right salivary gland is not diffusely enlarged or tender. Left salivary gland is not diffusely enlarged or tender.     Right Ear: Hearing, tympanic membrane, ear canal and external ear normal.     Left Ear: Hearing, tympanic membrane, ear canal and external  ear normal.     Nose: Nose normal.     Right Turbinates: Not enlarged, swollen or pale.     Left Turbinates: Not enlarged, swollen or pale.     Mouth/Throat:     Lips: Pink.     Mouth: Mucous membranes are moist.     Dentition: Normal dentition.     Tongue: No lesions.     Palate: No mass.     Pharynx: Oropharynx is clear. Uvula midline.  Eyes:     General: Lids are normal. Vision grossly intact. Gaze aligned appropriately.        Right eye: No discharge.        Left eye: No discharge.     Extraocular Movements: Extraocular movements intact.     Conjunctiva/sclera: Conjunctivae normal.     Pupils: Pupils are equal, round, and reactive to light.  Neck:     Thyroid: No thyroid mass, thyromegaly or thyroid tenderness.     Vascular: Normal carotid pulses. No carotid bruit, hepatojugular reflux or JVD.     Trachea: Trachea and phonation normal.  Cardiovascular:     Rate and Rhythm: Normal rate and regular rhythm.  No extrasystoles are present.    Chest Wall: PMI is not displaced. No thrill.     Pulses: Normal pulses.  Carotid pulses are 2+ on the right side and 2+ on the left side.      Radial pulses are 2+ on the right side and 2+ on the left side.       Femoral pulses are 2+ on the right side and 2+ on the left side.      Popliteal pulses are 2+ on the right side and 2+ on the left side.       Dorsalis pedis pulses are 2+ on the right side and 2+ on the left side.       Posterior tibial pulses are 2+ on the right side and 2+ on the left side.     Heart sounds: Normal heart sounds, S1 normal and S2 normal. No murmur. No systolic murmur. No diastolic murmur. No friction rub. No gallop. No S3 or S4 sounds.   Pulmonary:     Effort: Pulmonary effort is normal.     Breath sounds: Normal breath sounds and air entry. No decreased air movement. No decreased breath sounds, wheezing, rhonchi or rales.  Chest:     Breasts: Breasts are symmetrical.        Right: No swelling, bleeding,  inverted nipple, mass, nipple discharge, skin change or tenderness.        Left: No swelling, bleeding, inverted nipple, mass, nipple discharge, skin change or tenderness.  Abdominal:     General: Bowel sounds are normal.     Palpations: Abdomen is soft. There is no hepatomegaly, splenomegaly or mass.     Tenderness: There is no abdominal tenderness. There is no right CVA tenderness, left CVA tenderness, guarding or rebound.  Genitourinary:    Rectum: Normal. Guaiac result negative. No mass.  Musculoskeletal:        General: Normal range of motion.     Cervical back: Normal, full passive range of motion without pain, normal range of motion and neck supple.     Thoracic back: Normal.     Lumbar back: Normal.     Right lower leg: No edema.     Left lower leg: No edema.  Lymphadenopathy:     Head:     Right side of head: No submandibular or tonsillar adenopathy.     Left side of head: No submandibular or tonsillar adenopathy.     Cervical: No cervical adenopathy.     Right cervical: No superficial, deep or posterior cervical adenopathy.    Left cervical: No superficial, deep or posterior cervical adenopathy.     Upper Body:     Right upper body: No supraclavicular or axillary adenopathy.     Left upper body: No supraclavicular or axillary adenopathy.  Skin:    General: Skin is warm and dry.     Capillary Refill: Capillary refill takes less than 2 seconds.  Neurological:     Mental Status: She is alert.     Cranial Nerves: Cranial nerves are intact.     Sensory: Sensation is intact.     Motor: Motor function is intact.     Deep Tendon Reflexes: Reflexes are normal and symmetric.  Psychiatric:        Behavior: Behavior is cooperative.     Wt Readings from Last 3 Encounters:  04/21/19 275 lb (124.7 kg)  03/01/19 250 lb (113.4 kg)  10/17/18 283 lb (128.4 kg)    BP 120/80   Pulse 78   Ht  (1.6 m)   Wt 275 lb (124.7 kg)   BMI 48.71 kg/m  Assessment and Plan:  1.  Breast cancer screening by mammogram Exam was normal with no palpable mass.  Patient will be referred for 3D screening breast bilateral - MM 3D SCREEN BREAST BILATERAL; Future  2. Annual physical exam No subjective/objective concerns noted during history and physical exam.  Patient's previous encounter most recent lab, and any imagings or care elsewhere were reviewed.Diane Small is a 63 y.o. female who presents today for her Complete Annual Exam. She feels well. She reports exercising . She reports she is sleeping well. Immunizations are reviewed and recommendations provided.   Age appropriate screening tests are discussed. Counseling given for risk factor reduction interventions.  3. Colon cancer screening DRE was normal with no palpable mass and guaiac was negative.  Patient is referred to gastroenterology for screening colonoscopy. - Ambulatory referral to Gastroenterology  4. Essential hypertension Chronic.  Controlled.  Stable.  Continue hydrochlorothiazide 25 mg and myocarditis 40 mg daily.  Will check renal function panel.  Will recheck in 6 months. - hydrochlorothiazide (HYDRODIURIL) 25 MG tablet; TAKE 1 TABLET BY MOUTH EVERY DAY  Dispense: 90 tablet; Refill: 1 - telmisartan (MICARDIS) 40 MG tablet; Take 1 tablet (40 mg total) by mouth daily.  Dispense: 90 tablet; Refill: 1 - Renal Function Panel  5. Mild intermittent asthma, unspecified whether complicated Chronic.  Controlled.  Intermittent.  Mild in nature.  We will continue albuterol inhaler 1 to 2 puffs every 6 hours as needed for wheezing. - albuterol (VENTOLIN HFA) 108 (90 Base) MCG/ACT inhaler; INHALE 1-2 PUFFS INTO THE LUNGS EVERY 6 (SIX) HOURS AS NEEDED FOR WHEEZING OR SHORTNESS OF BREATH.  Dispense: 8 g; Refill: 11  6. Pure hypercholesterolemia Chronic.  Controlled.  Stable.  Patient is supposed to be on low-cholesterol diet which is questionable.  Will check lipid panel with LDL/HDL ratio.  Patient was given diet  to lose weight with calorie counting to the 1500-calorie level. - Lipid Panel With LDL/HDL Ratio  7. Extreme obesity Health risks of being over weight were discussed and patient was counseled on weight loss options and exercise.

## 2019-04-22 LAB — RENAL FUNCTION PANEL
Albumin: 4.2 g/dL (ref 3.8–4.8)
BUN/Creatinine Ratio: 9 — ABNORMAL LOW (ref 12–28)
BUN: 8 mg/dL (ref 8–27)
CO2: 26 mmol/L (ref 20–29)
Calcium: 9.8 mg/dL (ref 8.7–10.3)
Chloride: 99 mmol/L (ref 96–106)
Creatinine, Ser: 0.93 mg/dL (ref 0.57–1.00)
GFR calc Af Amer: 76 mL/min/{1.73_m2} (ref >59)
GFR calc non Af Amer: 66 mL/min/{1.73_m2} (ref >59)
Glucose: 106 mg/dL — ABNORMAL HIGH (ref 65–99)
Phosphorus: 4 mg/dL (ref 3.0–4.3)
Potassium: 4.5 mmol/L (ref 3.5–5.2)
Sodium: 141 mmol/L (ref 134–144)

## 2019-04-22 LAB — LIPID PANEL WITH LDL/HDL RATIO
Cholesterol, Total: 191 mg/dL (ref 100–199)
HDL: 45 mg/dL (ref >39)
LDL Chol Calc (NIH): 115 mg/dL — ABNORMAL HIGH (ref 0–99)
LDL/HDL Ratio: 2.6 ratio (ref 0.0–3.2)
Triglycerides: 176 mg/dL — ABNORMAL HIGH (ref 0–149)
VLDL Cholesterol Cal: 31 mg/dL (ref 5–40)

## 2019-04-28 ENCOUNTER — Telehealth: Payer: Self-pay | Admitting: Gastroenterology

## 2019-04-28 NOTE — Telephone Encounter (Signed)
Returned patients call to schedule colonoscopy.  During her triage, she requested an upper endoscopy to re-evaluate hiatal hernia.  Office visit has been made to discuss hiatal hernia, and screening colonoscopy.  Thanks [employer]

## 2019-04-28 NOTE — Telephone Encounter (Signed)
Patient called & l/m on v/m stating she was returning Michelle's call °

## 2019-05-08 ENCOUNTER — Encounter: Payer: Self-pay | Admitting: *Deleted

## 2019-05-19 ENCOUNTER — Ambulatory Visit
Admission: RE | Admit: 2019-05-19 | Discharge: 2019-05-19 | Disposition: A | Discharge status: Home / Self Care | Payer: Medicare Other | Source: Ambulatory Visit | Attending: Family Medicine | Admitting: Family Medicine

## 2019-05-19 ENCOUNTER — Other Ambulatory Visit: Payer: Self-pay

## 2019-05-19 DIAGNOSIS — Z1231 Encounter for screening mammogram for malignant neoplasm of breast: Secondary | ICD-10-CM | POA: Diagnosis not present

## 2019-05-25 ENCOUNTER — Other Ambulatory Visit: Payer: Self-pay | Admitting: Family Medicine

## 2019-05-26 DIAGNOSIS — H401131 Primary open-angle glaucoma, bilateral, mild stage: Secondary | ICD-10-CM | POA: Diagnosis not present

## 2019-05-28 ENCOUNTER — Other Ambulatory Visit: Payer: Self-pay | Admitting: Family Medicine

## 2019-06-17 ENCOUNTER — Other Ambulatory Visit: Payer: Self-pay

## 2019-06-17 ENCOUNTER — Encounter: Payer: Self-pay | Admitting: Gastroenterology

## 2019-06-17 ENCOUNTER — Ambulatory Visit (INDEPENDENT_AMBULATORY_CARE_PROVIDER_SITE_OTHER): Payer: Medicare Other | Admitting: Gastroenterology

## 2019-06-17 VITALS — BP 107/70 | HR 67 | Temp 97.8°F | Ht 63.0 in | Wt 276.8 lb

## 2019-06-17 DIAGNOSIS — K219 Gastro-esophageal reflux disease without esophagitis: Secondary | ICD-10-CM

## 2019-06-17 NOTE — H&P (View-Only) (Signed)
Gastroenterology Consultation  Referring Provider:     Juline Patch, MD Primary Care Physician:  Juline Patch, MD Primary Gastroenterologist:  Dr. Allen Norris     Reason for Consultation:     GERD        HPI:   Diane Small is a 64 y.o. y/o female referred for consultation & management of GERD by Dr. Ronnald Ramp, Iven Finn, MD.  This patient comes in today with a history of having a colonoscopy and EGD in the past.  The patient reports that she has GERD that is well controlled by Dexilant.  The patient wanted to come see me and talk about her GERD and the need for a screening colonoscopy.  The patient's last EGD and colonoscopy were done at [facility] and appear to have been done in 2011.  At that time the patient denies any pathology found except a hiatal hernia.  She has no issues at present time.  Past Medical History:  Diagnosis Date  . Anxiety   . Depression   . GERD (gastroesophageal reflux disease)   . Hypertension     Past Surgical History:  Procedure Laterality Date  . OOPHORECTOMY    . TOTAL KNEE ARTHROPLASTY Left 02/29/2015  . VAGINAL HYSTERECTOMY      Prior to Admission medications   Medication Sig Start Date End Date Taking? Authorizing Provider  albuterol (VENTOLIN HFA) 108 (90 Base) MCG/ACT inhaler INHALE 1-2 PUFFS INTO THE LUNGS EVERY 6 (SIX) HOURS AS NEEDED FOR WHEEZING OR SHORTNESS OF BREATH. 04/21/19  Yes Juline Patch, MD  ALPRAZolam Duanne Moron) 1 MG tablet Take 1 tablet by mouth at bedtime. psych   Yes [provider]  azelastine (ASTELIN) 0.1 % nasal spray Dr Pryor Ochoa 02/08/18  Yes [provider]  budesonide (PULMICORT) 0.5 MG/2ML nebulizer solution Dr Pryor Ochoa 11/29/17  Yes [provider]  dexlansoprazole (DEXILANT) 60 MG capsule Take by mouth. Dr Pryor Ochoa 02/10/18  Yes [provider]  escitalopram (LEXAPRO) 5 MG tablet Take 1 tablet by mouth daily. psych   Yes [provider]  hydrochlorothiazide (HYDRODIURIL) 25 MG  tablet TAKE 1 TABLET BY MOUTH EVERY DAY 04/21/19  Yes Juline Patch, MD  latanoprost (XALATAN) 0.005 % ophthalmic solution Dr Wallace Going 08/28/13  Yes [provider]  telmisartan (MICARDIS) 40 MG tablet Take 1 tablet (40 mg total) by mouth daily. 04/21/19  Yes Juline Patch, MD  timolol (TIMOPTIC) 0.5 % ophthalmic solution Dr Wallace Going 03/07/17  Yes [provider]  budesonide-formoterol (SYMBICORT) 160-4.5 MCG/ACT inhaler Inhale into the lungs. Dr Pryor Ochoa 10/29/17 04/21/19  [provider]  etodolac (LODINE) 500 MG tablet Take 1 tablet (500 mg total) by mouth 2 (two) times daily as needed (Pain). Patient not taking: Reported on 04/21/2019 03/01/19   Coral Spikes, DO  montelukast (SINGULAIR) 10 MG tablet Take 1 tablet by mouth daily. 08/31/17 04/21/19  [provider]    Family History  Problem Relation Age of Onset  . Cancer Father   . Heart disease Father   . Cancer Maternal Aunt   . Breast cancer Maternal Aunt 83  . Cancer Paternal Aunt   . Breast cancer Paternal Aunt 34  . Cancer Paternal Uncle   . Breast cancer Sister 49  . Breast cancer Maternal Aunt 77     Social History   Tobacco Use  . Smoking status: Never Smoker  . Smokeless tobacco: Never Used  Substance Use Topics  . Alcohol use: No  Alcohol/week: 0.0 standard drinks  . Drug use: No    Allergies as of 06/17/2019 - Review Complete 06/17/2019  Allergen Reaction Noted  . Amoxicillin-pot clavulanate Diarrhea 01/22/2017    Review of Systems:    All systems reviewed and negative except where noted in HPI.   Physical Exam:  BP 107/70   Pulse 67   Temp 97.8 F (36.6 C)   Ht 5\' 3"  (1.6 m)   Wt 276 lb 12.8 oz (125.6 kg)   BMI 49.03 kg/m  No LMP recorded. Patient has had a hysterectomy. General:   Alert,  Well-developed, well-nourished, pleasant and cooperative in NAD Head:  Normocephalic and atraumatic. Eyes:  Sclera clear, no icterus.   Conjunctiva pink. Ears:  Normal  auditory acuity. Neck:  Supple; no masses or thyromegaly. Lungs:  Respirations even and unlabored.  Clear throughout to auscultation.   No wheezes, crackles, or rhonchi. No acute distress. Heart:  Regular rate and rhythm; no murmurs, clicks, rubs, or gallops. Abdomen:  Normal bowel sounds.  No bruits.  Soft, non-tender and non-distended without masses, hepatosplenomegaly or hernias noted.  No guarding or rebound tenderness.  Negative Carnett sign.   Rectal:  Deferred.  Pulses:  Normal pulses noted. Extremities:  No clubbing or edema.  No cyanosis. Neurologic:  Alert and oriented x3;  grossly normal neurologically. Skin:  Intact without significant lesions or rashes.  No jaundice. Lymph Nodes:  No significant cervical adenopathy. Psych:  Alert and cooperative. Normal mood and affect.  Imaging Studies: MM 3D SCREEN BREAST BILATERAL  Result Date: 05/19/2019 CLINICAL DATA:  Screening. EXAM: DIGITAL SCREENING BILATERAL MAMMOGRAM WITH TOMO AND CAD COMPARISON:  Previous exam(s). ACR Breast Density Category a: The breast tissue is almost entirely fatty. FINDINGS: There are no findings suspicious for malignancy. Images were processed with CAD. IMPRESSION: No mammographic evidence of malignancy. A result letter of this screening mammogram will be mailed directly to the patient. RECOMMENDATION: Screening mammogram in one year. (Code:SM-B-01Y) BI-RADS CATEGORY  1: Negative. Electronically Signed   By: 05/21/2019 M.D.   On: 05/19/2019 16:22    Assessment and Plan:   Diane Small is a 64 y.o. y/o female who comes in with a history of reflux and a hiatal hernia.  The patient is on Dexilant and the heartburn is under control.  The patient is in need of a screening colonoscopy.  The patient will be set up for a screening colonoscopy and upper endoscopy due to her chronic heartburn.  The patient has been explained the plan and agrees with it.    64, MD. Midge Minium    Note: This  dictation was prepared with Dragon dictation along with smaller phrase technology. Any transcriptional errors that result from this process are unintentional.

## 2019-06-17 NOTE — Progress Notes (Signed)
  Gastroenterology Consultation  Referring Provider:     Jones, Deanna C, MD Primary Care Physician:  Jones, Deanna C, MD Primary Gastroenterologist:  Dr. Suhas Estis     Reason for Consultation:     GERD        HPI:   Diane Small is a 64 y.o. y/o female referred for consultation & management of GERD by Dr. Jones, Deanna C, MD.  This patient comes in today with a history of having a colonoscopy and EGD in the past.  The patient reports that she has GERD that is well controlled by Dexilant.  The patient wanted to come see me and talk about her GERD and the need for a screening colonoscopy.  The patient's last EGD and colonoscopy were done at [facility] and appear to have been done in 2011.  At that time the patient denies any pathology found except a hiatal hernia.  She has no issues at present time.  Past Medical History:  Diagnosis Date  . Anxiety   . Depression   . GERD (gastroesophageal reflux disease)   . Hypertension     Past Surgical History:  Procedure Laterality Date  . OOPHORECTOMY    . TOTAL KNEE ARTHROPLASTY Left 02/29/2015  . VAGINAL HYSTERECTOMY      Prior to Admission medications   Medication Sig Start Date End Date Taking? Authorizing Provider  albuterol (VENTOLIN HFA) 108 (90 Base) MCG/ACT inhaler INHALE 1-2 PUFFS INTO THE LUNGS EVERY 6 (SIX) HOURS AS NEEDED FOR WHEEZING OR SHORTNESS OF BREATH. 04/21/19  Yes Jones, Deanna C, MD  ALPRAZolam (XANAX) 1 MG tablet Take 1 tablet by mouth at bedtime. psych   Yes [provider]  azelastine (ASTELIN) 0.1 % nasal spray Dr Vaught 02/08/18  Yes [provider]  budesonide (PULMICORT) 0.5 MG/2ML nebulizer solution Dr Vaught 11/29/17  Yes [provider]  dexlansoprazole (DEXILANT) 60 MG capsule Take by mouth. Dr Vaught 02/10/18  Yes [provider]  escitalopram (LEXAPRO) 5 MG tablet Take 1 tablet by mouth daily. psych   Yes [provider]  hydrochlorothiazide (HYDRODIURIL) 25 MG  tablet TAKE 1 TABLET BY MOUTH EVERY DAY 04/21/19  Yes Jones, Deanna C, MD  latanoprost (XALATAN) 0.005 % ophthalmic solution Dr Brasington 08/28/13  Yes [provider]  telmisartan (MICARDIS) 40 MG tablet Take 1 tablet (40 mg total) by mouth daily. 04/21/19  Yes Jones, Deanna C, MD  timolol (TIMOPTIC) 0.5 % ophthalmic solution Dr Brasington 03/07/17  Yes [provider]  budesonide-formoterol (SYMBICORT) 160-4.5 MCG/ACT inhaler Inhale into the lungs. Dr Vaught 10/29/17 04/21/19  [provider]  etodolac (LODINE) 500 MG tablet Take 1 tablet (500 mg total) by mouth 2 (two) times daily as needed (Pain). Patient not taking: Reported on 04/21/2019 03/01/19   Cook, Jayce G, DO  montelukast (SINGULAIR) 10 MG tablet Take 1 tablet by mouth daily. 08/31/17 04/21/19  [provider]    Family History  Problem Relation Age of Onset  . Cancer Father   . Heart disease Father   . Cancer Maternal Aunt   . Breast cancer Maternal Aunt 83  . Cancer Paternal Aunt   . Breast cancer Paternal Aunt 40  . Cancer Paternal Uncle   . Breast cancer Sister 50  . Breast cancer Maternal Aunt 77     Social History   Tobacco Use  . Smoking status: Never Smoker  . Smokeless tobacco: Never Used  Substance Use Topics  . Alcohol use: No      Alcohol/week: 0.0 standard drinks  . Drug use: No    Allergies as of 06/17/2019 - Review Complete 06/17/2019  Allergen Reaction Noted  . Amoxicillin-pot clavulanate Diarrhea 01/22/2017    Review of Systems:    All systems reviewed and negative except where noted in HPI.   Physical Exam:  BP 107/70   Pulse 67   Temp 97.8 F (36.6 C)   Ht 5\' 3"  (1.6 m)   Wt 276 lb 12.8 oz (125.6 kg)   BMI 49.03 kg/m  No LMP recorded. Patient has had a hysterectomy. General:   Alert,  Well-developed, well-nourished, pleasant and cooperative in NAD Head:  Normocephalic and atraumatic. Eyes:  Sclera clear, no icterus.   Conjunctiva pink. Ears:  Normal  auditory acuity. Neck:  Supple; no masses or thyromegaly. Lungs:  Respirations even and unlabored.  Clear throughout to auscultation.   No wheezes, crackles, or rhonchi. No acute distress. Heart:  Regular rate and rhythm; no murmurs, clicks, rubs, or gallops. Abdomen:  Normal bowel sounds.  No bruits.  Soft, non-tender and non-distended without masses, hepatosplenomegaly or hernias noted.  No guarding or rebound tenderness.  Negative Carnett sign.   Rectal:  Deferred.  Pulses:  Normal pulses noted. Extremities:  No clubbing or edema.  No cyanosis. Neurologic:  Alert and oriented x3;  grossly normal neurologically. Skin:  Intact without significant lesions or rashes.  No jaundice. Lymph Nodes:  No significant cervical adenopathy. Psych:  Alert and cooperative. Normal mood and affect.  Imaging Studies: MM 3D SCREEN BREAST BILATERAL  Result Date: 05/19/2019 CLINICAL DATA:  Screening. EXAM: DIGITAL SCREENING BILATERAL MAMMOGRAM WITH TOMO AND CAD COMPARISON:  Previous exam(s). ACR Breast Density Category a: The breast tissue is almost entirely fatty. FINDINGS: There are no findings suspicious for malignancy. Images were processed with CAD. IMPRESSION: No mammographic evidence of malignancy. A result letter of this screening mammogram will be mailed directly to the patient. RECOMMENDATION: Screening mammogram in one year. (Code:SM-B-01Y) BI-RADS CATEGORY  1: Negative. Electronically Signed   By: 05/21/2019 M.D.   On: 05/19/2019 16:22    Assessment and Plan:   Diane Small is a 64 y.o. y/o female who comes in with a history of reflux and a hiatal hernia.  The patient is on Dexilant and the heartburn is under control.  The patient is in need of a screening colonoscopy.  The patient will be set up for a screening colonoscopy and upper endoscopy due to her chronic heartburn.  The patient has been explained the plan and agrees with it.    64, MD. Midge Minium    Note: This  dictation was prepared with Dragon dictation along with smaller phrase technology. Any transcriptional errors that result from this process are unintentional.

## 2019-06-18 ENCOUNTER — Other Ambulatory Visit: Payer: Self-pay

## 2019-06-18 DIAGNOSIS — K219 Gastro-esophageal reflux disease without esophagitis: Secondary | ICD-10-CM

## 2019-06-26 ENCOUNTER — Other Ambulatory Visit: Payer: Self-pay | Admitting: Family Medicine

## 2019-07-07 ENCOUNTER — Other Ambulatory Visit: Payer: Self-pay

## 2019-07-07 ENCOUNTER — Encounter: Payer: Self-pay | Admitting: Gastroenterology

## 2019-07-09 ENCOUNTER — Other Ambulatory Visit: Payer: Self-pay

## 2019-07-09 ENCOUNTER — Other Ambulatory Visit
Admission: RE | Admit: 2019-07-09 | Discharge: 2019-07-09 | Disposition: A | Discharge status: Home / Self Care | Payer: Medicare Other | Source: Ambulatory Visit | Attending: Gastroenterology | Admitting: Gastroenterology

## 2019-07-09 DIAGNOSIS — Z20822 Contact with and (suspected) exposure to covid-19: Secondary | ICD-10-CM | POA: Insufficient documentation

## 2019-07-09 DIAGNOSIS — Z01812 Encounter for preprocedural laboratory examination: Secondary | ICD-10-CM | POA: Insufficient documentation

## 2019-07-09 LAB — SARS CORONAVIRUS 2 (TAT 6-24 HRS): SARS Coronavirus 2: NEGATIVE

## 2019-07-09 NOTE — Discharge Instructions (Signed)
General Anesthesia, Adult, Care After This sheet gives you information about how to care for yourself after your procedure. Your health care provider may also give you more specific instructions. If you have problems or questions, contact your health care provider. What can I expect after the procedure? After the procedure, the following side effects are common:  Pain or discomfort at the IV site.  Nausea.  Vomiting.  Sore throat.  Trouble concentrating.  Feeling cold or chills.  Weak or tired.  Sleepiness and fatigue.  Soreness and body aches. These side effects can affect parts of the body that were not involved in surgery. Follow these instructions at home:  For at least 24 hours after the procedure:  Have a responsible adult stay with you. It is important to have someone help care for you until you are awake and alert.  Rest as needed.  Do not: ? Participate in activities in which you could fall or become injured. ? Drive. ? Use heavy machinery. ? Drink alcohol. ? Take sleeping pills or medicines that cause drowsiness. ? Make important decisions or sign legal documents. ? Take care of children on your own. Eating and drinking  Follow any instructions from your health care provider about eating or drinking restrictions.  When you feel hungry, start by eating small amounts of foods that are soft and easy to digest (bland), such as toast. Gradually return to your regular diet.  Drink enough fluid to keep your urine pale yellow.  If you vomit, rehydrate by drinking water, juice, or clear broth. General instructions  If you have sleep apnea, surgery and certain medicines can increase your risk for breathing problems. Follow instructions from your health care provider about wearing your sleep device: ? Anytime you are sleeping, including during daytime naps. ? While taking prescription pain medicines, sleeping medicines, or medicines that make you drowsy.  Return to  your normal activities as told by your health care provider. Ask your health care provider what activities are safe for you.  Take over-the-counter and prescription medicines only as told by your health care provider.  If you smoke, do not smoke without supervision.  Keep all follow-up visits as told by your health care provider. This is important. Contact a health care provider if:  You have nausea or vomiting that does not get better with medicine.  You cannot eat or drink without vomiting.  You have pain that does not get better with medicine.  You are unable to pass urine.  You develop a skin rash.  You have a fever.  You have redness around your IV site that gets worse. Get help right away if:  You have difficulty breathing.  You have chest pain.  You have blood in your urine or stool, or you vomit blood. Summary  After the procedure, it is common to have a sore throat or nausea. It is also common to feel tired.  Have a responsible adult stay with you for the first 24 hours after general anesthesia. It is important to have someone help care for you until you are awake and alert.  When you feel hungry, start by eating small amounts of foods that are soft and easy to digest (bland), such as toast. Gradually return to your regular diet.  Drink enough fluid to keep your urine pale yellow.  Return to your normal activities as told by your health care provider. Ask your health care provider what activities are safe for you. This information is not   intended to replace advice given to you by your health care provider. Make sure you discuss any questions you have with your health care provider. Document Revised: 04/19/2017 Document Reviewed: 11/30/2016 Elsevier Patient Education  2020 Elsevier Inc.  

## 2019-07-13 ENCOUNTER — Encounter
Admission: RE | Disposition: A | Discharge status: Home / Self Care | Payer: Self-pay | Source: Ambulatory Visit | Attending: Gastroenterology

## 2019-07-13 ENCOUNTER — Ambulatory Visit: Payer: Medicare Other | Admitting: Anesthesiology

## 2019-07-13 ENCOUNTER — Encounter: Payer: Self-pay | Admitting: Gastroenterology

## 2019-07-13 ENCOUNTER — Ambulatory Visit
Admission: RE | Admit: 2019-07-13 | Discharge: 2019-07-13 | Disposition: A | Discharge status: Home / Self Care | Payer: Medicare Other | Source: Ambulatory Visit | Attending: Gastroenterology | Admitting: Gastroenterology

## 2019-07-13 ENCOUNTER — Other Ambulatory Visit: Payer: Self-pay

## 2019-07-13 DIAGNOSIS — I10 Essential (primary) hypertension: Secondary | ICD-10-CM | POA: Insufficient documentation

## 2019-07-13 DIAGNOSIS — Z803 Family history of malignant neoplasm of breast: Secondary | ICD-10-CM | POA: Insufficient documentation

## 2019-07-13 DIAGNOSIS — Z8249 Family history of ischemic heart disease and other diseases of the circulatory system: Secondary | ICD-10-CM | POA: Diagnosis not present

## 2019-07-13 DIAGNOSIS — Z6841 Body Mass Index (BMI) 40.0 and over, adult: Secondary | ICD-10-CM | POA: Diagnosis not present

## 2019-07-13 DIAGNOSIS — Z79899 Other long term (current) drug therapy: Secondary | ICD-10-CM | POA: Diagnosis not present

## 2019-07-13 DIAGNOSIS — J45909 Unspecified asthma, uncomplicated: Secondary | ICD-10-CM | POA: Diagnosis not present

## 2019-07-13 DIAGNOSIS — Z1211 Encounter for screening for malignant neoplasm of colon: Secondary | ICD-10-CM | POA: Diagnosis not present

## 2019-07-13 DIAGNOSIS — Z7951 Long term (current) use of inhaled steroids: Secondary | ICD-10-CM | POA: Insufficient documentation

## 2019-07-13 DIAGNOSIS — Z90710 Acquired absence of both cervix and uterus: Secondary | ICD-10-CM | POA: Insufficient documentation

## 2019-07-13 DIAGNOSIS — Z96652 Presence of left artificial knee joint: Secondary | ICD-10-CM | POA: Diagnosis not present

## 2019-07-13 DIAGNOSIS — F419 Anxiety disorder, unspecified: Secondary | ICD-10-CM | POA: Diagnosis not present

## 2019-07-13 DIAGNOSIS — K640 First degree hemorrhoids: Secondary | ICD-10-CM | POA: Insufficient documentation

## 2019-07-13 DIAGNOSIS — K219 Gastro-esophageal reflux disease without esophagitis: Secondary | ICD-10-CM | POA: Insufficient documentation

## 2019-07-13 DIAGNOSIS — K449 Diaphragmatic hernia without obstruction or gangrene: Secondary | ICD-10-CM | POA: Insufficient documentation

## 2019-07-13 DIAGNOSIS — Z809 Family history of malignant neoplasm, unspecified: Secondary | ICD-10-CM | POA: Insufficient documentation

## 2019-07-13 DIAGNOSIS — F329 Major depressive disorder, single episode, unspecified: Secondary | ICD-10-CM | POA: Insufficient documentation

## 2019-07-13 HISTORY — PX: ESOPHAGOGASTRODUODENOSCOPY (EGD) WITH PROPOFOL: SHX5813

## 2019-07-13 HISTORY — PX: COLONOSCOPY WITH PROPOFOL: SHX5780

## 2019-07-13 HISTORY — DX: Unspecified asthma, uncomplicated: J45.909

## 2019-07-13 HISTORY — DX: Other complications of anesthesia, initial encounter: T88.59XA

## 2019-07-13 HISTORY — DX: Unspecified glaucoma: H40.9

## 2019-07-13 HISTORY — DX: Unspecified osteoarthritis, unspecified site: M19.90

## 2019-07-13 HISTORY — DX: Personal history of (healed) traumatic fracture: Z87.81

## 2019-07-13 HISTORY — DX: Headache, unspecified: R51.9

## 2019-07-13 SURGERY — COLONOSCOPY WITH PROPOFOL
Anesthesia: General | Site: Rectum

## 2019-07-13 MED ORDER — LACTATED RINGERS IV SOLN: INTRAVENOUS | Status: DC

## 2019-07-13 MED ORDER — STERILE WATER FOR IRRIGATION IR SOLN
Status: DC | PRN
  Administered 2019-07-13: 50 mL

## 2019-07-13 MED ORDER — PROPOFOL 10 MG/ML IV BOLUS
INTRAVENOUS | Status: DC | PRN
  Administered 2019-07-13 (×8): 30 mg via INTRAVENOUS
  Administered 2019-07-13: 100 mg via INTRAVENOUS
  Administered 2019-07-13: 50 mg via INTRAVENOUS

## 2019-07-13 MED ORDER — ONDANSETRON HCL 4 MG/2ML IJ SOLN: 4.0000 mg | Freq: Once | INTRAMUSCULAR | Status: DC | PRN

## 2019-07-13 MED ORDER — GLYCOPYRROLATE 0.2 MG/ML IJ SOLN
INTRAMUSCULAR | Status: DC | PRN
  Administered 2019-07-13: .1 mg via INTRAVENOUS

## 2019-07-13 MED ORDER — LIDOCAINE HCL (CARDIAC) PF 100 MG/5ML IV SOSY
PREFILLED_SYRINGE | INTRAVENOUS | Status: DC | PRN
  Administered 2019-07-13: 30 mg via INTRAVENOUS

## 2019-07-13 SURGICAL SUPPLY — 6 items
BLOCK BITE 60FR ADLT L/F GRN (MISCELLANEOUS) ×4 IMPLANT
CANISTER SUCT 1200ML W/VALVE (MISCELLANEOUS) ×4 IMPLANT
GOWN CVR UNV OPN BCK APRN NK (MISCELLANEOUS) ×4 IMPLANT
GOWN ISOL THUMB LOOP REG UNIV (MISCELLANEOUS) ×4
KIT ENDO PROCEDURE OLY (KITS) ×4 IMPLANT
WATER STERILE IRR 250ML POUR (IV SOLUTION) ×4 IMPLANT

## 2019-07-13 NOTE — Op Note (Signed)
[facility] Gastroenterology Patient Name: Diane Small Procedure Date: 07/13/2019 11:37 AM MRN: [id number] Account #: [id number] Date of Birth: 10/07/1955 Admit Type: Outpatient Age: 64 Room: [facility] OR ROOM 01 Gender: Female Note Status: Finalized Procedure:             Colonoscopy Indications:           Screening for colorectal malignant neoplasm Providers:             Lucilla Lame MD, MD Medicines:             Propofol per Anesthesia Complications:         No immediate complications. Procedure:             Pre-Anesthesia Assessment:                        - Prior to the procedure, a History and Physical was                         performed, and patient medications and allergies were                         reviewed. The patient's tolerance of previous                         anesthesia was also reviewed. The risks and benefits                         of the procedure and the sedation options and risks                         were discussed with the patient. All questions were                         answered, and informed consent was obtained. Prior                         Anticoagulants: The patient has taken no previous                         anticoagulant or antiplatelet agents. ASA Grade                         Assessment: II - A patient with mild systemic disease.                         After reviewing the risks and benefits, the patient                         was deemed in satisfactory condition to undergo the                         procedure.                        After obtaining informed consent, the colonoscope was                         passed under direct vision. Throughout the procedure,  the patient's blood pressure, pulse, and oxygen                         saturations were monitored continuously. The                         Colonoscope was introduced through the anus and                         advanced to the the  cecum, identified by appendiceal                         orifice and ileocecal valve. The colonoscopy was                         performed without difficulty. The patient tolerated                         the procedure well. The quality of the bowel                         preparation was excellent. Findings:      The perianal and digital rectal examinations were normal.      Non-bleeding internal hemorrhoids were found during retroflexion. The       hemorrhoids were Grade I (internal hemorrhoids that do not prolapse). Impression:            - Non-bleeding internal hemorrhoids.                        - No specimens collected. Recommendation:        - Discharge patient to home.                        - Resume previous diet.                        - Continue present medications.                        - Repeat colonoscopy in 10 years for screening unless                         any change in family history or lower GI problems. Procedure Code(s):     --- Professional ---                        [phone], Colonoscopy, flexible; diagnostic, including                         collection of specimen(s) by brushing or washing, when                         performed (separate procedure) Diagnosis Code(s):     --- Professional ---                        Z12.11, Encounter for screening for malignant neoplasm                         of colon CPT  copyright 2019 American Medical Association. All rights reserved. The codes documented in this report are preliminary and upon coder review may  be revised to meet current compliance requirements. Midge Minium MD, MD 07/13/2019 12:03:17 PM This report has been signed electronically. Number of Addenda: 0 Note Initiated On: 07/13/2019 11:37 AM Scope Withdrawal Time: 0 hours 6 minutes 37 seconds  Total Procedure Duration: 0 hours 8 minutes 6 seconds  Estimated Blood Loss:  Estimated blood loss: none.      [facility]

## 2019-07-13 NOTE — Anesthesia Postprocedure Evaluation (Signed)
Anesthesia Post Note  Patient: Copy  Procedure(s) Performed: COLONOSCOPY WITH PROPOFOL (N/A Rectum) ESOPHAGOGASTRODUODENOSCOPY (EGD) WITH PROPOFOL (N/A Mouth)     Patient location during evaluation: PACU Anesthesia Type: General Level of consciousness: awake and alert Pain management: pain level controlled Vital Signs Assessment: post-procedure vital signs reviewed and stable Respiratory status: spontaneous breathing, nonlabored ventilation, respiratory function stable and patient connected to nasal cannula oxygen Cardiovascular status: blood pressure returned to baseline and stable Postop Assessment: no apparent nausea or vomiting Anesthetic complications: no    Diane Small

## 2019-07-13 NOTE — Transfer of Care (Signed)
Immediate Anesthesia Transfer of Care Note  Patient: Diane Small  Procedure(s) Performed: COLONOSCOPY WITH PROPOFOL (N/A Rectum) ESOPHAGOGASTRODUODENOSCOPY (EGD) WITH PROPOFOL (N/A Mouth)  Patient Location: PACU  Anesthesia Type: General  Level of Consciousness: awake, alert  and patient cooperative  Airway and Oxygen Therapy: Patient Spontanous Breathing and Patient connected to supplemental oxygen  Post-op Assessment: Post-op Vital signs reviewed, Patient's Cardiovascular Status Stable, Respiratory Function Stable, Patent Airway and No signs of Nausea or vomiting  Post-op Vital Signs: Reviewed and stable  Complications: No apparent anesthesia complications

## 2019-07-13 NOTE — Anesthesia Preprocedure Evaluation (Addendum)
Anesthesia Evaluation  Patient identified by MRN, date of birth, ID band Patient awake    History of Anesthesia Complications Negative for: history of anesthetic complications  Airway Mallampati: II  TM Distance: >3 FB Neck ROM: Full    Dental no notable dental hx.    Pulmonary asthma ,    Pulmonary exam normal        Cardiovascular hypertension, Pt. on medications Normal cardiovascular exam     Neuro/Psych negative neurological ROS     GI/Hepatic GERD  Medicated,  Endo/Other  Morbid obesity  Renal/GU      Musculoskeletal   Abdominal   Peds  Hematology negative hematology ROS (+)   Anesthesia Other Findings   Reproductive/Obstetrics                           Anesthesia Physical Anesthesia Plan  ASA: III  Anesthesia Plan: General   Post-op Pain Management:    Induction: Intravenous  PONV Risk Score and Plan: 3 and TIVA, Propofol infusion and Treatment may vary due to age or medical condition  Airway Management Planned: Nasal Cannula and Natural Airway  Additional Equipment: None  Intra-op Plan:   Post-operative Plan: Extubation in OR  Informed Consent: I have reviewed the patients History and Physical, chart, labs and discussed the procedure including the risks, benefits and alternatives for the proposed anesthesia with the patient or authorized representative who has indicated his/her understanding and acceptance.       Plan Discussed with: CRNA  Anesthesia Plan Comments:         Anesthesia Quick Evaluation

## 2019-07-13 NOTE — Interval H&P Note (Signed)
History and Physical Interval Note:  07/13/2019 11:08 AM  Diane Small  has presented today for surgery, with the diagnosis of Screening Z12.11 GERD K21.9.  The various methods of treatment have been discussed with the patient and family. After consideration of risks, benefits and other options for treatment, the patient has consented to  Procedure(s) with comments: COLONOSCOPY WITH PROPOFOL (N/A) ESOPHAGOGASTRODUODENOSCOPY (EGD) WITH PROPOFOL (N/A) - PRIORITY 3 as a surgical intervention.  The patient's history has been reviewed, patient examined, no change in status, stable for surgery.  I have reviewed the patient's chart and labs.  Questions were answered to the patient's satisfaction.     Laveda Demedeiros [employer]

## 2019-07-13 NOTE — Anesthesia Procedure Notes (Signed)
Date/Time: 07/13/2019 11:42 AM Performed by: Maree Krabbe, CRNA Pre-anesthesia Checklist: Patient identified, Emergency Drugs available, Suction available, Timeout performed and Patient being monitored Patient Re-evaluated:Patient Re-evaluated prior to induction Oxygen Delivery Method: Nasal cannula Placement Confirmation: positive ETCO2

## 2019-07-13 NOTE — Op Note (Signed)
[facility] Gastroenterology Patient Name: Diane Small Procedure Date: 07/13/2019 11:37 AM MRN: [id number] Account #: [id number] Date of Birth: June 07, 1955 Admit Type: Outpatient Age: 64 Room: [facility] OR ROOM 01 Gender: Female Note Status: Finalized Procedure:             Upper GI endoscopy Indications:           Heartburn Providers:             Lucilla Lame MD, MD Referring MD:          Juline Patch, MD (Referring MD) Medicines:             Propofol per Anesthesia Complications:         No immediate complications. Procedure:             Pre-Anesthesia Assessment:                        - Prior to the procedure, a History and Physical was                         performed, and patient medications and allergies were                         reviewed. The patient's tolerance of previous                         anesthesia was also reviewed. The risks and benefits                         of the procedure and the sedation options and risks                         were discussed with the patient. All questions were                         answered, and informed consent was obtained. Prior                         Anticoagulants: The patient has taken no previous                         anticoagulant or antiplatelet agents. ASA Grade                         Assessment: II - A patient with mild systemic disease.                         After reviewing the risks and benefits, the patient                         was deemed in satisfactory condition to undergo the                         procedure.                        After obtaining informed consent, the endoscope was  passed under direct vision. Throughout the procedure,                         the patient's blood pressure, pulse, and oxygen                         saturations were monitored continuously. The was                         introduced through the mouth, and advanced to the                 second part of duodenum. The upper GI endoscopy was                         accomplished without difficulty. The patient tolerated                         the procedure well. Findings:      A small hiatal hernia was present.      The stomach was normal.      The examined duodenum was normal. Impression:            - Small hiatal hernia.                        - Normal stomach.                        - Normal examined duodenum.                        - No specimens collected. Recommendation:        - Discharge patient to home.                        - Resume previous diet.                        - Continue present medications. Procedure Code(s):     --- Professional ---                        [phone], Esophagogastroduodenoscopy, flexible,                         transoral; diagnostic, including collection of                         specimen(s) by brushing or washing, when performed                         (separate procedure) Diagnosis Code(s):     --- Professional ---                        R12, Heartburn CPT copyright 2019 American Medical Association. All rights reserved. The codes documented in this report are preliminary and upon coder review may  be revised to meet current compliance requirements. Midge Minium MD, MD 07/13/2019 11:51:08 AM This report has been signed electronically. Number of Addenda: 0 Note Initiated On: 07/13/2019 11:37 AM Total Procedure Duration: 0 hours 2 minutes 17 seconds  Estimated Blood Loss:  Estimated blood  loss: none.      [facility]

## 2019-07-14 ENCOUNTER — Encounter: Payer: Self-pay | Admitting: *Deleted

## 2019-10-20 ENCOUNTER — Other Ambulatory Visit: Payer: Self-pay

## 2019-10-20 ENCOUNTER — Ambulatory Visit: Payer: Medicare Other | Admitting: Family Medicine

## 2019-10-20 ENCOUNTER — Ambulatory Visit (INDEPENDENT_AMBULATORY_CARE_PROVIDER_SITE_OTHER): Payer: Medicare Other | Admitting: Family Medicine

## 2019-10-20 ENCOUNTER — Encounter: Payer: Self-pay | Admitting: Family Medicine

## 2019-10-20 VITALS — BP 102/52 | HR 76 | Ht 63.0 in | Wt 275.0 lb

## 2019-10-20 DIAGNOSIS — I10 Essential (primary) hypertension: Secondary | ICD-10-CM

## 2019-10-20 DIAGNOSIS — J4520 Mild intermittent asthma, uncomplicated: Secondary | ICD-10-CM | POA: Diagnosis not present

## 2019-10-20 MED ORDER — ALBUTEROL SULFATE HFA 108 (90 BASE) MCG/ACT IN AERS: INHALATION_SPRAY | RESPIRATORY_TRACT | 11 refills | Status: DC

## 2019-10-20 MED ORDER — TELMISARTAN 40 MG PO TABS: 40.0000 mg | ORAL_TABLET | Freq: Every day | ORAL | 1 refills | Status: DC

## 2019-10-20 MED ORDER — HYDROCHLOROTHIAZIDE 25 MG PO TABS: ORAL_TABLET | ORAL | 1 refills | Status: DC

## 2019-10-20 MED ORDER — MONTELUKAST SODIUM 10 MG PO TABS: 10.0000 mg | ORAL_TABLET | Freq: Every day | ORAL | 11 refills | Status: DC

## 2019-10-20 NOTE — Progress Notes (Signed)
Date:  10/20/2019   Name:  Diane Small   DOB:  1955/08/12   MRN:  [id number]   Chief Complaint: Hypertension (med RF) and Asthma  Hypertension This is a chronic problem. The current episode started more than 1 year ago. The problem has been gradually improving since onset. The problem is controlled. Pertinent negatives include no anxiety, blurred vision, chest pain, headaches, malaise/fatigue, neck pain, orthopnea, palpitations, peripheral edema, PND, shortness of breath or sweats. There are no associated agents to hypertension. Risk factors for coronary artery disease include obesity. Past treatments include angiotensin blockers and diuretics. The current treatment provides moderate improvement. There are no compliance problems.  There is no history of angina, kidney disease, CAD/MI, CVA, heart failure, left ventricular hypertrophy, PVD or retinopathy. There is no history of chronic renal disease, a hypertension causing med or renovascular disease.  Asthma She complains of wheezing. There is no chest tightness, cough, difficulty breathing, frequent throat clearing, hemoptysis, shortness of breath or sputum production. Primary symptoms comments: With high humidity. This is a chronic problem. The current episode started more than 1 year ago. Pertinent negatives include no chest pain, ear pain, fever, headaches, malaise/fatigue, myalgias, PND, rhinorrhea, sneezing, sore throat or sweats. Her past medical history is significant for asthma.    Lab Results  Component Value Date   CREATININE 0.93 04/21/2019   BUN 8 04/21/2019   NA 141 04/21/2019   K 4.5 04/21/2019   CL 99 04/21/2019   CO2 26 04/21/2019   Lab Results  Component Value Date   CHOL 191 04/21/2019   HDL 45 04/21/2019   LDLCALC 115 (H) 04/21/2019   TRIG 176 (H) 04/21/2019   CHOLHDL 3.2 10/15/2017   Lab Results  Component Value Date   TSH 1.210 10/15/2017   No results found for: HGBA1C Lab Results  Component  Value Date   WBC 10.9 07/29/2014   HGB 9.9 (L) 07/29/2014   HCT 29.2 (L) 07/29/2014   MCV 84 07/29/2014   PLT 267 07/29/2014   Lab Results  Component Value Date   ALT 22 07/30/2014   AST 45 (H) 07/30/2014   ALKPHOS 49 07/30/2014   BILITOT 0.6 07/30/2014     Review of Systems  Constitutional: Negative.  Negative for chills, fatigue, fever, malaise/fatigue and unexpected weight change.  HENT: Negative for congestion, ear discharge, ear pain, rhinorrhea, sinus pressure, sneezing and sore throat.   Eyes: Negative for blurred vision, photophobia, pain, discharge, redness and itching.  Respiratory: Positive for wheezing. Negative for cough, hemoptysis, sputum production, shortness of breath and stridor.   Cardiovascular: Negative for chest pain, palpitations, orthopnea and PND.  Gastrointestinal: Negative for abdominal pain, blood in stool, constipation, diarrhea, nausea and vomiting.  Endocrine: Negative for cold intolerance, heat intolerance, polydipsia, polyphagia and polyuria.  Genitourinary: Negative for dysuria, flank pain, frequency, hematuria, menstrual problem, pelvic pain, urgency, vaginal bleeding and vaginal discharge.  Musculoskeletal: Negative for arthralgias, back pain, myalgias and neck pain.  Skin: Negative for rash.  Allergic/Immunologic: Negative for environmental allergies and food allergies.  Neurological: Negative for dizziness, weakness, light-headedness, numbness and headaches.  Hematological: Negative for adenopathy. Does not bruise/bleed easily.  Psychiatric/Behavioral: Negative for dysphoric mood. The patient is not nervous/anxious.     Patient Active Problem List   Diagnosis Date Noted   Pure hypercholesterolemia 04/16/2017   Hyperlipidemia 04/18/2016   DDD (degenerative disc disease), lumbosacral 10/17/2015   Esophageal reflux 04/18/2015   Essential hypertension 04/18/2015   Annual physical exam 04/18/2015  Acid reflux 09/01/2014   Routine  general medical examination at a health care facility 09/01/2014   Glaucoma 09/01/2014   BP (high blood pressure) 09/01/2014   Extreme obesity 09/01/2014   Arthritis of knee, degenerative 09/09/2013    Allergies  Allergen Reactions   Amoxicillin-Pot Clavulanate Diarrhea    Past Surgical History:  Procedure Laterality Date   CESAREAN SECTION     COLONOSCOPY WITH PROPOFOL N/A 07/13/2019   Procedure: COLONOSCOPY WITH PROPOFOL;  Surgeon: Midge Minium, MD;  Location: [facility];  Service: Endoscopy;  Laterality: N/A;   ESOPHAGOGASTRODUODENOSCOPY (EGD) WITH PROPOFOL N/A 07/13/2019   Procedure: ESOPHAGOGASTRODUODENOSCOPY (EGD) WITH PROPOFOL;  Surgeon: Midge Minium, MD;  Location: [facility];  Service: Endoscopy;  Laterality: N/A;  PRIORITY 3   OOPHORECTOMY     TOTAL KNEE ARTHROPLASTY Left 02/29/2015   VAGINAL HYSTERECTOMY      Social History   Tobacco Use   Smoking status: Never Smoker   Smokeless tobacco: Never Used  Vaping Use   Vaping Use: Never used  Substance Use Topics   Alcohol use: No    Alcohol/week: 0.0 standard drinks   Drug use: No     Medication list has been reviewed and updated.  Current Meds  Medication Sig   albuterol (VENTOLIN HFA) 108 (90 Base) MCG/ACT inhaler INHALE 1-2 PUFFS INTO THE LUNGS EVERY 6 (SIX) HOURS AS NEEDED FOR WHEEZING OR SHORTNESS OF BREATH.   ALPRAZolam (XANAX) 1 MG tablet Take 1 tablet by mouth at bedtime. psych   ASPIRIN 81 PO Take by mouth daily.   azelastine (ASTELIN) 0.1 % nasal spray Dr Andee Poles   budesonide (PULMICORT) 0.5 MG/2ML nebulizer solution Dr Andee Poles   budesonide-formoterol [facility]) 160-4.5 MCG/ACT inhaler Inhale into the lungs. Dr Andee Poles   dexlansoprazole (DEXILANT) 60 MG capsule Take by mouth. Dr Andee Poles   escitalopram (LEXAPRO) 5 MG tablet Take 1 tablet by mouth daily. psych   hydrochlorothiazide (HYDRODIURIL) 25 MG tablet TAKE 1 TABLET BY MOUTH EVERY DAY   latanoprost (XALATAN)  0.005 % ophthalmic solution Dr Inez Pilgrim   montelukast (SINGULAIR) 10 MG tablet Take 1 tablet by mouth daily.   Omega-3 Fatty Acids (FISH OIL PO) Take by mouth.   telmisartan (MICARDIS) 40 MG tablet Take 1 tablet (40 mg total) by mouth daily.   timolol (TIMOPTIC) 0.5 % ophthalmic solution Dr Inez Pilgrim    [facility] 2/9 Scores 10/20/2019 10/17/2018 04/17/2018 10/15/2017  PHQ - 2 Score 0 0 0 0  PHQ- 9 Score 0 0 0 2    GAD 7 : Generalized Anxiety Score 10/20/2019  Nervous, Anxious, on Edge 0  Control/stop worrying 0  Worry too much - different things 0  Trouble relaxing 0  Restless 0  Easily annoyed or irritable 0  Afraid - awful might happen 0  Total GAD 7 Score 0  Anxiety Difficulty Not difficult at all    BP Readings from Last 3 Encounters:  10/20/19 (!) 102/52  07/13/19 136/90  06/17/19 107/70    Physical Exam Vitals and nursing note reviewed.  Constitutional:      Appearance: She is well-developed.  HENT:     Head: Normocephalic.     Right Ear: Tympanic membrane and external ear normal.     Left Ear: Tympanic membrane and external ear normal.  Eyes:     General: Lids are everted, no foreign bodies appreciated. No scleral icterus.       Left eye: No foreign body or hordeolum.     Conjunctiva/sclera: Conjunctivae normal.  Right eye: Right conjunctiva is not injected.     Left eye: Left conjunctiva is not injected.     Pupils: Pupils are equal, round, and reactive to light.  Neck:     Thyroid: No thyromegaly.     Vascular: No JVD.     Trachea: No tracheal deviation.  Cardiovascular:     Rate and Rhythm: Normal rate and regular rhythm.     Heart sounds: Normal heart sounds, S1 normal and S2 normal. No murmur heard.  No systolic murmur is present.  No diastolic murmur is present.  No friction rub. No gallop. No S3 or S4 sounds.   Pulmonary:     Effort: Pulmonary effort is normal. No respiratory distress.     Breath sounds: Normal breath sounds. No decreased breath  sounds, wheezing, rhonchi or rales.  Abdominal:     General: Bowel sounds are normal.     Palpations: Abdomen is soft. There is no mass.     Tenderness: There is no abdominal tenderness. There is no guarding or rebound.  Musculoskeletal:        General: No tenderness. Normal range of motion.     Cervical back: Normal range of motion and neck supple.     Right lower leg: No edema.     Left lower leg: No edema.  Lymphadenopathy:     Cervical: No cervical adenopathy.  Skin:    General: Skin is warm.     Findings: No rash.  Neurological:     Mental Status: She is alert and oriented to person, place, and time.     Cranial Nerves: No cranial nerve deficit.     Deep Tendon Reflexes: Reflexes normal.  Psychiatric:        Mood and Affect: Mood is not anxious or depressed.     Wt Readings from Last 3 Encounters:  10/20/19 275 lb (124.7 kg)  07/13/19 274 lb (124.3 kg)  06/17/19 276 lb 12.8 oz (125.6 kg)    BP (!) 102/52    Pulse 76    Ht 5\' 3"  (1.6 m)    Wt 275 lb (124.7 kg)    BMI 48.71 kg/m   Assessment and Plan: 1. Essential hypertension Chronic.  Controlled.  Stable.  Continue hydrochlorothiazide 25 mg and telmisartan 40 mg once a day.  Review of renal function panel last December notes normal electrolytes and GFR.  We will recheck patient in 6 months. - hydrochlorothiazide (HYDRODIURIL) 25 MG tablet; TAKE 1 TABLET BY MOUTH EVERY DAY  Dispense: 90 tablet; Refill: 1 - telmisartan (MICARDIS) 40 MG tablet; Take 1 tablet (40 mg total) by mouth daily.  Dispense: 90 tablet; Refill: 1  2. Mild intermittent asthma, unspecified whether complicated Chronic.  Controlled.  Stable.  Intermittent with mild exacerbations due to high humidity.  Refill albuterol inhaler 1 to 2 puffs every 6 hours as needed for wheezing as well as continuance of Singulair 10 mg once a day. - albuterol (VENTOLIN HFA) 108 (90 Base) MCG/ACT inhaler; INHALE 1-2 PUFFS INTO THE LUNGS EVERY 6 (SIX) HOURS AS NEEDED FOR  WHEEZING OR SHORTNESS OF BREATH.  Dispense: 8 g; Refill: 11 - montelukast (SINGULAIR) 10 MG tablet; Take 1 tablet (10 mg total) by mouth daily.  Dispense: 30 tablet; Refill: 11

## 2019-11-06 ENCOUNTER — Telehealth: Payer: Self-pay | Admitting: Family Medicine

## 2019-11-06 NOTE — Telephone Encounter (Signed)
Left message for patient to call back and schedule Medicare Annual Wellness Visit (AWV) either virtually/audio only or in office. Whichever the patients preference is.  No history of AWV; please schedule at anytime with [facility]-Nurse Health Advisor.  This should be a 40 minute visit OK TO BILL [id number] 

## 2019-11-23 ENCOUNTER — Ambulatory Visit (INDEPENDENT_AMBULATORY_CARE_PROVIDER_SITE_OTHER): Payer: Medicare Other

## 2019-11-23 DIAGNOSIS — Z0000 Encounter for general adult medical examination without abnormal findings: Secondary | ICD-10-CM | POA: Diagnosis not present

## 2019-11-23 DIAGNOSIS — H401131 Primary open-angle glaucoma, bilateral, mild stage: Secondary | ICD-10-CM | POA: Diagnosis not present

## 2019-11-23 NOTE — Patient Instructions (Signed)
Diane Small , Thank you for taking time to come for your Medicare Wellness Visit. I appreciate your ongoing commitment to your health goals. Please review the following plan we discussed and let me know if I can assist you in the future.   Screening recommendations/referrals: Colonoscopy: done 07/13/19 Mammogram: done 05/19/19 Bone Density: due at age 64 Recommended yearly ophthalmology/optometry visit for glaucoma screening and checkup Recommended yearly dental visit for hygiene and checkup  Vaccinations: Influenza vaccine: done 01/14/19 Pneumococcal vaccine: done 02/09/15 Tdap vaccine: done 04/17/18 Shingles vaccine: done 01/14/19 & 03/13/19  Covid-19: done 05/11/19 & 06/01/19  Advanced directives: Please bring a copy of your health care power of attorney and living will to the office at your convenience.  Conditions/risks identified: Recommend healthy eating and physical activity for desired weight loss  Next appointment: Follow up in one year for your annual wellness visit.   Preventive Care 40-64 Years, Female Preventive care refers to lifestyle choices and visits with your health care provider that can promote health and wellness. What does preventive care include?  A yearly physical exam. This is also called an annual well check.  Dental exams once or twice a year.  Routine eye exams. Ask your health care provider how often you should have your eyes checked.  Personal lifestyle choices, including:  Daily care of your teeth and gums.  Regular physical activity.  Eating a healthy diet.  Avoiding tobacco and drug use.  Limiting alcohol use.  Practicing safe sex.  Taking low-dose aspirin daily starting at age 73.  Taking vitamin and mineral supplements as recommended by your health care provider. What happens during an annual well check? The services and screenings done by your health care provider during your annual well check will depend on your age, overall health,  lifestyle risk factors, and family history of disease. Counseling  Your health care provider may ask you questions about your:  Alcohol use.  Tobacco use.  Drug use.  Emotional well-being.  Home and relationship well-being.  Sexual activity.  Eating habits.  Work and work Statistician.  Method of birth control.  Menstrual cycle.  Pregnancy history. Screening  You may have the following tests or measurements:  Height, weight, and BMI.  Blood pressure.  Lipid and cholesterol levels. These may be checked every 5 years, or more frequently if you are over 78 years old.  Skin check.  Lung cancer screening. You may have this screening every year starting at age 79 if you have a 30-pack-year history of smoking and currently smoke or have quit within the past 15 years.  Fecal occult blood test (FOBT) of the stool. You may have this test every year starting at age 26.  Flexible sigmoidoscopy or colonoscopy. You may have a sigmoidoscopy every 5 years or a colonoscopy every 10 years starting at age 83.  Hepatitis C blood test.  Hepatitis B blood test.  Sexually transmitted disease (STD) testing.  Diabetes screening. This is done by checking your blood sugar (glucose) after you have not eaten for a while (fasting). You may have this done every 1-3 years.  Mammogram. This may be done every 1-2 years. Talk to your health care provider about when you should start having regular mammograms. This may depend on whether you have a family history of breast cancer.  BRCA-related cancer screening. This may be done if you have a family history of breast, ovarian, tubal, or peritoneal cancers.  Pelvic exam and Pap test. This may be done every  3 years starting at age 21. Starting at age 30, this may be done every 5 years if you have a Pap test in combination with an HPV test.  Bone density scan. This is done to screen for osteoporosis. You may have this scan if you are at high risk for  osteoporosis. Discuss your test results, treatment options, and if necessary, the need for more tests with your health care provider. Vaccines  Your health care provider may recommend certain vaccines, such as:  Influenza vaccine. This is recommended every year.  Tetanus, diphtheria, and acellular pertussis (Tdap, Td) vaccine. You may need a Td booster every 10 years.  Zoster vaccine. You may need this after age 60.  Pneumococcal 13-valent conjugate (PCV13) vaccine. You may need this if you have certain conditions and were not previously vaccinated.  Pneumococcal polysaccharide (PPSV23) vaccine. You may need one or two doses if you smoke cigarettes or if you have certain conditions. Talk to your health care provider about which screenings and vaccines you need and how often you need them. This information is not intended to replace advice given to you by your health care provider. Make sure you discuss any questions you have with your health care provider. Document Released: 05/13/2015 Document Revised: 01/04/2016 Document Reviewed: 02/15/2015 Elsevier Interactive Patient Education  2017 Elsevier Inc.    Fall Prevention in the Home Falls can cause injuries. They can happen to people of all ages. There are many things you can do to make your home safe and to help prevent falls. What can I do on the outside of my home?  Regularly fix the edges of walkways and driveways and fix any cracks.  Remove anything that might make you trip as you walk through a door, such as a raised step or threshold.  Trim any bushes or trees on the path to your home.  Use bright outdoor lighting.  Clear any walking paths of anything that might make someone trip, such as rocks or tools.  Regularly check to see if handrails are loose or broken. Make sure that both sides of any steps have handrails.  Any raised decks and porches should have guardrails on the edges.  Have any leaves, snow, or ice cleared  regularly.  Use sand or salt on walking paths during winter.  Clean up any spills in your garage right away. This includes oil or grease spills. What can I do in the bathroom?  Use night lights.  Install grab bars by the toilet and in the tub and shower. Do not use towel bars as grab bars.  Use non-skid mats or decals in the tub or shower.  If you need to sit down in the shower, use a plastic, non-slip stool.  Keep the floor dry. Clean up any water that spills on the floor as soon as it happens.  Remove soap buildup in the tub or shower regularly.  Attach bath mats securely with double-sided non-slip rug tape.  Do not have throw rugs and other things on the floor that can make you trip. What can I do in the bedroom?  Use night lights.  Make sure that you have a light by your bed that is easy to reach.  Do not use any sheets or blankets that are too big for your bed. They should not hang down onto the floor.  Have a firm chair that has side arms. You can use this for support while you get dressed.  Do not have throw   rugs and other things on the floor that can make you trip. What can I do in the kitchen?  Clean up any spills right away.  Avoid walking on wet floors.  Keep items that you use a lot in easy-to-reach places.  If you need to reach something above you, use a strong step stool that has a grab bar.  Keep electrical cords out of the way.  Do not use floor polish or wax that makes floors slippery. If you must use wax, use non-skid floor wax.  Do not have throw rugs and other things on the floor that can make you trip. What can I do with my stairs?  Do not leave any items on the stairs.  Make sure that there are handrails on both sides of the stairs and use them. Fix handrails that are broken or loose. Make sure that handrails are as long as the stairways.  Check any carpeting to make sure that it is firmly attached to the stairs. Fix any carpet that is loose  or worn.  Avoid having throw rugs at the top or bottom of the stairs. If you do have throw rugs, attach them to the floor with carpet tape.  Make sure that you have a light switch at the top of the stairs and the bottom of the stairs. If you do not have them, ask someone to add them for you. What else can I do to help prevent falls?  Wear shoes that:  Do not have high heels.  Have rubber bottoms.  Are comfortable and fit you well.  Are closed at the toe. Do not wear sandals.  If you use a stepladder:  Make sure that it is fully opened. Do not climb a closed stepladder.  Make sure that both sides of the stepladder are locked into place.  Ask someone to hold it for you, if possible.  Clearly mark and make sure that you can see:  Any grab bars or handrails.  First and last steps.  Where the edge of each step is.  Use tools that help you move around (mobility aids) if they are needed. These include:  Canes.  Walkers.  Scooters.  Crutches.  Turn on the lights when you go into a dark area. Replace any light bulbs as soon as they burn out.  Set up your furniture so you have a clear path. Avoid moving your furniture around.  If any of your floors are uneven, fix them.  If there are any pets around you, be aware of where they are.  Review your medicines with your doctor. Some medicines can make you feel dizzy. This can increase your chance of falling. Ask your doctor what other things that you can do to help prevent falls. This information is not intended to replace advice given to you by your health care provider. Make sure you discuss any questions you have with your health care provider. Document Released: 02/10/2009 Document Revised: 09/22/2015 Document Reviewed: 05/21/2014 Elsevier Interactive Patient Education  2017 Elsevier Inc.  

## 2019-11-23 NOTE — Progress Notes (Signed)
Subjective:   Diane Small is a 64 y.o. female who presents for an Initial Medicare Annual Wellness Visit.  Virtual Visit via Telephone Note  I connected with  Diane Small on 11/23/19 at 10:40 AM EDT by telephone and verified that I am speaking with the correct person using two identifiers.  Medicare Annual Wellness visit completed telephonically due to Covid-19 pandemic.   Location: Patient: home Provider: office   I discussed the limitations, risks, security and privacy concerns of performing an evaluation and management service by telephone and the availability of in person appointments. The patient expressed understanding and agreed to proceed.  Unable to perform video visit due to video visit attempted and failed and/or patient does not have video capability.   Some vital signs may be absent or patient reported.   Reather Littler, LPN    Review of Systems     Cardiac Risk Factors include: advanced age (>63men, >38 women);hypertension;dyslipidemia;obesity (BMI >30kg/m2)     Objective:    There were no vitals filed for this visit. There is no height or weight on file to calculate BMI.  Advanced Directives 11/23/2019 07/13/2019 03/01/2019  Does Patient Have a Medical Advance Directive? Yes Yes Yes  Type of Estate agent of [place];Living will Healthcare Power of [place];Living will Healthcare Power of [place];Living will  Does patient want to make changes to medical advance directive? - No - Patient declined -  Copy of Healthcare Power of Attorney in Chart? No - copy requested No - copy requested -    Current Medications (verified) Outpatient Encounter Medications as of 11/23/2019  Medication Sig  . albuterol (VENTOLIN HFA) 108 (90 Base) MCG/ACT inhaler INHALE 1-2 PUFFS INTO THE LUNGS EVERY 6 (SIX) HOURS AS NEEDED FOR WHEEZING OR SHORTNESS OF BREATH.  Marland Kitchen ALPRAZolam (XANAX) 1 MG tablet Take 1 tablet by mouth at bedtime. psych  .  ASPIRIN 81 PO Take by mouth daily.  Marland Kitchen azelastine (ASTELIN) 0.1 % nasal spray Dr Andee Poles  . dexlansoprazole (DEXILANT) 60 MG capsule Take by mouth. Dr Andee Poles  . escitalopram (LEXAPRO) 5 MG tablet Take 1 tablet by mouth daily. psych  . hydrochlorothiazide (HYDRODIURIL) 25 MG tablet TAKE 1 TABLET BY MOUTH EVERY DAY  . latanoprost (XALATAN) 0.005 % ophthalmic solution Dr Inez Pilgrim  . montelukast (SINGULAIR) 10 MG tablet Take 1 tablet (10 mg total) by mouth daily.  . Omega-3 Fatty Acids (FISH OIL PO) Take by mouth.  . telmisartan (MICARDIS) 40 MG tablet Take 1 tablet (40 mg total) by mouth daily.  . timolol (TIMOPTIC) 0.5 % ophthalmic solution Dr Inez Pilgrim  . budesonide-formoterol (SYMBICORT) 160-4.5 MCG/ACT inhaler Inhale into the lungs. Dr Andee Poles  . [DISCONTINUED] budesonide (PULMICORT) 0.5 MG/2ML nebulizer solution Dr Andee Poles   No facility-administered encounter medications on file as of 11/23/2019.    Allergies (verified) Amoxicillin-pot clavulanate   History: Past Medical History:  Diagnosis Date  . Anxiety   . Arthritis    knees  . Asthma   . Complication of anesthesia    slowmto wake after C-Section  . Depression   . GERD (gastroesophageal reflux disease)   . Glaucoma   . H/O cervical fracture    age 21, no residual deficits  . Hypertension   . Sinus headache    Past Surgical History:  Procedure Laterality Date  . CESAREAN SECTION    . COLONOSCOPY WITH PROPOFOL N/A 07/13/2019   Procedure: COLONOSCOPY WITH PROPOFOL;  Surgeon: Midge Minium, MD;  [facility];  Service: Endoscopy;  Laterality: N/A;  . ESOPHAGOGASTRODUODENOSCOPY (EGD) WITH PROPOFOL N/A 07/13/2019   Procedure: ESOPHAGOGASTRODUODENOSCOPY (EGD) WITH PROPOFOL;  Surgeon: Midge MiniumWohl, Darren, MD;  [facility];  Service: Endoscopy;  Laterality: N/A;  PRIORITY 3  . OOPHORECTOMY    . TOTAL KNEE ARTHROPLASTY Left 02/29/2015  . VAGINAL HYSTERECTOMY     Family History  Problem Relation Age  of Onset  . Cancer Father   . Heart disease Father   . Cancer Maternal Aunt   . Breast cancer Maternal Aunt 83  . Cancer Paternal Aunt   . Breast cancer Paternal Aunt 40  . Cancer Paternal Uncle   . Breast cancer Sister 450  . Breast cancer Maternal Aunt 2577   Social History   Socioeconomic History  . Marital status: Divorced    Spouse name: Not on file  . Number of children: 2  . Years of education: Not on file  . Highest education level: Not on file  Occupational History  . Not on file  Tobacco Use  . Smoking status: Never Smoker  . Smokeless tobacco: Never Used  Vaping Use  . Vaping Use: Never used  Substance and Sexual Activity  . Alcohol use: No    Alcohol/week: 0.0 standard drinks  . Drug use: No  . Sexual activity: Never  Other Topics Concern  . Not on file  Social History Narrative   Pt lives alone   Social Determinants of Health   Financial Resource Strain: Low Risk   . Difficulty of Paying Living Expenses: Not hard at all  Food Insecurity: No Food Insecurity  . Worried About Programme researcher, broadcasting/film/videounning Out of Food in the Last Year: Never true  . Ran Out of Food in the Last Year: Never true  Transportation Needs: No Transportation Needs  . Lack of Transportation (Medical): No  . Lack of Transportation (Non-Medical): No  Physical Activity: Inactive  . Days of Exercise per Week: 0 days  . Minutes of Exercise per Session: 0 min  Stress: No Stress Concern Present  . Feeling of Stress : Only a little  Social Connections:   . Frequency of Communication with Friends and Family:   . Frequency of Social Gatherings with Friends and Family:   . Attends Religious Services:   . Active Member of Clubs or Organizations:   . Attends BankerClub or Organization Meetings:   Marland Kitchen. Marital Status:     Tobacco Counseling Counseling given: Not Answered   Clinical Intake:  Pre-visit preparation completed: Yes  Pain : No/denies pain     Nutritional Status: BMI > 30  Obese Nutritional Risks:  None Diabetes: No  Interpreter Needed?: No  Information entered by :: Reather LittlerKasey Montay Vanvoorhis LPN   Activities of Daily Living In your present state of health, do you have any difficulty performing the following activities: 11/23/2019 07/13/2019  Hearing? N N  Comment declines hearing aids -  Vision? N N  Difficulty concentrating or making decisions? N N  Walking or climbing stairs? N N  Dressing or bathing? N N  Doing errands, shopping? N -  Preparing Food and eating ? N -  Using the Toilet? N -  In the past six months, have you accidently leaked urine? N -  Do you have problems with loss of bowel control? N -  Managing your Medications? N -  Managing your Finances? N -  Housekeeping or managing your Housekeeping? N -  Some recent data might be hidden    Patient Care Team: Yetta BarreJones,  Vanita Panda, MD as PCP - General (Family Medicine)  Indicate any recent Medical Services you may have received from other than [facility] providers in the past year (date may be approximate).     Assessment:   This is a routine wellness examination for Toni.  Hearing/Vision screen  Hearing Screening   125Hz  250Hz  500Hz  1000Hz  2000Hz  3000Hz  4000Hz  6000Hz  8000Hz   Right ear:           Left ear:           Comments: Pt denies hearing difficulty  Vision Screening Comments: Annual vision screenings at [facility] [address]  Dietary issues and exercise activities discussed: Current Exercise Habits: The patient does not participate in regular exercise at present, Exercise limited by: orthopedic condition(s);respiratory conditions(s)  Goals    .  Weight (lb) < 250 lb (113.4 kg) (pt-stated)      Patient states she would like to lose weight with healthy eating and physical activity       Depression Screen PHQ 2/9 Scores 11/23/2019 10/20/2019 10/17/2018 04/17/2018 10/15/2017 10/17/2015 10/15/2014  PHQ - 2 Score 0 0 0 0 0 0 1  PHQ- 9 Score - 0 0 0 2 - -    Fall Risk Fall Risk  11/23/2019 10/20/2019 10/17/2015  10/15/2014  Falls in the past year? 0 0 No Yes  Number falls in past yr: 0 - - 1  Injury with Fall? 0 - - Yes  Risk for fall due to : No Fall Risks No Fall Risks - -  Follow up Falls prevention discussed Falls evaluation completed - Follow up appointment;Falls prevention discussed;Falls evaluation completed  Comment - - - total knee replacement    Any stairs in or around the home? Yes  If so, are there any without handrails? Yes  Home free of loose throw rugs in walkways, pet beds, electrical cords, etc? Yes  Adequate lighting in your home to reduce risk of falls? Yes   ASSISTIVE DEVICES UTILIZED TO PREVENT FALLS:  Life alert? No  Use of a cane, walker or w/c? No  Grab bars in the bathroom? Yes  Shower chair or bench in shower? Yes  Elevated toilet seat or a handicapped toilet? Yes   TIMED UP AND GO:  Was the test performed? No . Telephonic visit.   Cognitive Function:     6CIT Screen 11/23/2019  What Year? 0 points  What month? 0 points  What time? 0 points  Count back from 20 0 points  Months in reverse 0 points  Repeat phrase 0 points  Total Score 0    Immunizations Immunization History  Administered Date(s) Administered  . Influenza,inj,Quad PF,6+ Mos 01/30/2018, 01/14/2019  . Influenza-Unspecified 02/09/2015  . PFIZER SARS-COV-2 Vaccination 05/11/2019, 06/01/2019  . Pneumococcal Conjugate-13 02/09/2015  . Pneumococcal Polysaccharide-23 03/21/2012  . Tdap 04/17/2018  . Zoster 12/29/2013  . Zoster Recombinat (Shingrix) 01/14/2019, 03/13/2019    TDAP status: Up to date   Flu Vaccine status: Up to date   Pneumococcal vaccine status: Up to date   Covid-19 vaccine status: Completed vaccines  Qualifies for Shingles Vaccine? Yes   Zostavax completed Yes   Shingrix Completed?: Yes  Screening Tests Health Maintenance  Topic Date Due  . Hepatitis C Screening  Never done  . HIV Screening  Never done  . INFLUENZA VACCINE  11/29/2019  . PAP SMEAR-Modifier   04/17/2021  . MAMMOGRAM  05/18/2021  . TETANUS/TDAP  04/17/2028  . COLONOSCOPY  07/12/2029  . COVID-19 Vaccine  Completed    Health Maintenance  Health Maintenance Due  Topic Date Due  . Hepatitis C Screening  Never done  . HIV Screening  Never done    Colorectal cancer screening: Completed 07/13/19. Repeat every 10 years   Mammogram status: Completed 05/19/19. Repeat every year   Bone Density screening: due at age 51  Lung Cancer Screening: (Low Dose CT Chest recommended if Age 6-80 years, 30 pack-year currently smoking OR have quit w/in 15years.) does not qualify.   Additional Screening:  Hepatitis C Screening: does qualify; postponed  Vision Screening: Recommended annual ophthalmology exams for early detection of glaucoma and other disorders of the eye. Is the patient up to date with their annual eye exam?  Yes  Who is the provider or what is the name of the office in which the patient attends annual eye exams? [facility]   Dental Screening: Recommended annual dental exams for proper oral hygiene  Community Resource Referral / Chronic Care Management: CRR required this visit?  No   CCM required this visit?  No      Plan:     I have personally reviewed and noted the following in the patient's chart:   . Medical and social history . Use of alcohol, tobacco or illicit drugs  . Current medications and supplements . Functional ability and status . Nutritional status . Physical activity . Advanced directives . List of other physicians . Hospitalizations, surgeries, and ER visits in previous 12 months . Vitals . Screenings to include cognitive, depression, and falls . Referrals and appointments  In addition, I have reviewed and discussed with patient certain preventive protocols, quality metrics, and best practice recommendations. A written personalized care plan for preventive services as well as general preventive health recommendations were provided to  patient.     Reather Littler, LPN   01/20/3006   Nurse Notes: none

## 2020-04-21 ENCOUNTER — Ambulatory Visit (INDEPENDENT_AMBULATORY_CARE_PROVIDER_SITE_OTHER): Payer: Medicare Other | Admitting: Family Medicine

## 2020-04-21 ENCOUNTER — Other Ambulatory Visit: Payer: Self-pay

## 2020-04-21 ENCOUNTER — Encounter: Payer: Self-pay | Admitting: Family Medicine

## 2020-04-21 VITALS — BP 132/78 | HR 73 | Ht 63.0 in | Wt 272.0 lb

## 2020-04-21 DIAGNOSIS — Z1231 Encounter for screening mammogram for malignant neoplasm of breast: Secondary | ICD-10-CM

## 2020-04-21 DIAGNOSIS — J4520 Mild intermittent asthma, uncomplicated: Secondary | ICD-10-CM

## 2020-04-21 DIAGNOSIS — Z0000 Encounter for general adult medical examination without abnormal findings: Secondary | ICD-10-CM | POA: Diagnosis not present

## 2020-04-21 DIAGNOSIS — I10 Essential (primary) hypertension: Secondary | ICD-10-CM | POA: Diagnosis not present

## 2020-04-21 DIAGNOSIS — E785 Hyperlipidemia, unspecified: Secondary | ICD-10-CM | POA: Diagnosis not present

## 2020-04-21 DIAGNOSIS — Z6841 Body Mass Index (BMI) 40.0 and over, adult: Secondary | ICD-10-CM

## 2020-04-21 MED ORDER — TELMISARTAN 40 MG PO TABS: 40.0000 mg | ORAL_TABLET | Freq: Every day | ORAL | 1 refills | Status: DC

## 2020-04-21 MED ORDER — ALBUTEROL SULFATE HFA 108 (90 BASE) MCG/ACT IN AERS: INHALATION_SPRAY | RESPIRATORY_TRACT | 11 refills | Status: DC

## 2020-04-21 MED ORDER — MONTELUKAST SODIUM 10 MG PO TABS: 10.0000 mg | ORAL_TABLET | Freq: Every day | ORAL | 11 refills | Status: DC

## 2020-04-21 MED ORDER — HYDROCHLOROTHIAZIDE 25 MG PO TABS: ORAL_TABLET | ORAL | 1 refills | Status: DC

## 2020-04-21 NOTE — Progress Notes (Addendum)
Date:  04/21/2020   Name:  Diane Small   DOB:  1956-01-19   MRN:  [id number]   Chief Complaint: Annual Exam (Breast Exam. No Pap. Mammogram.)  Patient is a 64 year old female who presents for a comprehensive physical exam. The patient reports the following problems: none. Health maintenance has been reviewed up to date. Hypertension This is a chronic problem. The current episode started more than 1 year ago. The problem has been gradually improving since onset. The problem is controlled. Pertinent negatives include no anxiety, blurred vision, chest pain, headaches, malaise/fatigue, neck pain, orthopnea, palpitations, peripheral edema, PND, shortness of breath or sweats.  Hyperlipidemia This is a chronic problem. The current episode started more than 1 year ago. The problem is controlled. Recent lipid tests were reviewed and are normal. Pertinent negatives include no chest pain, focal sensory loss, focal weakness, leg pain, myalgias or shortness of breath. Current antihyperlipidemic treatment includes diet change. The current treatment provides mild improvement of lipids. There are no compliance problems.  Risk factors for coronary artery disease include dyslipidemia.    Lab Results  Component Value Date   CREATININE 0.93 04/21/2019   BUN 8 04/21/2019   NA 141 04/21/2019   K 4.5 04/21/2019   CL 99 04/21/2019   CO2 26 04/21/2019   Lab Results  Component Value Date   CHOL 191 04/21/2019   HDL 45 04/21/2019   LDLCALC 115 (H) 04/21/2019   TRIG 176 (H) 04/21/2019   CHOLHDL 3.2 10/15/2017   Lab Results  Component Value Date   TSH 1.210 10/15/2017   No results found for: HGBA1C Lab Results  Component Value Date   WBC 10.9 07/29/2014   HGB 9.9 (L) 07/29/2014   HCT 29.2 (L) 07/29/2014   MCV 84 07/29/2014   PLT 267 07/29/2014   Lab Results  Component Value Date   ALT 22 07/30/2014   AST 45 (H) 07/30/2014   ALKPHOS 49 07/30/2014   BILITOT 0.6 07/30/2014      Review of Systems  Constitutional: Negative.  Negative for chills, fatigue, fever, malaise/fatigue and unexpected weight change.  HENT: Negative for congestion, ear discharge, ear pain, rhinorrhea, sinus pressure, sneezing and sore throat.   Eyes: Negative for blurred vision, double vision, photophobia, pain, discharge, redness and itching.  Respiratory: Negative for cough, shortness of breath, wheezing and stridor.   Cardiovascular: Negative for chest pain, palpitations, orthopnea and PND.  Gastrointestinal: Negative for abdominal pain, blood in stool, constipation, diarrhea, nausea and vomiting.  Endocrine: Negative for cold intolerance, heat intolerance, polydipsia, polyphagia and polyuria.  Genitourinary: Negative for dysuria, flank pain, frequency, hematuria, menstrual problem, pelvic pain, urgency, vaginal bleeding and vaginal discharge.  Musculoskeletal: Negative for arthralgias, back pain, myalgias and neck pain.  Skin: Negative for rash.  Allergic/Immunologic: Negative for environmental allergies and food allergies.  Neurological: Negative for dizziness, focal weakness, weakness, light-headedness, numbness and headaches.  Hematological: Negative for adenopathy. Does not bruise/bleed easily.  Psychiatric/Behavioral: Negative for dysphoric mood. The patient is not nervous/anxious.     Patient Active Problem List   Diagnosis Date Noted  . Pure hypercholesterolemia 04/16/2017  . Hyperlipidemia 04/18/2016  . DDD (degenerative disc disease), lumbosacral 10/17/2015  . Esophageal reflux 04/18/2015  . Essential hypertension 04/18/2015  . Annual physical exam 04/18/2015  . Routine general medical examination at a health care facility 09/01/2014  . Glaucoma 09/01/2014  . BMI 45.0-49.9, adult (HCC) 09/01/2014  . Arthritis of knee, degenerative 09/09/2013    Allergies  Allergen Reactions  . Amoxicillin-Pot Clavulanate Diarrhea    Past Surgical History:  Procedure Laterality  Date  . CESAREAN SECTION    . COLONOSCOPY WITH PROPOFOL N/A 07/13/2019   Procedure: COLONOSCOPY WITH PROPOFOL;  Surgeon: Midge Minium, MD;  [facility];  Service: Endoscopy;  Laterality: N/A;  . ESOPHAGOGASTRODUODENOSCOPY (EGD) WITH PROPOFOL N/A 07/13/2019   Procedure: ESOPHAGOGASTRODUODENOSCOPY (EGD) WITH PROPOFOL;  Surgeon: Midge Minium, MD;  Location: [facility];  Service: Endoscopy;  Laterality: N/A;  PRIORITY 3  . OOPHORECTOMY    . TOTAL KNEE ARTHROPLASTY Left 02/29/2015  . VAGINAL HYSTERECTOMY      Social History   Tobacco Use  . Smoking status: Never Smoker  . Smokeless tobacco: Never Used  Vaping Use  . Vaping Use: Never used  Substance Use Topics  . Alcohol use: No    Alcohol/week: 0.0 standard drinks  . Drug use: No     Medication list has been reviewed and updated.  Current Meds  Medication Sig  . albuterol (VENTOLIN HFA) 108 (90 Base) MCG/ACT inhaler INHALE 1-2 PUFFS INTO THE LUNGS EVERY 6 (SIX) HOURS AS NEEDED FOR WHEEZING OR SHORTNESS OF BREATH.  Marland Kitchen ALPRAZolam (XANAX) 1 MG tablet Take 1 tablet by mouth at bedtime. psych  . ASPIRIN 81 PO Take by mouth daily.  Marland Kitchen azelastine (ASTELIN) 0.1 % nasal spray Dr Andee Poles  . dexlansoprazole (DEXILANT) 60 MG capsule Take by mouth. Dr Andee Poles  . escitalopram (LEXAPRO) 5 MG tablet Take 1 tablet by mouth daily. psych  . hydrochlorothiazide (HYDRODIURIL) 25 MG tablet TAKE 1 TABLET BY MOUTH EVERY DAY  . latanoprost (XALATAN) 0.005 % ophthalmic solution Dr Inez Pilgrim  . montelukast (SINGULAIR) 10 MG tablet Take 1 tablet (10 mg total) by mouth daily.  . Omega-3 Fatty Acids (FISH OIL PO) Take by mouth.  . telmisartan (MICARDIS) 40 MG tablet Take 1 tablet (40 mg total) by mouth daily.  . timolol (TIMOPTIC) 0.5 % ophthalmic solution Dr Inez Pilgrim    [facility] 2/9 Scores 04/21/2020 11/23/2019 10/20/2019 10/17/2018  PHQ - 2 Score 0 0 0 0  PHQ- 9 Score 0 - 0 0    GAD 7 : Generalized Anxiety Score 04/21/2020 10/20/2019   Nervous, Anxious, on Edge 0 0  Control/stop worrying 0 0  Worry too much - different things 0 0  Trouble relaxing 0 0  Restless 0 0  Easily annoyed or irritable 0 0  Afraid - awful might happen 0 0  Total GAD 7 Score 0 0  Anxiety Difficulty Not difficult at all Not difficult at all    BP Readings from Last 3 Encounters:  04/21/20 132/78  10/20/19 (!) 102/52  07/13/19 136/90    Physical Exam Vitals and nursing note reviewed.  Constitutional:      General: She is not in acute distress.    Appearance: She is well-developed. She is obese. She is not diaphoretic.  HENT:     Head: Normocephalic and atraumatic.     Jaw: There is normal jaw occlusion.     Right Ear: Hearing, tympanic membrane, ear canal and external ear normal.     Left Ear: Hearing, tympanic membrane, ear canal and external ear normal.     Nose: Nose normal.     Mouth/Throat:     Lips: Pink.     Mouth: Oropharynx is clear and moist. Mucous membranes are moist.     Dentition: Normal dentition.     Tongue: No lesions.     Palate: No mass.  Pharynx: Oropharynx is clear. Uvula midline.  Eyes:     General: Lids are normal. Vision grossly intact. Gaze aligned appropriately.        Right eye: No discharge.        Left eye: No discharge.     Extraocular Movements: EOM normal.     Conjunctiva/sclera: Conjunctivae normal.     Pupils: Pupils are equal, round, and reactive to light.     Funduscopic exam:    Right eye: Red reflex present.        Left eye: Red reflex present. Neck:     Thyroid: No thyromegaly.     Vascular: Normal carotid pulses. No carotid bruit or JVD.     Trachea: Trachea and phonation normal.  Cardiovascular:     Rate and Rhythm: Normal rate and regular rhythm.     Chest Wall: PMI is not displaced.     Pulses: Normal pulses and intact distal pulses.          Carotid pulses are 2+ on the right side and 2+ on the left side.      Radial pulses are 2+ on the right side and 2+ on the left side.        Femoral pulses are 2+ on the right side and 2+ on the left side.      Popliteal pulses are 2+ on the right side and 2+ on the left side.       Dorsalis pedis pulses are 2+ on the right side and 2+ on the left side.       Posterior tibial pulses are 2+ on the right side and 2+ on the left side.     Heart sounds: Normal heart sounds, S1 normal and S2 normal. No murmur heard.  No systolic murmur is present.  No diastolic murmur is present. No friction rub. No gallop. No S3 sounds.   Pulmonary:     Effort: Pulmonary effort is normal.     Breath sounds: Normal breath sounds and air entry. No decreased breath sounds, wheezing, rhonchi or rales.  Chest:  Breasts: Breasts are symmetrical.     Right: Normal. No swelling, bleeding, inverted nipple, mass, nipple discharge, skin change, tenderness, axillary adenopathy or supraclavicular adenopathy.     Left: Normal. No swelling, bleeding, inverted nipple, mass, nipple discharge, skin change, tenderness, axillary adenopathy or supraclavicular adenopathy.    Abdominal:     General: Bowel sounds are normal.     Palpations: Abdomen is soft. There is no hepatomegaly, splenomegaly or mass.     Tenderness: There is no abdominal tenderness. There is no guarding.  Genitourinary:    Rectum: Normal. Guaiac result negative. No mass.  Musculoskeletal:        General: Normal range of motion.     Cervical back: Full passive range of motion without pain, normal range of motion and neck supple.     Right lower leg: 1+ Pitting Edema present.     Left lower leg: 1+ Pitting Edema present.  Lymphadenopathy:     Head:     Right side of head: No submandibular adenopathy.     Left side of head: No submandibular adenopathy.     Cervical: No cervical adenopathy.     Right cervical: No superficial, deep or posterior cervical adenopathy.    Left cervical: No superficial, deep or posterior cervical adenopathy.     Upper Body:     Right upper body: No  supraclavicular, axillary or pectoral adenopathy.  Left upper body: No supraclavicular, axillary or pectoral adenopathy.  Skin:    General: Skin is warm and dry.     Capillary Refill: Capillary refill takes less than 2 seconds.  Neurological:     Mental Status: She is alert.     Cranial Nerves: Cranial nerves are intact.     Sensory: Sensation is intact.     Motor: Motor function is intact.     Deep Tendon Reflexes: Reflexes are normal and symmetric.  Psychiatric:        Behavior: Behavior is cooperative.     Wt Readings from Last 3 Encounters:  04/21/20 272 lb (123.4 kg)  10/20/19 275 lb (124.7 kg)  07/13/19 274 lb (124.3 kg)    BP 132/78   Pulse 73   Ht 5\' 3"  (1.6 m)   Wt 272 lb (123.4 kg)   SpO2 97%   BMI 48.18 kg/m   Assessment and Plan: 1. Annual physical exam No subjective/objective concerns noted with history and physical exam.Keneshia Pinnix Lin GivensJeffries is a 64 y.o. female who presents today for her Complete Annual Exam. She feels well. She reports exercising . She reports she is sleeping well. Immunizations are reviewed and recommendations provided.   Age appropriate screening tests are discussed. Counseling given for risk factor reduction interventions. - Renal Function Panel will order renal function panel lipid panel for baseline labs for exam. - Lipid Panel With LDL/HDL Ratio  2. Breast cancer screening by mammogram Discussed with patient.  Breast exam done with no noted abnormalities.  Will schedule for 3D screening mammogram. - MM 3D SCREEN BREAST BILATERAL; Future  3. Hyperlipidemia, unspecified hyperlipidemia type Chronic.  Controlled.  Stable.  We will continue omega-3 and diet and weight loss discussed.  4. Essential hypertension Chronic.  Controlled.  Stable.  We will continue Micardis 40 mg once a day and hydrochlorothiazide 25 mg once a day as well.  Will check renal function panel for electrolytes and GFR confirmation. - telmisartan (MICARDIS) 40 MG  tablet; Take 1 tablet (40 mg total) by mouth daily.  Dispense: 90 tablet; Refill: 1 - hydrochlorothiazide (HYDRODIURIL) 25 MG tablet; TAKE 1 TABLET BY MOUTH EVERY DAY  Dispense: 90 tablet; Refill: 1  5. Mild intermittent asthma, unspecified whether complicated Chronic.  Controlled.  Stable.  Continue Singulair 10 mg once a day and albuterol 1 to 2 puffs every 6 hours. - montelukast (SINGULAIR) 10 MG tablet; Take 1 tablet (10 mg total) by mouth daily.  Dispense: 30 tablet; Refill: 11 - albuterol (VENTOLIN HFA) 108 (90 Base) MCG/ACT inhaler; INHALE 1-2 PUFFS INTO THE LUNGS EVERY 6 (SIX) HOURS AS NEEDED FOR WHEEZING OR SHORTNESS OF BREATH.  Dispense: 8 g; Refill: 11  6. BMI 45.0-49.9, adult [facility](HCC) Health risks of being over weight were discussed and patient was counseled on weight loss options and exercise.

## 2020-04-21 NOTE — Patient Instructions (Signed)

## 2020-04-22 LAB — RENAL FUNCTION PANEL
Albumin: 4.1 g/dL (ref 3.8–4.8)
BUN/Creatinine Ratio: 11 — ABNORMAL LOW (ref 12–28)
BUN: 10 mg/dL (ref 8–27)
CO2: 24 mmol/L (ref 20–29)
Calcium: 9.2 mg/dL (ref 8.7–10.3)
Chloride: 98 mmol/L (ref 96–106)
Creatinine, Ser: 0.92 mg/dL (ref 0.57–1.00)
GFR calc Af Amer: 76 mL/min/{1.73_m2} (ref >59)
GFR calc non Af Amer: 66 mL/min/{1.73_m2} (ref >59)
Glucose: 107 mg/dL — ABNORMAL HIGH (ref 65–99)
Phosphorus: 3.6 mg/dL (ref 3.0–4.3)
Potassium: 4.3 mmol/L (ref 3.5–5.2)
Sodium: 139 mmol/L (ref 134–144)

## 2020-04-22 LAB — LIPID PANEL WITH LDL/HDL RATIO
Cholesterol, Total: 204 mg/dL — ABNORMAL HIGH (ref 100–199)
HDL: 53 mg/dL (ref >39)
LDL Chol Calc (NIH): 137 mg/dL — ABNORMAL HIGH (ref 0–99)
LDL/HDL Ratio: 2.6 ratio (ref 0.0–3.2)
Triglycerides: 76 mg/dL (ref 0–149)
VLDL Cholesterol Cal: 14 mg/dL (ref 5–40)

## 2020-05-19 ENCOUNTER — Other Ambulatory Visit: Payer: Self-pay

## 2020-05-19 ENCOUNTER — Ambulatory Visit
Admission: RE | Admit: 2020-05-19 | Discharge: 2020-05-19 | Disposition: A | Discharge status: Home / Self Care | Payer: Medicare Other | Source: Ambulatory Visit | Attending: Family Medicine | Admitting: Family Medicine

## 2020-05-19 DIAGNOSIS — Z1231 Encounter for screening mammogram for malignant neoplasm of breast: Secondary | ICD-10-CM | POA: Diagnosis not present

## 2020-05-23 DIAGNOSIS — H401131 Primary open-angle glaucoma, bilateral, mild stage: Secondary | ICD-10-CM | POA: Diagnosis not present

## 2020-08-02 ENCOUNTER — Other Ambulatory Visit: Payer: Self-pay | Admitting: Family Medicine

## 2020-08-02 DIAGNOSIS — I10 Essential (primary) hypertension: Secondary | ICD-10-CM

## 2020-08-25 ENCOUNTER — Telehealth: Payer: Self-pay | Admitting: Family Medicine

## 2020-08-25 ENCOUNTER — Other Ambulatory Visit: Payer: Self-pay | Admitting: Family Medicine

## 2020-08-25 DIAGNOSIS — I10 Essential (primary) hypertension: Secondary | ICD-10-CM

## 2020-08-25 NOTE — Telephone Encounter (Signed)
Requested medication (s) are due for refill today: historical med   Requested medication (s) are on the active medication list: yes  Last refill:  02/10/2018  Future visit scheduled: yes in 1 month  Notes to clinic:  historical medication. Do you want to renew Rx?     Requested Prescriptions  Pending Prescriptions Disp Refills   dexlansoprazole (DEXILANT) 60 MG capsule 30 capsule     Sig: Take by mouth. Dr Andee Poles      Gastroenterology: Proton Pump Inhibitors Passed - 08/25/2020  3:51 PM      Passed - Valid encounter within last 12 months    Recent Outpatient Visits           4 months ago Annual physical exam   [facility] Duanne Limerick, MD   10 months ago Essential hypertension   [facility] Duanne Limerick, MD   1 year ago Annual physical exam   [facility] Duanne Limerick, MD   1 year ago Essential hypertension   [facility] Duanne Limerick, MD   2 years ago Annual physical exam   [facility] Duanne Limerick, MD       Future Appointments             In 1 month Duanne Limerick, MD [facility]

## 2020-08-25 NOTE — Telephone Encounter (Signed)
Medication Refill - Medication: dexlansoprazole (DEXILANT) 60 MG capsule     Preferred Pharmacy (with phone number or street name): [id number] [place], [place] - [address] Phone:  [phone]  Fax:  [phone]       Agent: Please be advised that RX refills may take up to 3 business days. We ask that you follow-up with your pharmacy.

## 2020-08-26 DIAGNOSIS — M1712 Unilateral primary osteoarthritis, left knee: Secondary | ICD-10-CM | POA: Diagnosis not present

## 2020-08-26 DIAGNOSIS — M25561 Pain in right knee: Secondary | ICD-10-CM | POA: Diagnosis not present

## 2020-09-29 NOTE — Telephone Encounter (Signed)
Pt is calling to check on the status of medication refill.  Pt has CPE appt on Monday.

## 2020-09-30 NOTE — Telephone Encounter (Signed)
Will prescribe on Monday- Dexilant

## 2020-10-03 ENCOUNTER — Other Ambulatory Visit: Payer: Self-pay

## 2020-10-03 ENCOUNTER — Ambulatory Visit (INDEPENDENT_AMBULATORY_CARE_PROVIDER_SITE_OTHER): Payer: Medicare Other | Admitting: Family Medicine

## 2020-10-03 ENCOUNTER — Encounter: Payer: Self-pay | Admitting: Family Medicine

## 2020-10-03 VITALS — BP 124/80 | HR 80 | Ht 63.0 in | Wt 270.0 lb

## 2020-10-03 DIAGNOSIS — M7591 Shoulder lesion, unspecified, right shoulder: Secondary | ICD-10-CM | POA: Diagnosis not present

## 2020-10-03 DIAGNOSIS — I10 Essential (primary) hypertension: Secondary | ICD-10-CM

## 2020-10-03 DIAGNOSIS — E785 Hyperlipidemia, unspecified: Secondary | ICD-10-CM

## 2020-10-03 DIAGNOSIS — K219 Gastro-esophageal reflux disease without esophagitis: Secondary | ICD-10-CM | POA: Diagnosis not present

## 2020-10-03 DIAGNOSIS — J4520 Mild intermittent asthma, uncomplicated: Secondary | ICD-10-CM

## 2020-10-03 MED ORDER — HYDROCHLOROTHIAZIDE 25 MG PO TABS: ORAL_TABLET | ORAL | 1 refills | Status: DC

## 2020-10-03 MED ORDER — DEXLANSOPRAZOLE 60 MG PO CPDR: 60.0000 mg | DELAYED_RELEASE_CAPSULE | Freq: Every day | ORAL | 5 refills | Status: DC

## 2020-10-03 MED ORDER — TELMISARTAN 40 MG PO TABS: 40.0000 mg | ORAL_TABLET | Freq: Every day | ORAL | 1 refills | Status: DC

## 2020-10-03 MED ORDER — ALBUTEROL SULFATE HFA 108 (90 BASE) MCG/ACT IN AERS: INHALATION_SPRAY | RESPIRATORY_TRACT | 11 refills | Status: DC

## 2020-10-03 NOTE — Progress Notes (Signed)
Date:  10/03/2020   Name:  Diane Small   DOB:  05/11/1955   MRN:  [id number]   Chief Complaint: Allergic Rhinitis , Hypertension, and Gastroesophageal Reflux  Hypertension This is a chronic problem. The current episode started more than 1 year ago. The problem has been gradually improving since onset. The problem is controlled. Pertinent negatives include no anxiety, blurred vision, chest pain, headaches, malaise/fatigue, neck pain, orthopnea, palpitations, peripheral edema, PND, shortness of breath or sweats. There are no associated agents to hypertension. Risk factors for coronary artery disease include dyslipidemia and obesity. Past treatments include diuretics and angiotensin blockers. The current treatment provides moderate improvement. There are no compliance problems.  There is no history of angina, kidney disease, CAD/MI, CVA, heart failure, left ventricular hypertrophy, PVD or retinopathy.  Gastroesophageal Reflux She complains of heartburn. She reports no abdominal pain, no belching, no chest pain, no choking, no coughing, no dysphagia, no early satiety, no globus sensation, no hoarse voice, no nausea, no sore throat, no stridor or no wheezing. out of dexilant. This is a chronic problem. The problem occurs occasionally. The problem has been gradually improving. The symptoms are aggravated by certain foods (spicey). Pertinent negatives include no fatigue. She has tried a PPI for the symptoms. The treatment provided moderate relief.  Shoulder Pain  The pain is present in the right shoulder. This is a chronic problem. The current episode started more than 1 year ago. The problem occurs daily. The problem has been waxing and waning. The quality of the pain is described as aching. Associated symptoms include a limited range of motion and stiffness. Pertinent negatives include no fever or numbness. The symptoms are aggravated by activity. Treatments tried: steroid. The treatment  provided mild relief. Her past medical history is significant for osteoarthritis.  URI  This is a chronic (allergic rhinitis) problem. The current episode started more than 1 year ago. The problem has been waxing and waning. Pertinent negatives include no abdominal pain, chest pain, congestion, coughing, diarrhea, dysuria, ear pain, headaches, nausea, neck pain, rash, rhinorrhea, sneezing, sore throat, vomiting or wheezing. She has tried acetaminophen for the symptoms. The treatment provided moderate relief.    Lab Results  Component Value Date   CREATININE 0.92 04/21/2020   BUN 10 04/21/2020   NA 139 04/21/2020   K 4.3 04/21/2020   CL 98 04/21/2020   CO2 24 04/21/2020   Lab Results  Component Value Date   CHOL 204 (H) 04/21/2020   HDL 53 04/21/2020   LDLCALC 137 (H) 04/21/2020   TRIG 76 04/21/2020   CHOLHDL 3.2 10/15/2017   Lab Results  Component Value Date   TSH 1.210 10/15/2017   No results found for: HGBA1C Lab Results  Component Value Date   WBC 10.9 07/29/2014   HGB 9.9 (L) 07/29/2014   HCT 29.2 (L) 07/29/2014   MCV 84 07/29/2014   PLT 267 07/29/2014   Lab Results  Component Value Date   ALT 22 07/30/2014   AST 45 (H) 07/30/2014   ALKPHOS 49 07/30/2014   BILITOT 0.6 07/30/2014     Review of Systems  Constitutional: Negative.  Negative for chills, fatigue, fever, malaise/fatigue and unexpected weight change.  HENT: Negative for congestion, ear discharge, ear pain, hoarse voice, rhinorrhea, sinus pressure, sneezing and sore throat.   Eyes: Negative for blurred vision, photophobia, pain, discharge, redness and itching.  Respiratory: Negative for cough, choking, shortness of breath, wheezing and stridor.   Cardiovascular: Negative for chest  pain, palpitations, orthopnea and PND.  Gastrointestinal: Positive for heartburn. Negative for abdominal pain, blood in stool, constipation, diarrhea, dysphagia, nausea and vomiting.  Endocrine: Negative for cold intolerance,  heat intolerance, polydipsia, polyphagia and polyuria.  Genitourinary: Negative for dysuria, flank pain, frequency, hematuria, menstrual problem, pelvic pain, urgency, vaginal bleeding and vaginal discharge.  Musculoskeletal: Positive for stiffness. Negative for arthralgias, back pain, myalgias and neck pain.  Skin: Negative for rash.  Allergic/Immunologic: Negative for environmental allergies and food allergies.  Neurological: Negative for dizziness, weakness, light-headedness, numbness and headaches.  Hematological: Negative for adenopathy. Does not bruise/bleed easily.  Psychiatric/Behavioral: Negative for dysphoric mood. The patient is not nervous/anxious.     Patient Active Problem List   Diagnosis Date Noted  . Pure hypercholesterolemia 04/16/2017  . Hyperlipidemia 04/18/2016  . DDD (degenerative disc disease), lumbosacral 10/17/2015  . Esophageal reflux 04/18/2015  . Essential hypertension 04/18/2015  . Annual physical exam 04/18/2015  . Routine general medical examination at a health care facility 09/01/2014  . Glaucoma 09/01/2014  . BMI 45.0-49.9, adult (HCC) 09/01/2014  . Arthritis of knee, degenerative 09/09/2013    Allergies  Allergen Reactions  . Amoxicillin-Pot Clavulanate Diarrhea    Past Surgical History:  Procedure Laterality Date  . CESAREAN SECTION    . COLONOSCOPY WITH PROPOFOL N/A 07/13/2019   Procedure: COLONOSCOPY WITH PROPOFOL;  Surgeon: Midge Minium, MD;  Location: [facility];  Service: Endoscopy;  Laterality: N/A;  . ESOPHAGOGASTRODUODENOSCOPY (EGD) WITH PROPOFOL N/A 07/13/2019   Procedure: ESOPHAGOGASTRODUODENOSCOPY (EGD) WITH PROPOFOL;  Surgeon: Midge Minium, MD;  Location: [facility];  Service: Endoscopy;  Laterality: N/A;  PRIORITY 3  . OOPHORECTOMY    . TOTAL KNEE ARTHROPLASTY Left 02/29/2015  . VAGINAL HYSTERECTOMY      Social History   Tobacco Use  . Smoking status: Never Smoker  . Smokeless tobacco: Never Used  Vaping Use   . Vaping Use: Never used  Substance Use Topics  . Alcohol use: No    Alcohol/week: 0.0 standard drinks  . Drug use: No     Medication list has been reviewed and updated.  Current Meds  Medication Sig  . albuterol (VENTOLIN HFA) 108 (90 Base) MCG/ACT inhaler INHALE 1-2 PUFFS INTO THE LUNGS EVERY 6 (SIX) HOURS AS NEEDED FOR WHEEZING OR SHORTNESS OF BREATH.  Marland Kitchen ALPRAZolam (XANAX) 1 MG tablet Take 1 tablet by mouth at bedtime. psych  . ASPIRIN 81 PO Take by mouth daily.  Marland Kitchen azelastine (ASTELIN) 0.1 % nasal spray Dr Andee Poles  . budesonide-formoterol (SYMBICORT) 160-4.5 MCG/ACT inhaler Inhale into the lungs. Dr Andee Poles  . dexlansoprazole (DEXILANT) 60 MG capsule Take by mouth. Dr Andee Poles  . escitalopram (LEXAPRO) 5 MG tablet Take 1 tablet by mouth daily. psych  . hydrochlorothiazide (HYDRODIURIL) 25 MG tablet TAKE 1 TABLET BY MOUTH EVERY DAY  . latanoprost (XALATAN) 0.005 % ophthalmic solution Dr Inez Pilgrim  . montelukast (SINGULAIR) 10 MG tablet Take 1 tablet (10 mg total) by mouth daily.  . Omega-3 Fatty Acids (FISH OIL PO) Take by mouth.  . telmisartan (MICARDIS) 40 MG tablet Take 1 tablet (40 mg total) by mouth daily.  . timolol (TIMOPTIC) 0.5 % ophthalmic solution Dr Inez Pilgrim    [facility] 2/9 Scores 10/03/2020 04/21/2020 11/23/2019 10/20/2019  PHQ - 2 Score 0 0 0 0  PHQ- 9 Score 0 0 - 0    GAD 7 : Generalized Anxiety Score 10/03/2020 04/21/2020 10/20/2019  Nervous, Anxious, on Edge 0 0 0  Control/stop worrying 0 0 0  Worry  too much - different things 0 0 0  Trouble relaxing 0 0 0  Restless 0 0 0  Easily annoyed or irritable 0 0 0  Afraid - awful might happen 0 0 0  Total GAD 7 Score 0 0 0  Anxiety Difficulty - Not difficult at all Not difficult at all    BP Readings from Last 3 Encounters:  10/03/20 124/80  04/21/20 132/78  10/20/19 (!) 102/52    Physical Exam Vitals and nursing note reviewed.  Constitutional:      Appearance: She is well-developed.  HENT:     Head:  Normocephalic.     Right Ear: Tympanic membrane, ear canal and external ear normal.     Left Ear: Tympanic membrane, ear canal and external ear normal.     Nose: Nose normal.     Mouth/Throat:     Mouth: Mucous membranes are moist.  Eyes:     General: Lids are everted, no foreign bodies appreciated. No scleral icterus.       Left eye: No foreign body or hordeolum.     Conjunctiva/sclera: Conjunctivae normal.     Right eye: Right conjunctiva is not injected.     Left eye: Left conjunctiva is not injected.     Pupils: Pupils are equal, round, and reactive to light.  Neck:     Thyroid: No thyromegaly.     Vascular: No JVD.     Trachea: No tracheal deviation.  Cardiovascular:     Rate and Rhythm: Normal rate and regular rhythm.     Heart sounds: Normal heart sounds. No murmur heard. No friction rub. No gallop.   Pulmonary:     Effort: Pulmonary effort is normal. No respiratory distress.     Breath sounds: Normal breath sounds. No wheezing, rhonchi or rales.  Abdominal:     General: Bowel sounds are normal.     Palpations: Abdomen is soft. There is no mass.     Tenderness: There is no abdominal tenderness. There is no guarding or rebound.  Musculoskeletal:        General: No tenderness.     Cervical back: Normal range of motion and neck supple.  Lymphadenopathy:     Cervical: No cervical adenopathy.  Skin:    General: Skin is warm.     Findings: No rash.  Neurological:     Mental Status: She is alert and oriented to person, place, and time.     Cranial Nerves: No cranial nerve deficit.     Deep Tendon Reflexes: Reflexes normal.  Psychiatric:        Mood and Affect: Mood is not anxious or depressed.     Wt Readings from Last 3 Encounters:  10/03/20 270 lb (122.5 kg)  04/21/20 272 lb (123.4 kg)  10/20/19 275 lb (124.7 kg)    BP 124/80   Pulse 80   Ht 5\' 3"  (1.6 m)   Wt 270 lb (122.5 kg)   BMI 47.83 kg/m   Assessment and Plan:  1. Essential hypertension Chronic.   Controlled.  Stable.  Continue hydrochlorothiazide 25 mg once a day.  We will also continue telmisartan 40 mg once a day.  We will recheck electrolytes at next visit. - hydrochlorothiazide (HYDRODIURIL) 25 MG tablet; TAKE 1 TABLET BY MOUTH EVERY DAY  Dispense: 90 tablet; Refill: 1 - telmisartan (MICARDIS) 40 MG tablet; Take 1 tablet (40 mg total) by mouth daily.  Dispense: 90 tablet; Refill: 1  2. Mild intermittent asthma, unspecified whether  complicated Chronic.  Controlled.  Stable.  Continue albuterol inhaler 1 to 2 puffs every 6-8 hours as needed for shortness of breath. - albuterol (VENTOLIN HFA) 108 (90 Base) MCG/ACT inhaler; INHALE 1-2 PUFFS INTO THE LUNGS EVERY 6 (SIX) HOURS AS NEEDED FOR WHEEZING OR SHORTNESS OF BREATH.  Dispense: 8 g; Refill: 11  3. Hyperlipidemia, unspecified hyperlipidemia type Chronic.  Controlled.  Stable.  We will check lipid panel for current level of control. - Lipid Panel With LDL/HDL Ratio  4. Gastroesophageal reflux disease, unspecified whether esophagitis present Chronic.  Controlled.  Stable.  This was [facility]  But would like to continue with [place].  We will refill Dexilant 60 mg once a day. - dexlansoprazole (DEXILANT) 60 MG capsule; Take 1 capsule (60 mg total) by mouth daily. Dr Andee Poles  Dispense: 30 capsule; Refill: 5  5. Right supraspinatus tendinitis New onset.  Persistent.  Limited range of motion and continuous pain.  Patient has exam is consistent with possible supraspinatus tendinitis.  We will refer to sports medicine Dr. Ashley Royalty for evaluation and treatment.

## 2020-10-04 ENCOUNTER — Ambulatory Visit: Payer: Medicare Other | Admitting: Family Medicine

## 2020-10-04 LAB — LIPID PANEL WITH LDL/HDL RATIO
Cholesterol, Total: 172 mg/dL (ref 100–199)
HDL: 48 mg/dL (ref >39)
LDL Chol Calc (NIH): 99 mg/dL (ref 0–99)
LDL/HDL Ratio: 2.1 ratio (ref 0.0–3.2)
Triglycerides: 145 mg/dL (ref 0–149)
VLDL Cholesterol Cal: 25 mg/dL (ref 5–40)

## 2020-10-05 ENCOUNTER — Encounter: Payer: Self-pay | Admitting: Family Medicine

## 2020-10-05 ENCOUNTER — Ambulatory Visit (INDEPENDENT_AMBULATORY_CARE_PROVIDER_SITE_OTHER): Payer: Medicare Other | Admitting: Family Medicine

## 2020-10-05 ENCOUNTER — Other Ambulatory Visit: Payer: Self-pay

## 2020-10-05 VITALS — BP 122/84 | HR 50 | Temp 98.2°F | Ht 63.0 in | Wt 278.0 lb

## 2020-10-05 DIAGNOSIS — M7581 Other shoulder lesions, right shoulder: Secondary | ICD-10-CM | POA: Insufficient documentation

## 2020-10-05 MED ORDER — TRIAMCINOLONE ACETONIDE 40 MG/ML IJ SUSP
40.0000 mg | Freq: Once | INTRAMUSCULAR | Status: AC
  Administered 2020-10-05: 40 mg

## 2020-10-05 NOTE — Assessment & Plan Note (Addendum)
Right-hand-dominant patient presenting with longstanding history of right shoulder pain with recent severe exacerbation over the past several weeks.  She attributes her right shoulder pain to heavy physical demands throughout her life.  She has previously been treated with oral NSAIDs, acetaminophen with limited response.  Currently her pain is lateral shoulder without radiation, aggravated by overhead and reaching activities in addition to ADLs.  Physical exam findings reveals limited overhead motion due to pain, passively can attain full symmetric range of motion, resisted external rotation recreates her symptoms, she does have 4+/5 strength with isolated supraspinatus testing though no pain elicited with resisted testing, positive Neer's and positive Hawkins, nontender bicipital groove, AC joint, trapezius.  After a review of treatments to date and her current symptoms with the patient, she did elect to proceed with ultrasound-guided subacromial corticosteroid injection today.  The nature of triamcinolone and lidocaine were discussed and appropriate dosing for today's injection was reviewed. Patient voiced understanding. Post care reviewed and need for home-based rehab to begin once symptoms respond.  She is to contact us and 6 weeks for any suboptimal progress at which time further evaluation to be considered.

## 2020-10-05 NOTE — Addendum Note (Signed)
Addended by: Lennart Pall on: 10/05/2020 10:25 AM   Modules accepted: Orders

## 2020-10-05 NOTE — Patient Instructions (Signed)
You have just been given a cortisone injection to reduce pain and inflammation. After the injection you may notice immediate relief of pain as a result of the Lidocaine. It is important to rest the area of the injection for 24 to 48 hours after the injection. There is a possibility of some temporary increased discomfort and swelling for up to 72 hours until the cortisone begins to work. If you do have pain, simply rest the joint and use ice. If you can tolerate over the counter medications, you can try Tylenol, Aleve, or Advil for added relief per package instructions.  - Start home exercises with information provided once symptoms respond to cortisone, no later than next week - Contact us at 6 weeks for any lingering symptoms - Otherwise follow-up as-needed

## 2020-10-05 NOTE — Progress Notes (Signed)
Primary Care / Sports Medicine Office Visit  Patient Information:  Patient ID: Diane Small, female DOB: 03-Aug-1955 Age: 65 y.o. MRN: [id number]   Diane Small is a pleasant 65 y.o. female presenting with the following:  Chief Complaint  Patient presents with  . New Patient (Initial Visit)  . Shoulder Pain    Right; x1 year; evaluated by Dr. 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 Rotator cuff tendinitis, right 10/05/2020  . Pure hypercholesterolemia 04/16/2017  . Hyperlipidemia 04/18/2016  . DDD (degenerative disc disease), lumbosacral 10/17/2015  . Esophageal reflux 04/18/2015  . Essential hypertension 04/18/2015  . Annual physical exam 04/18/2015  . Routine general medical examination at a health care facility 09/01/2014  . Glaucoma 09/01/2014  . BMI 45.0-49.9, adult (HCC) 09/01/2014  . Arthritis of knee, degenerative 09/09/2013   Past Medical History:  Diagnosis Date  . Allergy   . Anxiety   . Arthritis    knees  . Asthma   . Complication of anesthesia    slowmto wake after C-Section  . Depression   . GERD (gastroesophageal reflux disease)   . Glaucoma   . H/O cervical fracture    age 64, no residual deficits  . Hypertension   . Sinus headache    Outpatient Encounter Medications as of 10/05/2020  Medication Sig  . acetaminophen (TYLENOL) 500 MG tablet Take 1,000 mg by mouth every 4 (four) hours as needed for moderate pain.  Marland Kitchen albuterol (VENTOLIN HFA) 108 (90 Base) MCG/ACT inhaler INHALE 1-2 PUFFS INTO THE LUNGS EVERY 6 (SIX) HOURS AS NEEDED FOR WHEEZING OR SHORTNESS OF BREATH.  Marland Kitchen ALPRAZolam (XANAX) 1 MG tablet Take 1  tablet by mouth at bedtime. psych  . ASPIRIN 81 PO Take 81 mg by mouth daily.  Marland Kitchen azelastine (ASTELIN) 0.1 % nasal spray Place 1 spray into both nostrils 2 (two) times daily. Dr Andee Poles  . dexlansoprazole (DEXILANT) 60 MG capsule Take 1 capsule (60 mg total) by mouth daily. Dr Andee Poles  . escitalopram (LEXAPRO) 5 MG tablet Take 1 tablet by mouth daily. psych  . hydrochlorothiazide (HYDRODIURIL) 25 MG tablet TAKE 1 TABLET BY MOUTH EVERY DAY  . latanoprost (XALATAN) 0.005 % ophthalmic solution Place 1 drop into both eyes at bedtime. Dr Inez Pilgrim  . levocetirizine (XYZAL) 5 MG tablet Take 5 mg by mouth daily.  . montelukast (SINGULAIR) 10 MG tablet Take 1 tablet (10 mg total) by mouth daily.  . Omega-3 Fatty Acids (FISH OIL PO) Take 1 capsule by mouth daily.  Marland Kitchen telmisartan (MICARDIS) 40 MG tablet Take 1 tablet (40 mg total) by mouth daily.  . timolol (TIMOPTIC) 0.5 % ophthalmic solution Place 1 drop into both eyes daily. Dr Inez Pilgrim  . budesonide-formoterol (SYMBICORT) 160-4.5 MCG/ACT inhaler Inhale 2 puffs into the lungs as needed. Dr Andee Poles   No facility-administered encounter medications on file as of 10/05/2020.   Past Surgical History:  Procedure Laterality Date  . CESAREAN SECTION    . COLONOSCOPY WITH PROPOFOL N/A 07/13/2019   Procedure: COLONOSCOPY WITH PROPOFOL;  Surgeon: Midge Minium, MD;  Location: [facility];  Service: Endoscopy;  Laterality: N/A;  . ESOPHAGOGASTRODUODENOSCOPY (EGD)  WITH PROPOFOL N/A 07/13/2019   Procedure: ESOPHAGOGASTRODUODENOSCOPY (EGD) WITH PROPOFOL;  Surgeon: Midge Minium, MD;  Location: [facility];  Service: Endoscopy;  Laterality: N/A;  PRIORITY 3  . OOPHORECTOMY    . TOTAL KNEE ARTHROPLASTY Left 02/29/2015  . VAGINAL HYSTERECTOMY      Vitals:   10/05/20 0930  BP: 122/84  Pulse: (!) 50  Temp: 98.2 F (36.8 C)  SpO2: 97%   Vitals:   10/05/20 0930  Weight: 278 lb (126.1 kg)  Height: 5\' 3"  (1.6 m)   Body mass index is 49.25  kg/m.  No results found.   Independent interpretation of notes and tests performed by another provider:   None  Procedures performed:   Procedure:  Injection of right subacromial space under ultrasound guidance. Ultrasound guidance utilized for needle placement, visualized supraspinatus shows hypoechogenicity most likely consistent with tendinopathy Samsung HS60 device utilized with permanent recording / reporting. Consent obtained and verified. Noted no overlying erythema, induration, or other signs of local infection. Skin prepped in a sterile fashion. Ethyl chloride spray for topical local analgesia.  Completed without difficulty and tolerated well. Medication: triamcinolone acetonide 40 mg/mL suspension for injection 2 mL total and 2 mL lidocaine 1% without epinephrine utilized for needle placement anesthetic Advised to contact for fevers/chills, erythema, induration, drainage, or persistent bleeding.   Pertinent History, Exam, Impression, and Recommendations:   Rotator cuff tendinitis, right Right-hand-dominant patient presenting with longstanding history of right shoulder pain with recent severe exacerbation over the past several weeks.  She attributes her right shoulder pain to heavy physical demands throughout her life.  She has previously been treated with oral NSAIDs, acetaminophen with limited response.  Currently her pain is lateral shoulder without radiation, aggravated by overhead and reaching activities in addition to ADLs.  Physical exam findings reveals limited overhead motion due to pain, passively can attain full symmetric range of motion, resisted external rotation recreates her symptoms, she does have 4+/5 strength with isolated supraspinatus testing though no pain elicited with resisted testing, positive Neer's and positive Hawkins, nontender bicipital groove, AC joint, trapezius.  After a review of treatments to date and her current symptoms with the patient, she  did elect to proceed with ultrasound-guided subacromial corticosteroid injection today.  The nature of triamcinolone and lidocaine were discussed and appropriate dosing for today's injection was reviewed. Patient voiced understanding. Post care reviewed and need for home-based rehab to begin once symptoms respond.  She is to contact and 6 weeks for any suboptimal progress at which time further evaluation to be considered.    Orders & Medications No orders of the defined types were placed in this encounter.  No orders of the defined types were placed in this encounter.    Return if symptoms worsen or fail to improve.     [place], MD   Primary Care Sports Medicine [facility]

## 2020-11-21 DIAGNOSIS — H401131 Primary open-angle glaucoma, bilateral, mild stage: Secondary | ICD-10-CM | POA: Diagnosis not present

## 2020-11-23 ENCOUNTER — Ambulatory Visit (INDEPENDENT_AMBULATORY_CARE_PROVIDER_SITE_OTHER): Payer: Medicare Other

## 2020-11-23 DIAGNOSIS — Z0000 Encounter for general adult medical examination without abnormal findings: Secondary | ICD-10-CM

## 2020-11-23 NOTE — Progress Notes (Signed)
Subjective:   Diane Small is a 65 y.o. female who presents for Medicare Annual (Subsequent) preventive examination.  Virtual Visit via Telephone Note  I connected with  Diane Small on 11/23/20 at 10:40 AM EDT by telephone and verified that I am speaking with the correct person using two identifiers.  Location: Patient: home Provider: [facility] Persons participating in the virtual visit: patient/Nurse Health Advisor   I discussed the limitations, risks, security and privacy concerns of performing an evaluation and management service by telephone and the availability of in person appointments. The patient expressed understanding and agreed to proceed.  Interactive audio and video telecommunications were attempted between this nurse and patient, however failed, due to patient having technical difficulties OR patient did not have access to video capability.  We continued and completed visit with audio only.  Some vital signs may be absent or patient reported.   Reather Littler, LPN   Review of Systems     Cardiac Risk Factors include: advanced age (>38men, >57 women);hypertension;dyslipidemia     Objective:    Today's Vitals   11/23/20 1049  PainSc: 2    There is no height or weight on file to calculate BMI.  Advanced Directives 11/23/2020 11/23/2019 07/13/2019 03/01/2019  Does Patient Have a Medical Advance Directive? Yes Yes Yes Yes  Type of Estate agent of [place];Living will Healthcare Power of [place];Living will Healthcare Power of [place];Living will Healthcare Power of [place];Living will  Does patient want to make changes to medical advance directive? - - No - Patient declined -  Copy of Healthcare Power of Attorney in Chart? No - copy requested No - copy requested No - copy requested -    Current Medications (verified) Outpatient Encounter Medications as of 11/23/2020  Medication Sig   acetaminophen (TYLENOL) 500 MG tablet Take  1,000 mg by mouth every 4 (four) hours as needed for moderate pain.   albuterol (VENTOLIN HFA) 108 (90 Base) MCG/ACT inhaler INHALE 1-2 PUFFS INTO THE LUNGS EVERY 6 (SIX) HOURS AS NEEDED FOR WHEEZING OR SHORTNESS OF BREATH.   ALPRAZolam (XANAX) 1 MG tablet Take 1 tablet by mouth at bedtime. psych   ASPIRIN 81 PO Take 81 mg by mouth daily.   azelastine (ASTELIN) 0.1 % nasal spray Place 1 spray into both nostrils 2 (two) times daily. Dr Andee Poles   dexlansoprazole (DEXILANT) 60 MG capsule Take 1 capsule (60 mg total) by mouth daily. Dr Andee Poles   escitalopram (LEXAPRO) 5 MG tablet Take 1 tablet by mouth daily. psych   hydrochlorothiazide (HYDRODIURIL) 25 MG tablet TAKE 1 TABLET BY MOUTH EVERY DAY   latanoprost (XALATAN) 0.005 % ophthalmic solution Place 1 drop into both eyes at bedtime. Dr Inez Pilgrim   levocetirizine (XYZAL) 5 MG tablet Take 5 mg by mouth daily.   montelukast (SINGULAIR) 10 MG tablet Take 1 tablet (10 mg total) by mouth daily.   Omega-3 Fatty Acids (FISH OIL PO) Take 1 capsule by mouth daily.   telmisartan (MICARDIS) 40 MG tablet Take 1 tablet (40 mg total) by mouth daily.   timolol (TIMOPTIC) 0.5 % ophthalmic solution Place 1 drop into both eyes daily. Dr Inez Pilgrim   budesonide-formoterol [facility]) 160-4.5 MCG/ACT inhaler Inhale 2 puffs into the lungs as needed. Dr Andee Poles   No facility-administered encounter medications on file as of 11/23/2020.    Allergies (verified) Amoxicillin-pot clavulanate   History: Past Medical History:  Diagnosis Date   Allergy    Anxiety    Arthritis  knees   Asthma    Complication of anesthesia    slowmto wake after C-Section   Depression    GERD (gastroesophageal reflux disease)    Glaucoma    H/O cervical fracture    age 24, no residual deficits   Hypertension    Sinus headache    Past Surgical History:  Procedure Laterality Date   CESAREAN SECTION     COLONOSCOPY WITH PROPOFOL N/A 07/13/2019   Procedure: COLONOSCOPY WITH  PROPOFOL;  Surgeon: Midge Minium, MD;  Location: [facility];  Service: Endoscopy;  Laterality: N/A;   ESOPHAGOGASTRODUODENOSCOPY (EGD) WITH PROPOFOL N/A 07/13/2019   Procedure: ESOPHAGOGASTRODUODENOSCOPY (EGD) WITH PROPOFOL;  Surgeon: Midge Minium, MD;  Location: [facility];  Service: Endoscopy;  Laterality: N/A;  PRIORITY 3   OOPHORECTOMY     TOTAL KNEE ARTHROPLASTY Left 02/29/2015   VAGINAL HYSTERECTOMY     Family History  Problem Relation Age of Onset   Cancer Father    Heart disease Father    Cancer Maternal Aunt    Breast cancer Maternal Aunt 29   Cancer Paternal Aunt    Breast cancer Paternal Aunt 72   Cancer Paternal Uncle    Breast cancer Sister 39   Breast cancer Maternal Aunt 77   Social History   Socioeconomic History   Marital status: Divorced    Spouse name: Not on file   Number of children: 2   Years of education: Not on file   Highest education level: Not on file  Occupational History   Not on file  Tobacco Use   Smoking status: Never   Smokeless tobacco: Never  Vaping Use   Vaping Use: Never used  Substance and Sexual Activity   Alcohol use: No    Alcohol/week: 0.0 standard drinks   Drug use: No   Sexual activity: Never  Other Topics Concern   Not on file  Social History Narrative   Pt lives alone   Social Determinants of Health   Financial Resource Strain: Low Risk    Difficulty of Paying Living Expenses: Not very hard  Food Insecurity: No Food Insecurity   Worried About Programme researcher, broadcasting/film/video in the Last Year: Never true   Ran Out of Food in the Last Year: Never true  Transportation Needs: No Transportation Needs   Lack of Transportation (Medical): No   Lack of Transportation (Non-Medical): No  Physical Activity: Inactive   Days of Exercise per Week: 0 days   Minutes of Exercise per Session: 0 min  Stress: No Stress Concern Present   Feeling of Stress : Not at all  Social Connections: Moderately Isolated   Frequency of  Communication with Friends and Family: More than three times a week   Frequency of Social Gatherings with Friends and Family: More than three times a week   Attends Religious Services: More than 4 times per year   Active Member of [employer] or Organizations: No   Attends Engineer, structural: Never   Marital Status: Divorced    Tobacco Counseling Counseling given: Not Answered   Clinical Intake:  Pre-visit preparation completed: Yes  Pain : 0-10 Pain Score: 2  Pain Type: Chronic pain Pain Location: Knee Pain Orientation: Right Pain Descriptors / Indicators: Aching, Sore Pain Onset: More than a month ago Pain Frequency: Intermittent     Nutritional Risks: None Diabetes: No  How often do you need to have someone help you when you read instructions, pamphlets, or other written materials from  your doctor or pharmacy?: 1 - Never   Interpreter Needed?: No  Information entered by :: Reather LittlerKasey Cline Draheim LPN   Activities of Daily Living In your present state of health, do you have any difficulty performing the following activities: 11/23/2020 10/05/2020  Hearing? N N  Vision? Y Y  Difficulty concentrating or making decisions? N N  Walking or climbing stairs? Y Y  Dressing or bathing? N N  Doing errands, shopping? N N  Preparing Food and eating ? N -  Using the Toilet? N -  In the past six months, have you accidently leaked urine? N -  Do you have problems with loss of bowel control? N -  Managing your Medications? N -  Managing your Finances? N -  Housekeeping or managing your Housekeeping? N -  Some recent data might be hidden    Patient Care Team: Duanne LimerickJones, Deanna C, MD as PCP - General (Family Medicine)  Indicate any recent Medical Services you may have received from other than [facility] providers in the past year (date may be approximate).     Assessment:   This is a routine wellness examination for Diane Small.  Hearing/Vision screen Hearing Screening - Comments:: Pt  denies hearing difficulty Vision Screening - Comments:: [facility] [place]  Dietary issues and exercise activities discussed: Current Exercise Habits: The patient does not participate in regular exercise at present (pt usually goes to the gym but not currently due to heat and also does outside work and [employer] home), Exercise limited by: orthopedic condition(s)   Goals Addressed   None    Depression Screen PHQ 2/9 Scores 11/23/2020 10/05/2020 10/03/2020 04/21/2020 11/23/2019 10/20/2019 10/17/2018  PHQ - 2 Score 0 2 0 0 0 0 0  PHQ- 9 Score 3 7 0 0 - 0 0    Fall Risk Fall Risk  11/23/2020 10/05/2020 04/21/2020 11/23/2019 10/20/2019  Falls in the past year? 0 0 0 0 0  Number falls in past yr: 0 0 0 0 -  Injury with Fall? 0 0 0 0 -  Risk for fall due to : No Fall Risks No Fall Risks - No Fall Risks No Fall Risks  Follow up Falls prevention discussed Falls evaluation completed Falls evaluation completed Falls prevention discussed Falls evaluation completed  Comment - - - - -    FALL RISK PREVENTION PERTAINING TO THE HOME:  Any stairs in or around the home? Yes  If so, are there any without handrails? No  Home free of loose throw rugs in walkways, pet beds, electrical cords, etc? Yes  Adequate lighting in your home to reduce risk of falls? Yes   ASSISTIVE DEVICES UTILIZED TO PREVENT FALLS:  Life alert? No  Use of a cane, walker or w/c? No  Grab bars in the bathroom? Yes  Shower chair or bench in shower? Yes  Elevated toilet seat or a handicapped toilet? No   TIMED UP AND GO:  Was the test performed? telephonic visit.   Cognitive Function: Normal cognitive status assessed by direct observation by this Nurse Health Advisor. No abnormalities found.       6CIT Screen 11/23/2019  What Year? 0 points  What month? 0 points  What time? 0 points  Count back from 20 0 points  Months in reverse 0 points  Repeat phrase 0 points  Total Score 0     Immunizations Immunization History  Administered Date(s) Administered   Influenza,inj,Quad PF,6+ Mos 01/30/2018, 01/14/2019  Influenza-Unspecified 02/09/2015, 12/24/2019   Moderna Sars-Covid-2 Vaccination 07/05/2020   PFIZER(Purple Top)SARS-COV-2 Vaccination 05/11/2019, 06/01/2019, 12/24/2019   PNEUMOCOCCAL CONJUGATE-20 08/05/2020   Pneumococcal Conjugate-13 02/09/2015   Pneumococcal Polysaccharide-23 03/21/2012   Tdap 04/17/2018   Zoster Recombinat (Shingrix) 01/14/2019, 03/13/2019   Zoster, Live 12/29/2013    TDAP status: Up to date  Flu Vaccine status: Up to date  Pneumococcal vaccine status: Up to date  Covid-19 vaccine status: Completed vaccines  Qualifies for Shingles Vaccine? Yes   Zostavax completed Yes   Shingrix Completed?: Yes  Screening Tests Health Maintenance  Topic Date Due   Hepatitis C Screening  10/03/2021 (Originally 03/01/1974)   HIV Screening  10/03/2021 (Originally 03/02/1971)   INFLUENZA VACCINE  11/28/2020   PAP SMEAR-Modifier  04/17/2021   MAMMOGRAM  05/19/2021   TETANUS/TDAP  04/17/2028   COLONOSCOPY (Pts 45-84yrs Insurance coverage will need to be confirmed)  07/12/2029   COVID-19 Vaccine  Completed   Zoster Vaccines- Shingrix  Completed   Pneumococcal Vaccine 53-15 Years old  Aged Out   HPV VACCINES  Aged Out    Health Maintenance  There are no preventive care reminders to display for this patient.  Colorectal cancer screening: Type of screening: Colonoscopy. Completed 07/13/19. Repeat every 10 years  Mammogram status: Completed 05/19/20. Repeat every year  Bone density status: due age 77  Lung Cancer Screening: (Low Dose CT Chest recommended if Age 28-80 years, 30 pack-year currently smoking OR have quit w/in 15years.) does not qualify.   Additional Screening:  Hepatitis C Screening: does qualify; postponed  Vision Screening: Recommended annual ophthalmology exams for early detection of glaucoma and other disorders of the  eye. Is the patient up to date with their annual eye exam?  Yes  Who is the provider or what is the name of the office in which the patient attends annual eye exams? Dr. Inez Pilgrim.   Dental Screening: Recommended annual dental exams for proper oral hygiene  Community Resource Referral / Chronic Care Management: CRR required this visit?  No   CCM required this visit?  No      Plan:     I have personally reviewed and noted the following in the patient's chart:   Medical and social history Use of alcohol, tobacco or illicit drugs  Current medications and supplements including opioid prescriptions.  Functional ability and status Nutritional status Physical activity Advanced directives List of other physicians Hospitalizations, surgeries, and ER visits in previous 12 months Vitals Screenings to include cognitive, depression, and falls Referrals and appointments  In addition, I have reviewed and discussed with patient certain preventive protocols, quality metrics, and best practice recommendations. A written personalized care plan for preventive services as well as general preventive health recommendations were provided to patient.     Reather Littler, LPN   [birth year]   Nurse Notes: pt states she completed ACP documents at [facility] in March 2016. Will contact medical records for information on how to obtain.

## 2020-11-23 NOTE — Patient Instructions (Signed)
[name] , Thank you for taking time to come for your Medicare Wellness Visit. I appreciate your ongoing commitment to your health goals. Please review the following plan we discussed and let me know if I can assist you in the future.   Screening recommendations/referrals: Colonoscopy: done 07/13/19. Repeat in 06/2029 Mammogram: done 05/19/20 Bone Density: due age 65 Recommended yearly ophthalmology/optometry visit for glaucoma screening and checkup Recommended yearly dental visit for hygiene and checkup  Vaccinations: Influenza vaccine: done 12/24/19 Pneumococcal vaccine: done 08/05/20 Tdap vaccine: done 04/17/18 Shingles vaccine: done 01/14/19 & 03/13/19   Covid-19:done 05/11/19, 06/01/19, 12/24/19 & 07/05/20  Advanced directives: We will contact medical records for information on how to obtain these documents.   Conditions/risks identified: Recommend increasing physical activity as tolerated.   Next appointment: Follow up in one year for your annual wellness visit    Preventive Care 65 Years and Older, Female Preventive care refers to lifestyle choices and visits with your health care provider that can promote health and wellness. What does preventive care include? A yearly physical exam. This is also called an annual well check. Dental exams once or twice a year. Routine eye exams. Ask your health care provider how often you should have your eyes checked. Personal lifestyle choices, including: Daily care of your teeth and gums. Regular physical activity. Eating a healthy diet. Avoiding tobacco and drug use. Limiting alcohol use. Practicing safe sex. Taking low-dose aspirin every day. Taking vitamin and mineral supplements as recommended by your health care provider. What happens during an annual well check? The services and screenings done by your health care provider during your annual well check will depend on your age, overall health, lifestyle risk factors, and family history  of disease. Counseling  Your health care provider may ask you questions about your: Alcohol use. Tobacco use. Drug use. Emotional well-being. Home and relationship well-being. Sexual activity. Eating habits. History of falls. Memory and ability to understand (cognition). Work and work Astronomer. Reproductive health. Screening  You may have the following tests or measurements: Height, weight, and BMI. Blood pressure. Lipid and cholesterol levels. These may be checked every 5 years, or more frequently if you are over 58 years old. Skin check. Lung cancer screening. You may have this screening every year starting at age 56 if you have a 30-pack-year history of smoking and currently smoke or have quit within the past 15 years. Fecal occult blood test (FOBT) of the stool. You may have this test every year starting at age 14. Flexible sigmoidoscopy or colonoscopy. You may have a sigmoidoscopy every 5 years or a colonoscopy every 10 years starting at age 71. Hepatitis C blood test. Hepatitis B blood test. Sexually transmitted disease (STD) testing. Diabetes screening. This is done by checking your blood sugar (glucose) after you have not eaten for a while (fasting). You may have this done every 1-3 years. Bone density scan. This is done to screen for osteoporosis. You may have this done starting at age 61. Mammogram. This may be done every 1-2 years. Talk to your health care provider about how often you should have regular mammograms. Talk with your health care provider about your test results, treatment options, and if necessary, the need for more tests. Vaccines  Your health care provider may recommend certain vaccines, such as: Influenza vaccine. This is recommended every year. Tetanus, diphtheria, and acellular pertussis (Tdap, Td) vaccine. You may need a Td booster every 10 years. Zoster vaccine. You may need this after  age 64. Pneumococcal 13-valent conjugate (PCV13) vaccine. One  dose is recommended after age 56. Pneumococcal polysaccharide (PPSV23) vaccine. One dose is recommended after age 8. Talk to your health care provider about which screenings and vaccines you need and how often you need them. This information is not intended to replace advice given to you by your health care provider. Make sure you discuss any questions you have with your health care provider. Document Released: 05/13/2015 Document Revised: 01/04/2016 Document Reviewed: 02/15/2015 Elsevier Interactive Patient Education  2017 [employer] Prevention in the Home Falls can cause injuries. They can happen to people of all ages. There are many things you can do to make your home safe and to help prevent falls. What can I do on the outside of my home? Regularly fix the edges of walkways and driveways and fix any cracks. Remove anything that might make you trip as you walk through a door, such as a raised step or threshold. Trim any bushes or trees on the path to your home. Use bright outdoor lighting. Clear any walking paths of anything that might make someone trip, such as rocks or tools. Regularly check to see if handrails are loose or broken. Make sure that both sides of any steps have handrails. Any raised decks and porches should have guardrails on the edges. Have any leaves, snow, or ice cleared regularly. Use sand or salt on walking paths during winter. Clean up any spills in your garage right away. This includes oil or grease spills. What can I do in the bathroom? Use night lights. Install grab bars by the toilet and in the tub and shower. Do not use towel bars as grab bars. Use non-skid mats or decals in the tub or shower. If you need to sit down in the shower, use a plastic, non-slip stool. Keep the floor dry. Clean up any water that spills on the floor as soon as it happens. Remove soap buildup in the tub or shower regularly. Attach bath mats securely with double-sided  non-slip rug tape. Do not have throw rugs and other things on the floor that can make you trip. What can I do in the bedroom? Use night lights. Make sure that you have a light by your bed that is easy to reach. Do not use any sheets or blankets that are too big for your bed. They should not hang down onto the floor. Have a firm chair that has side arms. You can use this for support while you get dressed. Do not have throw rugs and other things on the floor that can make you trip. What can I do in the kitchen? Clean up any spills right away. Avoid walking on wet floors. Keep items that you use a lot in easy-to-reach places. If you need to reach something above you, use a strong step stool that has a grab bar. Keep electrical cords out of the way. Do not use floor polish or wax that makes floors slippery. If you must use wax, use non-skid floor wax. Do not have throw rugs and other things on the floor that can make you trip. What can I do with my stairs? Do not leave any items on the stairs. Make sure that there are handrails on both sides of the stairs and use them. Fix handrails that are broken or loose. Make sure that handrails are as long as the stairways. Check any carpeting to make sure that it is firmly attached to the stairs.  Fix any carpet that is loose or worn. Avoid having throw rugs at the top or bottom of the stairs. If you do have throw rugs, attach them to the floor with carpet tape. Make sure that you have a light switch at the top of the stairs and the bottom of the stairs. If you do not have them, ask someone to add them for you. What else can I do to help prevent falls? Wear shoes that: Do not have high heels. Have rubber bottoms. Are comfortable and fit you well. Are closed at the toe. Do not wear sandals. If you use a stepladder: Make sure that it is fully opened. Do not climb a closed stepladder. Make sure that both sides of the stepladder are locked into place. Ask  someone to hold it for you, if possible. Clearly mark and make sure that you can see: Any grab bars or handrails. First and last steps. Where the edge of each step is. Use tools that help you move around (mobility aids) if they are needed. These include: Canes. Walkers. Scooters. Crutches. Turn on the lights when you go into a dark area. Replace any light bulbs as soon as they burn out. Set up your furniture so you have a clear path. Avoid moving your furniture around. If any of your floors are uneven, fix them. If there are any pets around you, be aware of where they are. Review your medicines with your doctor. Some medicines can make you feel dizzy. This can increase your chance of falling. Ask your doctor what other things that you can do to help prevent falls. This information is not intended to replace advice given to you by your health care provider. Make sure you discuss any questions you have with your health care provider. Document Released: 02/10/2009 Document Revised: 09/22/2015 Document Reviewed: 05/21/2014 Elsevier Interactive Patient Education  2017 [employer].

## 2020-12-14 DIAGNOSIS — Z79899 Other long term (current) drug therapy: Secondary | ICD-10-CM | POA: Diagnosis not present

## 2020-12-14 DIAGNOSIS — Z733 Stress, not elsewhere classified: Secondary | ICD-10-CM | POA: Diagnosis not present

## 2021-01-13 DIAGNOSIS — Z733 Stress, not elsewhere classified: Secondary | ICD-10-CM | POA: Diagnosis not present

## 2021-01-13 DIAGNOSIS — Z79899 Other long term (current) drug therapy: Secondary | ICD-10-CM | POA: Diagnosis not present

## 2021-02-13 DIAGNOSIS — Z79899 Other long term (current) drug therapy: Secondary | ICD-10-CM | POA: Diagnosis not present

## 2021-02-13 DIAGNOSIS — Z733 Stress, not elsewhere classified: Secondary | ICD-10-CM | POA: Diagnosis not present

## 2021-02-27 ENCOUNTER — Other Ambulatory Visit: Payer: Self-pay | Admitting: Family Medicine

## 2021-02-27 DIAGNOSIS — I10 Essential (primary) hypertension: Secondary | ICD-10-CM

## 2021-02-27 NOTE — Telephone Encounter (Signed)
Just filled 9/6 for 90 days worth, please assess.  Requested Prescriptions  Refused Prescriptions Disp Refills  . hydrochlorothiazide (HYDRODIURIL) 25 MG tablet [Pharmacy Med Name: HYDROCHLOROTHIAZIDE 25 MG TAB] 90 tablet 1    Sig: TAKE 1 TABLET BY MOUTH EVERY DAY     Cardiovascular: Diuretics - Thiazide Passed - 02/27/2021  3:04 PM      Passed - Ca in normal range and within 360 days    Calcium  Date Value Ref Range Status  04/21/2020 9.2 8.7 - 10.3 mg/dL Final   Calcium, Total  Date Value Ref Range Status  07/30/2014 7.4 (L) mg/dL Final    Comment:    6.6-06.0 NOTE: New Reference Range  07/06/14          Passed - Cr in normal range and within 360 days    Creatinine  Date Value Ref Range Status  07/30/2014 0.78 mg/dL Final    Comment:    0.45-9.97 NOTE: New Reference Range  07/06/14    Creatinine, Ser  Date Value Ref Range Status  04/21/2020 0.92 0.57 - 1.00 mg/dL Final         Passed - K in normal range and within 360 days    Potassium  Date Value Ref Range Status  04/21/2020 4.3 3.5 - 5.2 mmol/L Final  07/30/2014 3.8 mmol/L Final    Comment:    3.5-5.1 NOTE: New Reference Range  07/06/14          Passed - Na in normal range and within 360 days    Sodium  Date Value Ref Range Status  04/21/2020 139 134 - 144 mmol/L Final  07/30/2014 130 (L) mmol/L Final    Comment:    135-145 NOTE: New Reference Range  07/06/14          Passed - Last BP in normal range    BP Readings from Last 1 Encounters:  10/05/20 122/84         Passed - Valid encounter within last 6 months    Recent Outpatient Visits          4 months ago Rotator cuff tendinitis, right   [facility] Jerrol Banana, MD   4 months ago Essential hypertension   [facility] Duanne Limerick, MD   10 months ago Annual physical exam   [facility] Duanne Limerick, MD   1 year ago Essential hypertension   [facility] Duanne Limerick, MD   1 year ago  Annual physical exam   [facility] Duanne Limerick, MD      Future Appointments            In 1 month Duanne Limerick, MD [facility]           . telmisartan (MICARDIS) 40 MG tablet [Pharmacy Med Name: TELMISARTAN 40 MG TABLET] 90 tablet 1    Sig: TAKE 1 TABLET BY MOUTH EVERY DAY     Cardiovascular:  Angiotensin Receptor Blockers Failed - 02/27/2021  3:04 PM      Failed - Cr in normal range and within 180 days    Creatinine  Date Value Ref Range Status  07/30/2014 0.78 mg/dL Final    Comment:    7.41-4.23 NOTE: New Reference Range  07/06/14    Creatinine, Ser  Date Value Ref Range Status  04/21/2020 0.92 0.57 - 1.00 mg/dL Final         Failed - K in normal  range and within 180 days    Potassium  Date Value Ref Range Status  04/21/2020 4.3 3.5 - 5.2 mmol/L Final  07/30/2014 3.8 mmol/L Final    Comment:    3.5-5.1 NOTE: New Reference Range  07/06/14          Passed - Patient is not pregnant      Passed - Last BP in normal range    BP Readings from Last 1 Encounters:  10/05/20 122/84         Passed - Valid encounter within last 6 months    Recent Outpatient Visits          4 months ago Rotator cuff tendinitis, right   [facility] Jerrol Banana, MD   4 months ago Essential hypertension   [facility] Duanne Limerick, MD   10 months ago Annual physical exam   [facility] Duanne Limerick, MD   1 year ago Essential hypertension   [facility] Duanne Limerick, MD   1 year ago Annual physical exam   [facility] Duanne Limerick, MD      Future Appointments            In 1 month Duanne Limerick, MD [facility]

## 2021-03-08 DIAGNOSIS — M1711 Unilateral primary osteoarthritis, right knee: Secondary | ICD-10-CM | POA: Diagnosis not present

## 2021-03-16 DIAGNOSIS — Z733 Stress, not elsewhere classified: Secondary | ICD-10-CM | POA: Diagnosis not present

## 2021-03-28 ENCOUNTER — Other Ambulatory Visit: Payer: Self-pay | Admitting: Family Medicine

## 2021-03-28 DIAGNOSIS — K219 Gastro-esophageal reflux disease without esophagitis: Secondary | ICD-10-CM

## 2021-04-05 DIAGNOSIS — M25561 Pain in right knee: Secondary | ICD-10-CM | POA: Diagnosis not present

## 2021-04-05 DIAGNOSIS — M1711 Unilateral primary osteoarthritis, right knee: Secondary | ICD-10-CM | POA: Diagnosis not present

## 2021-04-05 DIAGNOSIS — G8929 Other chronic pain: Secondary | ICD-10-CM | POA: Diagnosis not present

## 2021-04-05 DIAGNOSIS — M7121 Synovial cyst of popliteal space [Baker], right knee: Secondary | ICD-10-CM | POA: Diagnosis not present

## 2021-04-07 ENCOUNTER — Other Ambulatory Visit: Payer: Self-pay | Admitting: Orthopedic Surgery

## 2021-04-18 ENCOUNTER — Other Ambulatory Visit: Payer: Self-pay

## 2021-04-18 ENCOUNTER — Other Ambulatory Visit
Admission: RE | Admit: 2021-04-18 | Discharge: 2021-04-18 | Disposition: A | Discharge status: Home / Self Care | Payer: Medicare Other | Source: Ambulatory Visit | Attending: Orthopedic Surgery | Admitting: Orthopedic Surgery

## 2021-04-18 VITALS — BP 130/80 | HR 66 | Temp 97.5°F | Resp 14 | Ht 63.0 in | Wt 276.0 lb

## 2021-04-18 DIAGNOSIS — Z01810 Encounter for preprocedural cardiovascular examination: Secondary | ICD-10-CM | POA: Diagnosis not present

## 2021-04-18 DIAGNOSIS — E875 Hyperkalemia: Secondary | ICD-10-CM | POA: Diagnosis not present

## 2021-04-18 DIAGNOSIS — Z01818 Encounter for other preprocedural examination: Secondary | ICD-10-CM | POA: Diagnosis not present

## 2021-04-18 DIAGNOSIS — Z01812 Encounter for preprocedural laboratory examination: Secondary | ICD-10-CM

## 2021-04-18 HISTORY — DX: Family history of other specified conditions: Z84.89

## 2021-04-18 HISTORY — DX: Chronic obstructive pulmonary disease, unspecified: J44.9

## 2021-04-18 HISTORY — DX: Pneumonia, unspecified organism: J18.9

## 2021-04-18 HISTORY — DX: Sleep apnea, unspecified: G47.30

## 2021-04-18 HISTORY — DX: Nausea with vomiting, unspecified: R11.2

## 2021-04-18 HISTORY — DX: Other specified postprocedural states: Z98.890

## 2021-04-18 LAB — CBC WITH DIFFERENTIAL/PLATELET
Abs Immature Granulocytes: 0.02 10*3/uL (ref 0.00–0.07)
Basophils Absolute: 0 10*3/uL (ref 0.0–0.1)
Basophils Relative: 1 %
Eosinophils Absolute: 0.2 10*3/uL (ref 0.0–0.5)
Eosinophils Relative: 3 %
HCT: 42.1 % (ref 36.0–46.0)
Hemoglobin: 14.2 g/dL (ref 12.0–15.0)
Immature Granulocytes: 0 %
Lymphocytes Relative: 29 %
Lymphs Abs: 2.4 10*3/uL (ref 0.7–4.0)
MCH: 28.6 pg (ref 26.0–34.0)
MCHC: 33.7 g/dL (ref 30.0–36.0)
MCV: 84.9 fL (ref 80.0–100.0)
Monocytes Absolute: 0.8 10*3/uL (ref 0.1–1.0)
Monocytes Relative: 10 %
Neutro Abs: 4.8 10*3/uL (ref 1.7–7.7)
Neutrophils Relative %: 57 %
Platelets: 412 10*3/uL — ABNORMAL HIGH (ref 150–400)
RBC: 4.96 MIL/uL (ref 3.87–5.11)
RDW: 13.8 % (ref 11.5–15.5)
WBC: 8.3 10*3/uL (ref 4.0–10.5)
nRBC: 0 % (ref 0.0–0.2)

## 2021-04-18 LAB — COMPREHENSIVE METABOLIC PANEL
ALT: 14 U/L (ref 0–44)
AST: 22 U/L (ref 15–41)
Albumin: 3.8 g/dL (ref 3.5–5.0)
Alkaline Phosphatase: 69 U/L (ref 38–126)
Anion gap: 7 (ref 5–15)
BUN: 7 mg/dL — ABNORMAL LOW (ref 8–23)
CO2: 28 mmol/L (ref 22–32)
Calcium: 8.9 mg/dL (ref 8.9–10.3)
Chloride: 102 mmol/L (ref 98–111)
Creatinine, Ser: 0.82 mg/dL (ref 0.44–1.00)
GFR, Estimated: >60 mL/min (ref >60)
Glucose, Bld: 100 mg/dL — ABNORMAL HIGH (ref 70–99)
Potassium: 3 mmol/L — ABNORMAL LOW (ref 3.5–5.1)
Sodium: 137 mmol/L (ref 135–145)
Total Bilirubin: 0.6 mg/dL (ref 0.3–1.2)
Total Protein: 7.4 g/dL (ref 6.5–8.1)

## 2021-04-18 LAB — URINALYSIS, ROUTINE W REFLEX MICROSCOPIC
Bacteria, UA: NONE SEEN
Bilirubin Urine: NEGATIVE
Glucose, UA: NEGATIVE mg/dL
Hgb urine dipstick: NEGATIVE
Ketones, ur: NEGATIVE mg/dL
Nitrite: NEGATIVE
Protein, ur: NEGATIVE mg/dL
Specific Gravity, Urine: 1.013 (ref 1.005–1.030)
pH: 7 (ref 5.0–8.0)

## 2021-04-18 LAB — SURGICAL PCR SCREEN
MRSA, PCR: NEGATIVE
Staphylococcus aureus: NEGATIVE

## 2021-04-18 LAB — TYPE AND SCREEN
ABO/RH(D): O POS
Antibody Screen: NEGATIVE

## 2021-04-18 NOTE — Patient Instructions (Addendum)
Your procedure is scheduled on: Thursday, January 5 Report to the Registration Desk on the 1st floor of the [facility] To find out your arrival time, please call [phone] between 1PM - 3PM on: Wednesday, January 4  REMEMBER: Instructions that are not followed completely may result in serious medical risk, up to and including death; or upon the discretion of your surgeon and anesthesiologist your surgery may need to be rescheduled.  Do not eat food after midnight the night before surgery.  No gum chewing, lozengers or hard candies.  You may however, drink CLEAR liquids up to 2 hours before you are scheduled to arrive for your surgery. Do not drink anything within 2 hours of your scheduled arrival time.  Clear liquids include: - water  - apple juice without pulp - gatorade (not RED, PURPLE, OR BLUE) - black coffee or tea (Do NOT add milk or creamers to the coffee or tea) Do NOT drink anything that is not on this list.  In addition, your doctor has ordered for you to drink the provided  Ensure Pre-Surgery Clear Carbohydrate Drink  Drinking this carbohydrate drink up to two hours before surgery helps to reduce insulin resistance and improve patient outcomes. Please complete drinking 2 hours prior to scheduled arrival time.  TAKE THESE MEDICATIONS THE MORNING OF SURGERY WITH A SIP OF WATER:  Albuterol inhaler Symbicort inhaler Dexlansoprazole (Dexilant) - (take one the night before and one on the morning of surgery - helps to prevent nausea after surgery.) Escitalopram (Lexapro) Timolol eye drops   Use inhalers on the day of surgery and bring to the hospital.  One week prior to surgery: starting December 29 Stop aspirin and Anti-inflammatories (NSAIDS) such as Advil, Aleve, Ibuprofen, Motrin, Naproxen, Naprosyn and Aspirin based products such as Excedrin, Goodys Powder, BC Powder. Stop ANY OVER THE COUNTER supplements until after surgery. Stop fish oil You may however,  continue to take Tylenol if needed for pain up until the day of surgery.  No Alcohol for 24 hours before or after surgery.  No Smoking including e-cigarettes for 24 hours prior to surgery.  No chewable tobacco products for at least 6 hours prior to surgery.  No nicotine patches on the day of surgery.  Do not use any "recreational" drugs for at least a week prior to your surgery.  Please be advised that the combination of cocaine and anesthesia may have negative outcomes, up to and including death. If you test positive for cocaine, your surgery will be cancelled.  On the morning of surgery brush your teeth with toothpaste and water, you may rinse your mouth with mouthwash if you wish. Do not swallow any toothpaste or mouthwash.  Use CHG Soap as directed on instruction sheet.  Do not wear jewelry, make-up, hairpins, clips or nail polish.  Do not wear lotions, powders, or perfumes.   Do not shave body from the neck down 48 hours prior to surgery just in case you cut yourself which could leave a site for infection.  Also, freshly shaved skin may become irritated if using the CHG soap.  Contact lenses, hearing aids and dentures may not be worn into surgery.  Do not bring valuables to the hospital. [facility] is not responsible for any missing/lost belongings or valuables.   Notify your doctor if there is any change in your medical condition (cold, fever, infection).  Wear comfortable clothing (specific to your surgery type) to the hospital.  After surgery, you can help prevent lung complications  by doing breathing exercises.  Take deep breaths and cough every 1-2 hours. Your doctor may order a device called an Incentive Spirometer to help you take deep breaths.  If you are being admitted to the hospital overnight, leave your suitcase in the car. After surgery it may be brought to your room.  If you are taking public transportation, you will need to have a responsible adult (18 years  or older) with you. Please confirm with your physician that it is acceptable to use public transportation.   Please call the Pre-admissions Testing Dept. at [phone] if you have any questions about these instructions.  Surgery Visitation Policy:  Patients undergoing a surgery or procedure may have one family member or support person with them as long as that person is not COVID-19 positive or experiencing its symptoms.  That person may remain in the waiting area during the procedure and may rotate out with other people.  Inpatient Visitation:    Visiting hours are 7 a.m. to 8 p.m. Up to two visitors ages 16+ are allowed at one time in a patient room. The visitors may rotate out with other people during the day. Visitors must check out when they leave, or other visitors will not be allowed. One designated support person may remain overnight. The visitor must pass COVID-19 screenings, use hand sanitizer when entering and exiting the patients room and wear a mask at all times, including in the patients room. Patients must also wear a mask when staff or their visitor are in the room. Masking is required regardless of vaccination status.

## 2021-04-18 NOTE — Progress Notes (Signed)
°  [facility] Perioperative Services: Pre-Admission/Anesthesia Testing  Abnormal Lab Notification   Date: 04/18/21  Name: Diane Small MRN:   [id number]  Re: Abnormal labs noted during PAT appointment   Notified:  Provider Name Provider Role Notification Mode  Kennedy Bucker, MD Orthopedic Surgery Routed and/or faxed via [facility]   Clinical Information and Notes:  ABNORMAL LAB VALUE(S): Lab Results  Component Value Date   K 3.0 (L) 04/18/2021   Diane Small is scheduled for an elective RIGHT TOTAL KNEE ARTHROPLASTY on 05/04/2020. In review of her medication reconciliation, it is noted that the patient is taking prescribed diuretic medications (HCTZ 25 mg). Please note, in efforts to promote a safe and effective anesthetic course, per current guidelines/standards set by the [facility] anesthesia team, the minimal acceptable K+ level for the patient to proceed with general anesthesia is 3.0 mmol/L. With that being said, if the patient drops any lower, her elective procedure will need to be postponed until K+ is better optimized. Abnormal result is being forwarded to primary attending surgeon for review and consideration of optimization. Order placed to have K+ rechecked on the day of her procedure to ensure correction of the noted derangement.    Quentin Mulling, MSN, APRN, FNP-C, CEN [facility]  Peri-operative Services Nurse Practitioner Phone: [phone] Fax: [phone] 04/18/21 12:00 PM

## 2021-04-25 ENCOUNTER — Ambulatory Visit (INDEPENDENT_AMBULATORY_CARE_PROVIDER_SITE_OTHER): Payer: Medicare Other | Admitting: Family Medicine

## 2021-04-25 ENCOUNTER — Other Ambulatory Visit: Payer: Self-pay

## 2021-04-25 ENCOUNTER — Encounter: Payer: Self-pay | Admitting: Family Medicine

## 2021-04-25 VITALS — BP 120/80 | HR 64 | Ht 63.0 in | Wt 274.0 lb

## 2021-04-25 DIAGNOSIS — Z1231 Encounter for screening mammogram for malignant neoplasm of breast: Secondary | ICD-10-CM | POA: Diagnosis not present

## 2021-04-25 DIAGNOSIS — Z0000 Encounter for general adult medical examination without abnormal findings: Secondary | ICD-10-CM | POA: Diagnosis not present

## 2021-04-25 DIAGNOSIS — I10 Essential (primary) hypertension: Secondary | ICD-10-CM
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
## 2021-04-29 ENCOUNTER — Other Ambulatory Visit: Payer: Self-pay | Admitting: Family Medicine

## 2021-04-29 DIAGNOSIS — J4520 Mild intermittent asthma, uncomplicated: Secondary | ICD-10-CM

## 2021-04-29 NOTE — Telephone Encounter (Signed)
Requested Prescriptions  Pending Prescriptions Disp Refills   montelukast (SINGULAIR) 10 MG tablet [Pharmacy Med Name: MONTELUKAST SOD 10 MG TABLET] 90 tablet 3    Sig: TAKE 1 TABLET BY MOUTH EVERY DAY     Pulmonology:  Leukotriene Inhibitors Passed - 04/29/2021  2:22 AM      Passed - Valid encounter within last 12 months    Recent Outpatient Visits          4 days ago Essential hypertension   [facility] Duanne Limerick, MD   6 months ago Rotator cuff tendinitis, right   [facility] Jerrol Banana, MD   6 months ago Essential hypertension   [facility] Duanne Limerick, MD   1 year ago Annual physical exam   [facility] Duanne Limerick, MD   1 year ago Essential hypertension   [facility] Duanne Limerick, MD      Future Appointments            In 5 months Duanne Limerick, MD [facility]

## 2021-04-30 ENCOUNTER — Other Ambulatory Visit: Payer: Self-pay | Admitting: Family Medicine

## 2021-04-30 DIAGNOSIS — I10 Essential (primary) hypertension: Secondary | ICD-10-CM

## 2021-05-02 ENCOUNTER — Other Ambulatory Visit
Admission: RE | Admit: 2021-05-02 | Discharge: 2021-05-02 | Disposition: A | Discharge status: Home / Self Care | Payer: Medicare Other | Source: Ambulatory Visit | Attending: Orthopedic Surgery | Admitting: Orthopedic Surgery

## 2021-05-02 DIAGNOSIS — I10 Essential (primary) hypertension: Secondary | ICD-10-CM | POA: Diagnosis not present

## 2021-05-02 DIAGNOSIS — Z9114 Patient's other noncompliance with medication regimen: Secondary | ICD-10-CM | POA: Diagnosis not present

## 2021-05-02 DIAGNOSIS — E785 Hyperlipidemia, unspecified: Secondary | ICD-10-CM | POA: Diagnosis not present

## 2021-05-02 DIAGNOSIS — Z96651 Presence of right artificial knee joint: Secondary | ICD-10-CM | POA: Diagnosis not present

## 2021-05-02 DIAGNOSIS — J449 Chronic obstructive pulmonary disease, unspecified: Secondary | ICD-10-CM | POA: Diagnosis not present

## 2021-05-02 DIAGNOSIS — E7800 Pure hypercholesterolemia, unspecified: Secondary | ICD-10-CM | POA: Diagnosis not present

## 2021-05-02 DIAGNOSIS — M25561 Pain in right knee: Secondary | ICD-10-CM | POA: Diagnosis not present

## 2021-05-02 DIAGNOSIS — Z20822 Contact with and (suspected) exposure to covid-19: Secondary | ICD-10-CM | POA: Diagnosis not present

## 2021-05-02 DIAGNOSIS — Z91128 Patient's intentional underdosing of medication regimen for other reason: Secondary | ICD-10-CM | POA: Diagnosis not present

## 2021-05-02 DIAGNOSIS — M7581 Other shoulder lesions, right shoulder: Secondary | ICD-10-CM

## 2021-05-02 DIAGNOSIS — M5030 Other cervical disc degeneration, unspecified cervical region: Secondary | ICD-10-CM | POA: Diagnosis not present

## 2021-05-02 DIAGNOSIS — M1990 Unspecified osteoarthritis, unspecified site: Secondary | ICD-10-CM | POA: Diagnosis not present

## 2021-05-02 DIAGNOSIS — E871 Hypo-osmolality and hyponatremia: Secondary | ICD-10-CM | POA: Diagnosis not present

## 2021-05-02 DIAGNOSIS — F32A Depression, unspecified: Secondary | ICD-10-CM | POA: Diagnosis not present

## 2021-05-02 DIAGNOSIS — M1711 Unilateral primary osteoarthritis, right knee: Secondary | ICD-10-CM | POA: Diagnosis not present

## 2021-05-02 DIAGNOSIS — T39396A Underdosing of other nonsteroidal anti-inflammatory drugs [NSAID], initial encounter: Secondary | ICD-10-CM | POA: Diagnosis not present

## 2021-05-02 DIAGNOSIS — Z79899 Other long term (current) drug therapy: Secondary | ICD-10-CM | POA: Diagnosis not present

## 2021-05-02 DIAGNOSIS — H409 Unspecified glaucoma: Secondary | ICD-10-CM | POA: Diagnosis not present

## 2021-05-02 DIAGNOSIS — G4730 Sleep apnea, unspecified: Secondary | ICD-10-CM | POA: Diagnosis not present

## 2021-05-02 DIAGNOSIS — M5137 Other intervertebral disc degeneration, lumbosacral region: Secondary | ICD-10-CM | POA: Diagnosis not present

## 2021-05-02 DIAGNOSIS — K219 Gastro-esophageal reflux disease without esophagitis: Secondary | ICD-10-CM | POA: Diagnosis not present

## 2021-05-02 DIAGNOSIS — Z880 Allergy status to penicillin: Secondary | ICD-10-CM | POA: Diagnosis not present

## 2021-05-02 DIAGNOSIS — Z01812 Encounter for preprocedural laboratory examination: Secondary | ICD-10-CM | POA: Insufficient documentation

## 2021-05-02 DIAGNOSIS — Z7982 Long term (current) use of aspirin: Secondary | ICD-10-CM | POA: Diagnosis not present

## 2021-05-02 DIAGNOSIS — Z96652 Presence of left artificial knee joint: Secondary | ICD-10-CM | POA: Diagnosis not present

## 2021-05-02 LAB — SARS CORONAVIRUS 2 (TAT 6-24 HRS): SARS Coronavirus 2: NEGATIVE

## 2021-05-04 ENCOUNTER — Ambulatory Visit: Payer: Medicare Other | Admitting: Urgent Care

## 2021-05-04 ENCOUNTER — Encounter
Admission: RE | Disposition: A | Discharge status: Skilled Nursing Facility | Payer: Self-pay | Source: Home / Self Care | Attending: Orthopedic Surgery

## 2021-05-04 ENCOUNTER — Inpatient Hospital Stay
Admission: RE | Admit: 2021-05-04 | Discharge: 2021-05-08 | DRG: 470 | Disposition: A | Discharge status: Skilled Nursing Facility | Payer: Medicare Other | Attending: Orthopedic Surgery | Admitting: Orthopedic Surgery

## 2021-05-04 ENCOUNTER — Ambulatory Visit: Payer: Medicare Other | Admitting: Anesthesiology

## 2021-05-04 ENCOUNTER — Other Ambulatory Visit: Payer: Self-pay

## 2021-05-04 ENCOUNTER — Encounter: Payer: Self-pay | Admitting: Orthopedic Surgery

## 2021-05-04 ENCOUNTER — Observation Stay: Payer: Medicare Other

## 2021-05-04 DIAGNOSIS — I10 Essential (primary) hypertension: Secondary | ICD-10-CM | POA: Diagnosis present

## 2021-05-04 DIAGNOSIS — G4730 Sleep apnea, unspecified: Secondary | ICD-10-CM | POA: Diagnosis present

## 2021-05-04 DIAGNOSIS — G8918 Other acute postprocedural pain: Secondary | ICD-10-CM

## 2021-05-04 DIAGNOSIS — E871 Hypo-osmolality and hyponatremia: Secondary | ICD-10-CM | POA: Diagnosis present

## 2021-05-04 DIAGNOSIS — Z8249 Family history of ischemic heart disease and other diseases of the circulatory system: Secondary | ICD-10-CM

## 2021-05-04 DIAGNOSIS — Z6841 Body Mass Index (BMI) 40.0 and over, adult: Secondary | ICD-10-CM

## 2021-05-04 DIAGNOSIS — H409 Unspecified glaucoma: Secondary | ICD-10-CM | POA: Diagnosis present

## 2021-05-04 DIAGNOSIS — Z79899 Other long term (current) drug therapy: Secondary | ICD-10-CM

## 2021-05-04 DIAGNOSIS — M5030 Other cervical disc degeneration, unspecified cervical region: Secondary | ICD-10-CM | POA: Diagnosis present

## 2021-05-04 DIAGNOSIS — J449 Chronic obstructive pulmonary disease, unspecified: Secondary | ICD-10-CM | POA: Diagnosis present

## 2021-05-04 DIAGNOSIS — Z419 Encounter for procedure for purposes other than remedying health state, unspecified: Secondary | ICD-10-CM

## 2021-05-04 DIAGNOSIS — Z9114 Patient's other noncompliance with medication regimen: Secondary | ICD-10-CM

## 2021-05-04 DIAGNOSIS — E785 Hyperlipidemia, unspecified: Secondary | ICD-10-CM | POA: Diagnosis not present

## 2021-05-04 DIAGNOSIS — Z880 Allergy status to penicillin: Secondary | ICD-10-CM

## 2021-05-04 DIAGNOSIS — Z96651 Presence of right artificial knee joint: Secondary | ICD-10-CM | POA: Diagnosis not present

## 2021-05-04 DIAGNOSIS — Z7982 Long term (current) use of aspirin: Secondary | ICD-10-CM

## 2021-05-04 DIAGNOSIS — Z8701 Personal history of pneumonia (recurrent): Secondary | ICD-10-CM

## 2021-05-04 DIAGNOSIS — Z8261 Family history of arthritis: Secondary | ICD-10-CM

## 2021-05-04 DIAGNOSIS — M1711 Unilateral primary osteoarthritis, right knee: Secondary | ICD-10-CM | POA: Diagnosis not present

## 2021-05-04 DIAGNOSIS — F32A Depression, unspecified: Secondary | ICD-10-CM | POA: Diagnosis present

## 2021-05-04 DIAGNOSIS — F419 Anxiety disorder, unspecified: Secondary | ICD-10-CM | POA: Diagnosis present

## 2021-05-04 DIAGNOSIS — Z90710 Acquired absence of both cervix and uterus: Secondary | ICD-10-CM

## 2021-05-04 DIAGNOSIS — Z96652 Presence of left artificial knee joint: Secondary | ICD-10-CM | POA: Diagnosis present

## 2021-05-04 DIAGNOSIS — E7800 Pure hypercholesterolemia, unspecified: Secondary | ICD-10-CM | POA: Diagnosis present

## 2021-05-04 DIAGNOSIS — K219 Gastro-esophageal reflux disease without esophagitis: Secondary | ICD-10-CM | POA: Diagnosis present

## 2021-05-04 DIAGNOSIS — M1990 Unspecified osteoarthritis, unspecified site: Secondary | ICD-10-CM | POA: Diagnosis present

## 2021-05-04 DIAGNOSIS — Z91128 Patient's intentional underdosing of medication regimen for other reason: Secondary | ICD-10-CM

## 2021-05-04 DIAGNOSIS — Z96659 Presence of unspecified artificial knee joint: Secondary | ICD-10-CM

## 2021-05-04 DIAGNOSIS — Z20822 Contact with and (suspected) exposure to covid-19: Secondary | ICD-10-CM | POA: Diagnosis present

## 2021-05-04 DIAGNOSIS — T39396A Underdosing of other nonsteroidal anti-inflammatory drugs [NSAID], initial encounter: Secondary | ICD-10-CM | POA: Diagnosis present

## 2021-05-04 DIAGNOSIS — M5137 Other intervertebral disc degeneration, lumbosacral region: Secondary | ICD-10-CM | POA: Diagnosis present

## 2021-05-04 HISTORY — PX: TOTAL KNEE ARTHROPLASTY: SHX125

## 2021-05-04 LAB — TYPE AND SCREEN
ABO/RH(D): O POS
Antibody Screen: NEGATIVE

## 2021-05-04 LAB — POCT I-STAT, CHEM 8
BUN: 4 mg/dL — ABNORMAL LOW (ref 8–23)
Calcium, Ion: 1.08 mmol/L — ABNORMAL LOW (ref 1.15–1.40)
Chloride: 106 mmol/L (ref 98–111)
Creatinine, Ser: 0.8 mg/dL (ref 0.44–1.00)
Glucose, Bld: 97 mg/dL (ref 70–99)
HCT: 40 % (ref 36.0–46.0)
Hemoglobin: 13.6 g/dL (ref 12.0–15.0)
Potassium: 3.7 mmol/L (ref 3.5–5.1)
Sodium: 142 mmol/L (ref 135–145)
TCO2: 25 mmol/L (ref 22–32)

## 2021-05-04 LAB — CBC
HCT: 39.3 % (ref 36.0–46.0)
Hemoglobin: 12.3 g/dL (ref 12.0–15.0)
MCH: 27.8 pg (ref 26.0–34.0)
MCHC: 31.3 g/dL (ref 30.0–36.0)
MCV: 88.9 fL (ref 80.0–100.0)
Platelets: 369 10*3/uL (ref 150–400)
RBC: 4.42 MIL/uL (ref 3.87–5.11)
RDW: 14.3 % (ref 11.5–15.5)
WBC: 16.7 10*3/uL — ABNORMAL HIGH (ref 4.0–10.5)
nRBC: 0 % (ref 0.0–0.2)

## 2021-05-04 LAB — CREATININE, SERUM
Creatinine, Ser: 0.81 mg/dL (ref 0.44–1.00)
GFR, Estimated: >60 mL/min (ref >60)

## 2021-05-04 SURGERY — ARTHROPLASTY, KNEE, TOTAL
Anesthesia: Spinal | Site: Knee | Laterality: Right

## 2021-05-04 MED ORDER — FENTANYL CITRATE (PF) 100 MCG/2ML IJ SOLN
INTRAMUSCULAR | Status: AC
  Administered 2021-05-04: 25 ug via INTRAVENOUS
  Filled 2021-05-04: qty 2

## 2021-05-04 MED ORDER — METHOCARBAMOL 500 MG PO TABS
ORAL_TABLET | ORAL | Status: AC
  Administered 2021-05-04: 500 mg via ORAL
  Filled 2021-05-04: qty 1

## 2021-05-04 MED ORDER — PANTOPRAZOLE SODIUM 40 MG PO TBEC
40.0000 mg | DELAYED_RELEASE_TABLET | Freq: Every day | ORAL | Status: DC
  Administered 2021-05-05 – 2021-05-08 (×4): 40 mg via ORAL
  Filled 2021-05-04 (×4): qty 1

## 2021-05-04 MED ORDER — CHLORHEXIDINE GLUCONATE 0.12 % MT SOLN
OROMUCOSAL | Status: AC
  Administered 2021-05-04: 15 mL via OROMUCOSAL
  Filled 2021-05-04: qty 15

## 2021-05-04 MED ORDER — FENTANYL CITRATE (PF) 100 MCG/2ML IJ SOLN
25.0000 ug | INTRAMUSCULAR | Status: DC | PRN
  Administered 2021-05-04: 25 ug via INTRAVENOUS
  Administered 2021-05-04: 50 ug via INTRAVENOUS
  Administered 2021-05-04: 25 ug via INTRAVENOUS

## 2021-05-04 MED ORDER — SODIUM CHLORIDE (PF) 0.9 % IJ SOLN
INTRAMUSCULAR | Status: DC | PRN
  Administered 2021-05-04: 91 mL via INTRAMUSCULAR

## 2021-05-04 MED ORDER — ALBUTEROL SULFATE (2.5 MG/3ML) 0.083% IN NEBU
3.0000 mL | INHALATION_SOLUTION | Freq: Four times a day (QID) | RESPIRATORY_TRACT | Status: DC
  Administered 2021-05-04 – 2021-05-06 (×6): 3 mL via RESPIRATORY_TRACT
  Filled 2021-05-04: qty 9
  Filled 2021-05-04 (×4): qty 3

## 2021-05-04 MED ORDER — AZELASTINE HCL 0.1 % NA SOLN
1.0000 | Freq: Two times a day (BID) | NASAL | Status: DC
  Administered 2021-05-04 – 2021-05-08 (×7): 1 via NASAL
  Filled 2021-05-04 (×2): qty 30

## 2021-05-04 MED ORDER — ALBUTEROL SULFATE (2.5 MG/3ML) 0.083% IN NEBU
INHALATION_SOLUTION | RESPIRATORY_TRACT | Status: AC
  Filled 2021-05-04: qty 3

## 2021-05-04 MED ORDER — ONDANSETRON HCL 4 MG/2ML IJ SOLN: 4.0000 mg | Freq: Four times a day (QID) | INTRAMUSCULAR | Status: DC | PRN

## 2021-05-04 MED ORDER — CEFAZOLIN IN SODIUM CHLORIDE 3-0.9 GM/100ML-% IV SOLN
3.0000 g | INTRAVENOUS | Status: AC
  Administered 2021-05-04: 3 g via INTRAVENOUS
  Filled 2021-05-04: qty 100

## 2021-05-04 MED ORDER — HYDROCODONE-ACETAMINOPHEN 7.5-325 MG PO TABS
1.0000 | ORAL_TABLET | ORAL | Status: DC | PRN
  Administered 2021-05-05 – 2021-05-08 (×10): 2 via ORAL
  Filled 2021-05-04 (×4): qty 2
  Filled 2021-05-04: qty 1
  Filled 2021-05-04 (×6): qty 2

## 2021-05-04 MED ORDER — ORAL CARE MOUTH RINSE: 15.0000 mL | Freq: Once | OROMUCOSAL | Status: AC

## 2021-05-04 MED ORDER — ACETAMINOPHEN 325 MG PO TABS: 325.0000 mg | ORAL_TABLET | Freq: Four times a day (QID) | ORAL | Status: DC | PRN

## 2021-05-04 MED ORDER — FENTANYL CITRATE (PF) 100 MCG/2ML IJ SOLN
INTRAMUSCULAR | Status: AC
  Filled 2021-05-04: qty 2

## 2021-05-04 MED ORDER — METHOCARBAMOL 500 MG PO TABS
ORAL_TABLET | ORAL | Status: AC
  Filled 2021-05-04: qty 1

## 2021-05-04 MED ORDER — SODIUM CHLORIDE FLUSH 0.9 % IV SOLN
INTRAVENOUS | Status: AC
  Filled 2021-05-04: qty 40

## 2021-05-04 MED ORDER — MOMETASONE FURO-FORMOTEROL FUM 200-5 MCG/ACT IN AERO
2.0000 | INHALATION_SPRAY | Freq: Two times a day (BID) | RESPIRATORY_TRACT | Status: DC
Start: 2021-05-04 — End: 2021-05-04

## 2021-05-04 MED ORDER — LEVOCETIRIZINE DIHYDROCHLORIDE 5 MG PO TABS: 5.0000 mg | ORAL_TABLET | Freq: Every day | ORAL | Status: DC

## 2021-05-04 MED ORDER — PHENYLEPHRINE HCL (PRESSORS) 10 MG/ML IV SOLN
INTRAVENOUS | Status: DC | PRN
  Administered 2021-05-04: 100 ug via INTRAVENOUS

## 2021-05-04 MED ORDER — IRBESARTAN 150 MG PO TABS
150.0000 mg | ORAL_TABLET | Freq: Every day | ORAL | Status: DC
  Administered 2021-05-05: 150 mg via ORAL
  Filled 2021-05-04: qty 1

## 2021-05-04 MED ORDER — ZOLPIDEM TARTRATE 5 MG PO TABS: 5.0000 mg | ORAL_TABLET | Freq: Every evening | ORAL | Status: DC | PRN

## 2021-05-04 MED ORDER — TRAMADOL HCL 50 MG PO TABS
ORAL_TABLET | ORAL | Status: AC
  Filled 2021-05-04: qty 1

## 2021-05-04 MED ORDER — APREPITANT 40 MG PO CAPS: 40.0000 mg | ORAL_CAPSULE | Freq: Once | ORAL | Status: AC

## 2021-05-04 MED ORDER — ESCITALOPRAM OXALATE 10 MG PO TABS
5.0000 mg | ORAL_TABLET | Freq: Every day | ORAL | Status: DC
  Administered 2021-05-05 – 2021-05-08 (×4): 5 mg via ORAL
  Filled 2021-05-04 (×4): qty 0.5

## 2021-05-04 MED ORDER — TIMOLOL MALEATE 0.5 % OP SOLN
1.0000 [drp] | Freq: Every day | OPHTHALMIC | Status: DC
  Administered 2021-05-05 – 2021-05-08 (×4): 1 [drp] via OPHTHALMIC
  Filled 2021-05-04 (×2): qty 5

## 2021-05-04 MED ORDER — LORATADINE 10 MG PO TABS
10.0000 mg | ORAL_TABLET | Freq: Every day | ORAL | Status: DC
  Administered 2021-05-05 – 2021-05-08 (×4): 10 mg via ORAL
  Filled 2021-05-04 (×4): qty 1

## 2021-05-04 MED ORDER — OXYCODONE HCL 5 MG PO TABS: 5.0000 mg | ORAL_TABLET | Freq: Once | ORAL | Status: DC | PRN

## 2021-05-04 MED ORDER — MOMETASONE FURO-FORMOTEROL FUM 200-5 MCG/ACT IN AERO
2.0000 | INHALATION_SPRAY | Freq: Two times a day (BID) | RESPIRATORY_TRACT | Status: DC | PRN
  Filled 2021-05-04 (×2): qty 8.8

## 2021-05-04 MED ORDER — PROPOFOL 10 MG/ML IV BOLUS
INTRAVENOUS | Status: AC
  Filled 2021-05-04: qty 20

## 2021-05-04 MED ORDER — MORPHINE SULFATE (PF) 4 MG/ML IV SOLN
INTRAVENOUS | Status: AC
  Administered 2021-05-04: 1 mg via INTRAVENOUS
  Filled 2021-05-04: qty 1

## 2021-05-04 MED ORDER — PHENYLEPHRINE HCL (PRESSORS) 10 MG/ML IV SOLN
INTRAVENOUS | Status: AC
  Filled 2021-05-04: qty 1

## 2021-05-04 MED ORDER — PRONTOSAN WOUND IRRIGATION OPTIME
TOPICAL | Status: DC | PRN
  Administered 2021-05-04: 1 via TOPICAL

## 2021-05-04 MED ORDER — HYDROCHLOROTHIAZIDE 25 MG PO TABS
25.0000 mg | ORAL_TABLET | Freq: Every day | ORAL | Status: DC
  Administered 2021-05-05: 25 mg via ORAL
  Filled 2021-05-04: qty 1

## 2021-05-04 MED ORDER — APREPITANT 40 MG PO CAPS
ORAL_CAPSULE | ORAL | Status: AC
  Administered 2021-05-04: 40 mg via ORAL
  Filled 2021-05-04: qty 1

## 2021-05-04 MED ORDER — METHOCARBAMOL 1000 MG/10ML IJ SOLN
500.0000 mg | Freq: Four times a day (QID) | INTRAVENOUS | Status: DC | PRN
  Filled 2021-05-04: qty 5

## 2021-05-04 MED ORDER — LATANOPROST 0.005 % OP SOLN
1.0000 [drp] | Freq: Every day | OPHTHALMIC | Status: DC
  Administered 2021-05-05 – 2021-05-07 (×3): 1 [drp] via OPHTHALMIC
  Filled 2021-05-04 (×2): qty 2.5

## 2021-05-04 MED ORDER — ONDANSETRON HCL 4 MG/2ML IJ SOLN
4.0000 mg | Freq: Once | INTRAMUSCULAR | Status: AC | PRN
  Administered 2021-05-04: 4 mg via INTRAVENOUS

## 2021-05-04 MED ORDER — ENOXAPARIN SODIUM 30 MG/0.3ML IJ SOSY
30.0000 mg | PREFILLED_SYRINGE | Freq: Two times a day (BID) | INTRAMUSCULAR | Status: DC
  Administered 2021-05-05 – 2021-05-08 (×7): 30 mg via SUBCUTANEOUS
  Filled 2021-05-04 (×8): qty 0.3

## 2021-05-04 MED ORDER — ASPIRIN EC 325 MG PO TBEC
325.0000 mg | DELAYED_RELEASE_TABLET | Freq: Every day | ORAL | Status: DC
  Administered 2021-05-05 – 2021-05-08 (×4): 325 mg via ORAL
  Filled 2021-05-04 (×3): qty 1

## 2021-05-04 MED ORDER — SODIUM CHLORIDE 0.9 % IV SOLN: INTRAVENOUS | Status: DC

## 2021-05-04 MED ORDER — BISACODYL 10 MG RE SUPP
10.0000 mg | Freq: Every day | RECTAL | Status: DC | PRN
  Administered 2021-05-08: 10 mg via RECTAL
  Filled 2021-05-04 (×3): qty 1

## 2021-05-04 MED ORDER — ACETAMINOPHEN 10 MG/ML IV SOLN
INTRAVENOUS | Status: AC
  Filled 2021-05-04: qty 100

## 2021-05-04 MED ORDER — MORPHINE SULFATE (PF) 4 MG/ML IV SOLN
0.5000 mg | INTRAVENOUS | Status: DC | PRN
  Administered 2021-05-04 – 2021-05-05 (×2): 1 mg via INTRAVENOUS

## 2021-05-04 MED ORDER — HYDROCODONE-ACETAMINOPHEN 7.5-325 MG PO TABS
ORAL_TABLET | ORAL | Status: AC
  Administered 2021-05-04: 2 via ORAL
  Filled 2021-05-04: qty 2

## 2021-05-04 MED ORDER — SODIUM CHLORIDE 0.9 % IR SOLN: Status: DC | PRN

## 2021-05-04 MED ORDER — NEOMYCIN-POLYMYXIN B GU 40-200000 IR SOLN
Status: AC
  Filled 2021-05-04: qty 20

## 2021-05-04 MED ORDER — ALUM & MAG HYDROXIDE-SIMETH 200-200-20 MG/5ML PO SUSP
30.0000 mL | ORAL | Status: DC | PRN
  Administered 2021-05-06: 30 mL via ORAL
  Filled 2021-05-04: qty 30

## 2021-05-04 MED ORDER — OXYCODONE HCL 5 MG PO TABS
ORAL_TABLET | ORAL | Status: AC
  Filled 2021-05-04: qty 1

## 2021-05-04 MED ORDER — GLYCOPYRROLATE 0.2 MG/ML IJ SOLN
INTRAMUSCULAR | Status: DC | PRN
  Administered 2021-05-04: .2 mg via INTRAVENOUS

## 2021-05-04 MED ORDER — HYDROCODONE-ACETAMINOPHEN 5-325 MG PO TABS
1.0000 | ORAL_TABLET | ORAL | Status: DC | PRN
  Administered 2021-05-05: 2 via ORAL
  Filled 2021-05-04: qty 2

## 2021-05-04 MED ORDER — HYDROCODONE-ACETAMINOPHEN 5-325 MG PO TABS
ORAL_TABLET | ORAL | Status: AC
  Administered 2021-05-04: 1 via ORAL
  Filled 2021-05-04: qty 1

## 2021-05-04 MED ORDER — MENTHOL 3 MG MT LOZG
1.0000 | LOZENGE | OROMUCOSAL | Status: DC | PRN
  Filled 2021-05-04: qty 9

## 2021-05-04 MED ORDER — TRAMADOL HCL 50 MG PO TABS
50.0000 mg | ORAL_TABLET | Freq: Four times a day (QID) | ORAL | Status: DC
  Administered 2021-05-04 – 2021-05-08 (×13): 50 mg via ORAL
  Filled 2021-05-04 (×11): qty 1

## 2021-05-04 MED ORDER — POTASSIUM CHLORIDE ER 10 MEQ PO TBCR
10.0000 meq | EXTENDED_RELEASE_TABLET | Freq: Two times a day (BID) | ORAL | Status: DC
  Administered 2021-05-04 – 2021-05-08 (×8): 10 meq via ORAL
  Filled 2021-05-04 (×16): qty 1

## 2021-05-04 MED ORDER — CEFAZOLIN SODIUM-DEXTROSE 2-4 GM/100ML-% IV SOLN
INTRAVENOUS | Status: AC
  Administered 2021-05-04: 2 g via INTRAVENOUS
  Filled 2021-05-04: qty 100

## 2021-05-04 MED ORDER — BUPIVACAINE LIPOSOME 1.3 % IJ SUSP
INTRAMUSCULAR | Status: AC
  Filled 2021-05-04: qty 20

## 2021-05-04 MED ORDER — BUPIVACAINE HCL (PF) 0.5 % IJ SOLN
INTRAMUSCULAR | Status: DC | PRN
Start: 2021-05-04 — End: 2021-05-04
  Administered 2021-05-04: 2.3 mL via INTRATHECAL

## 2021-05-04 MED ORDER — MIDAZOLAM HCL 5 MG/5ML IJ SOLN
INTRAMUSCULAR | Status: DC | PRN
  Administered 2021-05-04: 2 mg via INTRAVENOUS

## 2021-05-04 MED ORDER — MIDAZOLAM HCL 2 MG/2ML IJ SOLN
INTRAMUSCULAR | Status: AC
  Filled 2021-05-04: qty 2

## 2021-05-04 MED ORDER — PROPOFOL 10 MG/ML IV BOLUS
INTRAVENOUS | Status: DC | PRN
Start: 2021-05-04 — End: 2021-05-04
  Administered 2021-05-04: 40 mg via INTRAVENOUS
  Administered 2021-05-04: 30 mg via INTRAVENOUS
  Administered 2021-05-04: 40 mg via INTRAVENOUS
  Administered 2021-05-04 (×3): 30 mg via INTRAVENOUS

## 2021-05-04 MED ORDER — BUPIVACAINE-EPINEPHRINE (PF) 0.25% -1:200000 IJ SOLN
INTRAMUSCULAR | Status: AC
  Filled 2021-05-04: qty 30

## 2021-05-04 MED ORDER — MORPHINE SULFATE (PF) 10 MG/ML IV SOLN
INTRAVENOUS | Status: AC
  Filled 2021-05-04: qty 1

## 2021-05-04 MED ORDER — CHLORHEXIDINE GLUCONATE 0.12 % MT SOLN: 15.0000 mL | Freq: Once | OROMUCOSAL | Status: AC

## 2021-05-04 MED ORDER — METOCLOPRAMIDE HCL 5 MG/ML IJ SOLN: 5.0000 mg | Freq: Three times a day (TID) | INTRAMUSCULAR | Status: DC | PRN

## 2021-05-04 MED ORDER — POLYETHYLENE GLYCOL 3350 17 G PO PACK
17.0000 g | PACK | Freq: Every day | ORAL | Status: DC | PRN
  Administered 2021-05-07 – 2021-05-08 (×2): 17 g via ORAL
  Filled 2021-05-04 (×3): qty 1

## 2021-05-04 MED ORDER — CEFAZOLIN SODIUM-DEXTROSE 2-4 GM/100ML-% IV SOLN: 2.0000 g | Freq: Four times a day (QID) | INTRAVENOUS | Status: AC

## 2021-05-04 MED ORDER — FLEET ENEMA 7-19 GM/118ML RE ENEM: 1.0000 | ENEMA | Freq: Once | RECTAL | Status: DC | PRN

## 2021-05-04 MED ORDER — LACTATED RINGERS IV SOLN: INTRAVENOUS | Status: DC

## 2021-05-04 MED ORDER — METOCLOPRAMIDE HCL 5 MG PO TABS
5.0000 mg | ORAL_TABLET | Freq: Three times a day (TID) | ORAL | Status: DC | PRN
  Filled 2021-05-04: qty 2

## 2021-05-04 MED ORDER — PHENYLEPHRINE HCL-NACL 20-0.9 MG/250ML-% IV SOLN
INTRAVENOUS | Status: DC | PRN
  Administered 2021-05-04: 25 ug/min via INTRAVENOUS

## 2021-05-04 MED ORDER — MORPHINE SULFATE (PF) 4 MG/ML IV SOLN
INTRAVENOUS | Status: AC
  Filled 2021-05-04: qty 1

## 2021-05-04 MED ORDER — SODIUM CHLORIDE FLUSH 0.9 % IV SOLN
INTRAVENOUS | Status: AC
  Filled 2021-05-04: qty 10

## 2021-05-04 MED ORDER — DIPHENHYDRAMINE HCL 12.5 MG/5ML PO ELIX
12.5000 mg | ORAL_SOLUTION | ORAL | Status: DC | PRN
  Filled 2021-05-04: qty 10

## 2021-05-04 MED ORDER — DOCUSATE SODIUM 100 MG PO CAPS
ORAL_CAPSULE | ORAL | Status: AC
  Administered 2021-05-04: 100 mg via ORAL
  Filled 2021-05-04: qty 1

## 2021-05-04 MED ORDER — MONTELUKAST SODIUM 10 MG PO TABS
10.0000 mg | ORAL_TABLET | Freq: Every day | ORAL | Status: DC
  Administered 2021-05-05 – 2021-05-08 (×4): 10 mg via ORAL
  Filled 2021-05-04 (×4): qty 1

## 2021-05-04 MED ORDER — PHENOL 1.4 % MT LIQD
1.0000 | OROMUCOSAL | Status: DC | PRN
  Filled 2021-05-04: qty 177

## 2021-05-04 MED ORDER — ACETAMINOPHEN 10 MG/ML IV SOLN
INTRAVENOUS | Status: DC | PRN
  Administered 2021-05-04: 1000 mg via INTRAVENOUS

## 2021-05-04 MED ORDER — ONDANSETRON HCL 4 MG PO TABS
4.0000 mg | ORAL_TABLET | Freq: Four times a day (QID) | ORAL | Status: DC | PRN
  Filled 2021-05-04: qty 1

## 2021-05-04 MED ORDER — ALPRAZOLAM 0.5 MG PO TABS
2.0000 mg | ORAL_TABLET | Freq: Every day | ORAL | Status: DC
  Administered 2021-05-05 – 2021-05-07 (×3): 2 mg via ORAL
  Filled 2021-05-04 (×3): qty 4

## 2021-05-04 MED ORDER — ALPRAZOLAM 0.5 MG PO TABS
ORAL_TABLET | ORAL | Status: AC
  Administered 2021-05-04: 2 mg via ORAL
  Filled 2021-05-04: qty 4

## 2021-05-04 MED ORDER — PROPOFOL 500 MG/50ML IV EMUL
INTRAVENOUS | Status: DC | PRN
  Administered 2021-05-04: 50 ug/kg/min via INTRAVENOUS

## 2021-05-04 MED ORDER — OXYCODONE HCL 5 MG/5ML PO SOLN: 5.0000 mg | Freq: Once | ORAL | Status: DC | PRN

## 2021-05-04 MED ORDER — PROPOFOL 1000 MG/100ML IV EMUL
INTRAVENOUS | Status: AC
  Filled 2021-05-04: qty 100

## 2021-05-04 MED ORDER — ONDANSETRON HCL 4 MG/2ML IJ SOLN
INTRAMUSCULAR | Status: DC | PRN
Start: 2021-05-04 — End: 2021-05-04
  Administered 2021-05-04: 4 mg via INTRAVENOUS

## 2021-05-04 MED ORDER — ONDANSETRON HCL 4 MG/2ML IJ SOLN
INTRAMUSCULAR | Status: AC
  Filled 2021-05-04: qty 2

## 2021-05-04 MED ORDER — DOCUSATE SODIUM 100 MG PO CAPS
100.0000 mg | ORAL_CAPSULE | Freq: Two times a day (BID) | ORAL | Status: DC
  Administered 2021-05-05 – 2021-05-08 (×6): 100 mg via ORAL
  Filled 2021-05-04 (×6): qty 1

## 2021-05-04 MED ORDER — ACETAMINOPHEN 10 MG/ML IV SOLN: 1000.0000 mg | Freq: Once | INTRAVENOUS | Status: DC | PRN

## 2021-05-04 MED ORDER — METHOCARBAMOL 500 MG PO TABS
500.0000 mg | ORAL_TABLET | Freq: Four times a day (QID) | ORAL | Status: DC | PRN
  Administered 2021-05-04 – 2021-05-08 (×5): 500 mg via ORAL
  Filled 2021-05-04 (×3): qty 1

## 2021-05-04 MED ORDER — PROPOFOL 10 MG/ML IV BOLUS
INTRAVENOUS | Status: AC
  Filled 2021-05-04: qty 40

## 2021-05-04 SURGICAL SUPPLY — 73 items
BLADE SAGITTAL 25.0X1.19X90 (BLADE) ×2 IMPLANT
BLADE SAW 90X13X1.19 OSCILLAT (BLADE) ×2 IMPLANT
BNDG ELASTIC 6X5.8 VLCR STR LF (GAUZE/BANDAGES/DRESSINGS) ×2 IMPLANT
CANISTER WOUND CARE 500ML ATS (WOUND CARE) ×2 IMPLANT
CEMENT HV SMART SET (Cement) ×4 IMPLANT
CEMENT PATELLA RESURF SZ1 (Cement) ×1 IMPLANT
CHLORAPREP W/TINT 26 (MISCELLANEOUS) ×4 IMPLANT
COOLER POLAR GLACIER W/PUMP (MISCELLANEOUS) ×2 IMPLANT
CUFF TOURN SGL QUICK 24 (TOURNIQUET CUFF)
CUFF TOURN SGL QUICK 34 (TOURNIQUET CUFF)
CUFF TRNQT CYL 24X4X16.5-23 (TOURNIQUET CUFF) IMPLANT
CUFF TRNQT CYL 34X4.125X (TOURNIQUET CUFF) IMPLANT
DRAPE 3/4 80X56 (DRAPES) ×4 IMPLANT
DRAPE INCISE IOBAN 66X45 STRL (DRAPES) ×1 IMPLANT
DRSG MEPILEX SACRM 8.7X9.8 (GAUZE/BANDAGES/DRESSINGS) ×2 IMPLANT
ELECT CAUTERY BLADE 6.4 (BLADE) ×2 IMPLANT
ELECT REM PT RETURN 9FT ADLT (ELECTROSURGICAL) ×2
ELECTRODE REM PT RTRN 9FT ADLT (ELECTROSURGICAL) ×1 IMPLANT
FEMORAL COMPONENT RIGHT SZ2P (Femur) ×1 IMPLANT
GAUZE 4X4 16PLY ~~LOC~~+RFID DBL (SPONGE) ×2 IMPLANT
GAUZE SPONGE 4X4 12PLY STRL (GAUZE/BANDAGES/DRESSINGS) ×1 IMPLANT
GAUZE XEROFORM 1X8 LF (GAUZE/BANDAGES/DRESSINGS) ×1 IMPLANT
GLOVE SURG ORTHO LTX SZ8 (GLOVE) ×2 IMPLANT
GLOVE SURG SYN 9.0  PF PI (GLOVE) ×1
GLOVE SURG SYN 9.0 PF PI (GLOVE) ×1 IMPLANT
GLOVE SURG UNDER LTX SZ8 (GLOVE) ×2 IMPLANT
GLOVE SURG UNDER POLY LF SZ9 (GLOVE) ×2 IMPLANT
GOWN SRG 2XL LVL 4 RGLN SLV (GOWNS) ×1 IMPLANT
GOWN STRL NON-REIN 2XL LVL4 (GOWNS) ×1
GOWN STRL REUS W/ TWL LRG LVL3 (GOWN DISPOSABLE) ×1 IMPLANT
GOWN STRL REUS W/ TWL XL LVL3 (GOWN DISPOSABLE) ×1 IMPLANT
GOWN STRL REUS W/TWL LRG LVL3 (GOWN DISPOSABLE) ×1
GOWN STRL REUS W/TWL XL LVL3 (GOWN DISPOSABLE) ×1
HOLDER FOLEY CATH W/STRAP (MISCELLANEOUS) ×1 IMPLANT
INSERT TIBIAL SZ2 RT (Insert) ×1 IMPLANT
IV NS IRRIG 3000ML ARTHROMATIC (IV SOLUTION) ×2 IMPLANT
KIT PREVENA INCISION MGT20CM45 (CANNISTER) ×2 IMPLANT
KIT TURNOVER KIT A (KITS) ×2 IMPLANT
MANIFOLD NEPTUNE II (INSTRUMENTS) ×3 IMPLANT
NDL SAFETY ECLIPSE 18X1.5 (NEEDLE) ×1 IMPLANT
NDL SPNL 18GX3.5 QUINCKE PK (NEEDLE) ×1 IMPLANT
NDL SPNL 20GX3.5 QUINCKE YW (NEEDLE) ×1 IMPLANT
NEEDLE HYPO 18GX1.5 SHARP (NEEDLE) ×1
NEEDLE SPNL 18GX3.5 QUINCKE PK (NEEDLE) ×2 IMPLANT
NEEDLE SPNL 20GX3.5 QUINCKE YW (NEEDLE) ×2 IMPLANT
NS IRRIG 1000ML POUR BTL (IV SOLUTION) ×2 IMPLANT
PACK TOTAL KNEE (MISCELLANEOUS) ×2 IMPLANT
PAD WRAPON POLAR KNEE (MISCELLANEOUS) ×1 IMPLANT
PAD WRAPON POLOR MULTI XL (MISCELLANEOUS) IMPLANT
PENCIL SMOKE EVACUATOR COATED (MISCELLANEOUS) ×2 IMPLANT
PULSAVAC PLUS IRRIG FAN TIP (DISPOSABLE) ×2
SCALPEL PROTECTED #10 DISP (BLADE) ×4 IMPLANT
SOLUTION PRONTOSAN WOUND 350ML (IRRIGATION / IRRIGATOR) ×1 IMPLANT
SPONGE T-LAP 18X18 ~~LOC~~+RFID (SPONGE) ×6 IMPLANT
STAPLER SKIN PROX 35W (STAPLE) ×2 IMPLANT
STEM EXTENSION 11MMX30MM (Stem) ×1 IMPLANT
SUCTION FRAZIER HANDLE 10FR (MISCELLANEOUS) ×1
SUCTION TUBE FRAZIER 10FR DISP (MISCELLANEOUS) ×1 IMPLANT
SUT DVC 2 QUILL PDO  T11 36X36 (SUTURE) ×1
SUT DVC 2 QUILL PDO T11 36X36 (SUTURE) ×1 IMPLANT
SUT ETHIBOND 2 V 37 (SUTURE) IMPLANT
SUT V-LOC 90 ABS DVC 3-0 CL (SUTURE) ×2 IMPLANT
SYR 20ML LL LF (SYRINGE) ×2 IMPLANT
SYR 50ML LL SCALE MARK (SYRINGE) ×4 IMPLANT
TIB TRAY SZ 2 R FIXED (Joint) ×1 IMPLANT
TIP FAN IRRIG PULSAVAC PLUS (DISPOSABLE) ×1 IMPLANT
TOWEL OR 17X26 4PK STRL BLUE (TOWEL DISPOSABLE) ×1 IMPLANT
TOWER CARTRIDGE SMART MIX (DISPOSABLE) ×2 IMPLANT
TRAY FOLEY MTR SLVR 16FR STAT (SET/KITS/TRAYS/PACK) ×2 IMPLANT
WATER STERILE IRR 500ML POUR (IV SOLUTION) ×2 IMPLANT
WRAP-ON POLOR PAD MULTI XL (MISCELLANEOUS) ×1
WRAPON POLAR PAD KNEE (MISCELLANEOUS)
WRAPON POLOR PAD MULTI XL (MISCELLANEOUS) ×1

## 2021-05-04 NOTE — TOC Progression Note (Addendum)
Transition of Care [facility]) - Progression Note    Patient Details  Name: Diane Small MRN: [id number] Date of Birth: [birth year]  Transition of Care [facility]) CM/SW Contact  Marlowe Sax, RN Phone Number: 05/04/2021, 9:44 AM  Clinical Narrative:    The patient is set up with [facility] for Home health services, Awaiting PT eval for recommendations, TOC to follow and assist with needs Per conversation with PT [place], she plans to go to [facility] SNF [place], [facility] sent to find a STR bed Will review the bed offers once obtained        Expected Discharge Plan and Services                                                 Social Determinants of Health (SDOH) Interventions    Readmission Risk Interventions No flowsheet data found.

## 2021-05-04 NOTE — Evaluation (Signed)
Physical Therapy Evaluation Patient Details Name: Diane Small MRN: [id number] DOB: 02/10/1956 Today's Date: 05/04/2021  History of Present Illness  66 y.o. female s/p R TKA.  Clinical Impression  Pt received supine in bed, agreeable to PT. [place] and has no caregiver assist at d/c. She is independent with mobility and ADLs at baseline. Raboxin given at beginning of session for pain control. Pt sat up to EOB with CGA, heavy reliance on bed rails. MOD A for lifting to stand from EOB, she ambulated 7220ft using RW and CGA. She required MOD A to return back to bed. Towards end of session, pt was reporting lethargy - pt suspects from pain meds given prior to session. Exercise packet deferred to next session due to eyes drifting closed. PT recommends pt d/c to SNF for rehab prior to returning home due to lack of caregiver assistance. Would benefit from skilled PT to address above deficits and promote optimal return to PLOF.       Recommendations for follow up therapy are one component of a multi-disciplinary discharge planning process, led by the attending physician.  Recommendations may be updated based on patient status, additional functional criteria and insurance authorization.  Follow Up Recommendations Skilled nursing-short term rehab (<3 hours/day)    Assistance Recommended at Discharge Intermittent Supervision/Assistance  Patient can return home with the following  A little help with walking and/or transfers;Help with stairs or ramp for entrance;A little help with bathing/dressing/bathroom    Equipment Recommendations Other (comment) (TBD at next venue of care. If to d/c home [facility]  Recommendations for Other Services       Functional Status Assessment Patient has had a recent decline in their functional status and demonstrates the ability to make significant improvements in function in a reasonable and predictable amount of time.      Precautions / Restrictions Precautions Precautions: Fall Restrictions Weight Bearing Restrictions: Yes RLE Weight Bearing: Weight bearing as tolerated      Mobility  Bed Mobility Overal bed mobility: Needs Assistance Bed Mobility: Supine to Sit;Sit to Supine     Supine to sit: Min guard;HOB elevated Sit to supine: Mod assist   General bed mobility comments: Heavy use of bed rails; CGA for safety. MOD A bck to bed to lift RLE and assist with positioning of trunk.    Transfers Overall transfer level: Needs assistance Equipment used: Rolling walker (2 wheels) Transfers: Sit to/from Stand Sit to Stand: Mod assist           General transfer comment: MOD A to lift from EOB x2 reps    Ambulation/Gait Ambulation/Gait assistance: Min guard Gait Distance (Feet): 20 Feet Assistive device: Rolling walker (2 wheels) Gait Pattern/deviations: Step-to pattern;Antalgic Gait velocity: decreased     General Gait Details: CGA for safety. Decreased WB through RLE.  Stairs            Wheelchair Mobility    Modified Rankin (Stroke Patients Only)       Balance Overall balance assessment: Needs assistance Sitting-balance support: No upper extremity supported;Feet supported Sitting balance-Leahy Scale: Good Sitting balance - Comments: no LOB with MMT at EOB   Standing balance support: During functional activity;Bilateral upper extremity supported;Reliant on assistive device for balance Standing balance-Leahy Scale: Poor Standing balance comment: uses RW to steady in standing. Relies on RW during ambulation  Pertinent Vitals/Pain Pain Assessment: 0-10 Pain Score: 7  Pain Descriptors / Indicators: Aching;Discomfort Pain Intervention(s): Limited activity within patient's tolerance;Premedicated before session;Repositioned;Monitored during session;Ice applied    Home Living Family/patient expects to be discharged to:: Private  residence Living Arrangements: Alone   Type of Home: House Home Access: Stairs to enter Entrance Stairs-Rails: Doctor, general practice of Steps: 5   Home Layout: One level Home Equipment: None Additional Comments: Does not have anyone to assist with caregiving at d/c.    Prior Function Prior Level of Function : Independent/Modified Independent                     Hand Dominance        Extremity/Trunk Assessment   Upper Extremity Assessment Upper Extremity Assessment: Overall WFL for tasks assessed    Lower Extremity Assessment Lower Extremity Assessment: Overall WFL for tasks assessed;RLE deficits/detail (5/5 general LLE) RLE Deficits / Details: s/p R TKA       Communication   Communication: No difficulties  Cognition Arousal/Alertness: Awake/alert;Lethargic Behavior During Therapy: WFL for tasks assessed/performed Overall Cognitive Status: Within Functional Limits for tasks assessed                                 General Comments: Awake upon arrival, lethargic by completion (pt suspects due to Roboxin given just before session).        General Comments      Exercises Total Joint Exercises Ankle Circles/Pumps: AROM;Right;Left;20 reps;Supine   Assessment/Plan    PT Assessment Patient needs continued PT services  PT Problem List Decreased strength;Decreased mobility;Decreased range of motion;Decreased activity tolerance;Decreased balance       PT Treatment Interventions Therapeutic activities;DME instruction;Gait training;Therapeutic exercise;Stair training;Balance training;Patient/family education;Functional mobility training;Neuromuscular re-education    PT Goals (Current goals can be found in the Care Plan section)  Acute Rehab PT Goals Patient Stated Goal: to go to rehab to get stronger before going home PT Goal Formulation: With patient Time For Goal Achievement: 05/18/21 Potential to Achieve Goals: Good     Frequency BID     Co-evaluation               AM-PAC PT "6 Clicks" Mobility  Outcome Measure Help needed turning from your back to your side while in a flat bed without using bedrails?: A Little Help needed moving from lying on your back to sitting on the side of a flat bed without using bedrails?: A Little Help needed moving to and from a bed to a chair (including a wheelchair)?: A Little Help needed standing up from a chair using your arms (e.g., wheelchair or bedside chair)?: A Little Help needed to walk in hospital room?: A Little Help needed climbing 3-5 steps with a railing? : A Lot 6 Click Score: 17    End of Session Equipment Utilized During Treatment: Gait belt Activity Tolerance: Patient tolerated treatment well;Patient limited by pain;Patient limited by lethargy Patient left: in bed;with call bell/phone within reach;with nursing/sitter in room Nurse Communication: Mobility status;Precautions PT Visit Diagnosis: Unsteadiness on feet (R26.81);Other abnormalities of gait and mobility (R26.89);Muscle weakness (generalized) (M62.81);Difficulty in walking, not elsewhere classified (R26.2)    Time: 9924-2683 PT Time Calculation (min) (ACUTE ONLY): 35 min   Charges:   PT Evaluation $PT Eval Low Complexity: 1 Low PT Treatments $Gait Training: 8-22 mins $Therapeutic Activity: 8-22 mins        Basilia Jumbo PT, DPT  05/04/21 4:22 PM [phone]

## 2021-05-04 NOTE — Transfer of Care (Signed)
Immediate Anesthesia Transfer of Care Note  Patient: Diane Small  Procedure(s) Performed: TOTAL KNEE ARTHROPLASTY (Right: Knee)  Patient Location: PACU  Anesthesia Type:Spinal  Level of Consciousness: awake, drowsy and patient cooperative  Airway & Oxygen Therapy: Patient Spontanous Breathing  Post-op Assessment: Report given to RN and Post -op Vital signs reviewed and stable  Post vital signs: Reviewed and stable  Last Vitals:  Vitals Value Taken Time  BP 111/58 05/04/21 0924  Temp    Pulse 57 05/04/21 0930  Resp 11 05/04/21 0930  SpO2 99 % 05/04/21 0930  Vitals shown include unvalidated device data.  Last Pain:  Vitals:   05/04/21 0629  TempSrc: Tympanic  PainSc: 10-Worst pain ever      Patients Stated Pain Goal: 3 (05/04/21 9604)  Complications: No notable events documented.

## 2021-05-04 NOTE — NC FL2 (Signed)
[place] MEDICAID FL2 LEVEL OF CARE SCREENING TOOL     IDENTIFICATION  Patient Name: Diane Small Birthdate: 09/05/1955 Sex: female Admission Date (Current Location): 05/04/2021  [facility] [place]   Facility and Address:  [facility], [address], [place], [place] [address]      Provider Number: [id number]  Attending Physician Name and Address:  Kennedy BuckerMenz, Michael, MD  Relative Name and Phone Number:  Anne HahnVenice [phone]    Current Level of Care: Hospital Recommended Level of Care: Skilled Nursing Facility Prior Approval Number:    Date Approved/Denied:   PASRR Number: [id number]  Discharge Plan: SNF    Current Diagnoses: Patient Active Problem List   Diagnosis Date Noted   S/P TKR (total knee replacement) using cement, right 05/04/2021   Rotator cuff tendinitis, right 10/05/2020   Pure hypercholesterolemia 04/16/2017   Hyperlipidemia 04/18/2016   DDD (degenerative disc disease), lumbosacral 10/17/2015   Esophageal reflux 04/18/2015   Essential hypertension 04/18/2015   Annual physical exam 04/18/2015   Routine general medical examination at a health care facility 09/01/2014   Glaucoma 09/01/2014   BMI 45.0-49.9, adult (HCC) 09/01/2014   Arthritis of knee, degenerative 09/09/2013    Orientation RESPIRATION BLADDER Height & Weight     Self, Time, Situation, Place  Normal Continent Weight: 124.3 kg Height:  5\' 3"  (160 cm)  BEHAVIORAL SYMPTOMS/MOOD NEUROLOGICAL BOWEL NUTRITION STATUS      Continent Diet (regular)  AMBULATORY STATUS COMMUNICATION OF NEEDS Skin   Extensive Assist Verbally Normal, Surgical wounds                       Personal Care Assistance Level of Assistance  Bathing, Dressing, Feeding Bathing Assistance: Limited assistance Feeding assistance: Independent Dressing Assistance: Limited assistance     Functional Limitations Info             SPECIAL CARE FACTORS FREQUENCY  PT (By  licensed PT), OT (By licensed OT)     PT Frequency: 5 times per week OT Frequency: 5 times per week            Contractures Contractures Info: Not present    Additional Factors Info  Code Status, Allergies Code Status Info: full code Allergies Info: Amoxicillin-pot Clavulanate           Current Medications (05/04/2021):  This is the current hospital active medication list Current Facility-Administered Medications  Medication Dose Route Frequency Provider Last Rate Last Admin   0.9 %  sodium chloride infusion   Intravenous Continuous Kennedy BuckerMenz, Michael, MD       [START ON 05/05/2021] acetaminophen (TYLENOL) tablet 325-650 mg  325-650 mg Oral Q6H PRN Kennedy BuckerMenz, Michael, MD       albuterol (PROVENTIL) (2.5 MG/3ML) 0.083% nebulizer solution 3 mL  3 mL Inhalation QID Kennedy BuckerMenz, Michael, MD       ALPRAZolam Prudy Feeler(XANAX) tablet 2 mg  2 mg Oral QHS Kennedy BuckerMenz, Michael, MD       alum & mag hydroxide-simeth (MAALOX/MYLANTA) 200-200-20 MG/5ML suspension 30 mL  30 mL Oral Q4H PRN Kennedy BuckerMenz, Michael, MD       aspirin EC tablet 325 mg  325 mg Oral Daily Kennedy BuckerMenz, Michael, MD       azelastine (ASTELIN) 0.1 % nasal spray 1 spray  1 spray Each Nare BID Kennedy BuckerMenz, Michael, MD       bisacodyl (DULCOLAX) suppository 10 mg  10 mg Rectal Daily PRN Kennedy BuckerMenz, Michael, MD  ceFAZolin (ANCEF) IVPB 2g/100 mL premix  2 g Intravenous Q6H Kennedy Bucker, MD 200 mL/hr at 05/04/21 1345 2 g at 05/04/21 1345   diphenhydrAMINE (BENADRYL) 12.5 MG/5ML elixir 12.5-25 mg  12.5-25 mg Oral Q4H PRN Kennedy Bucker, MD       docusate sodium (COLACE) capsule 100 mg  100 mg Oral BID Kennedy Bucker, MD       Melene Muller ON 05/05/2021] enoxaparin (LOVENOX) injection 30 mg  30 mg Subcutaneous Q12H Kennedy Bucker, MD       escitalopram (LEXAPRO) tablet 5 mg  5 mg Oral Daily Kennedy Bucker, MD       hydrochlorothiazide (HYDRODIURIL) tablet 25 mg  25 mg Oral Daily Kennedy Bucker, MD       HYDROcodone-acetaminophen (NORCO) 7.5-325 MG per tablet 1-2 tablet  1-2 tablet Oral Q4H PRN Kennedy Bucker, MD       HYDROcodone-acetaminophen (NORCO/VICODIN) 5-325 MG per tablet 1-2 tablet  1-2 tablet Oral Q4H PRN Kennedy Bucker, MD   1 tablet at 05/04/21 1357   irbesartan (AVAPRO) tablet 150 mg  150 mg Oral Daily Kennedy Bucker, MD       latanoprost (XALATAN) 0.005 % ophthalmic solution 1 drop  1 drop Both Eyes QHS Kennedy Bucker, MD       levocetirizine Elita Boone) tablet 5 mg  5 mg Oral Daily Kennedy Bucker, MD       menthol-cetylpyridinium (CEPACOL) lozenge 3 mg  1 lozenge Oral PRN Kennedy Bucker, MD       Or   phenol (CHLORASEPTIC) mouth spray 1 spray  1 spray Mouth/Throat PRN Kennedy Bucker, MD       methocarbamol (ROBAXIN) tablet 500 mg  500 mg Oral Q6H PRN Kennedy Bucker, MD       Or   methocarbamol (ROBAXIN) 500 mg in dextrose 5 % 50 mL IVPB  500 mg Intravenous Q6H PRN Kennedy Bucker, MD       metoCLOPramide (REGLAN) tablet 5-10 mg  5-10 mg Oral Q8H PRN Kennedy Bucker, MD       Or   metoCLOPramide (REGLAN) injection 5-10 mg  5-10 mg Intravenous Q8H PRN Kennedy Bucker, MD       mometasone-formoterol [facility]) 200-5 MCG/ACT inhaler 2 puff  2 puff Inhalation BID Kennedy Bucker, MD       montelukast (SINGULAIR) tablet 10 mg  10 mg Oral Daily Kennedy Bucker, MD       morphine 4 MG/ML injection 0.52-1 mg  0.52-1 mg Intravenous Q2H PRN Kennedy Bucker, MD   1 mg at 05/04/21 1356   ondansetron (ZOFRAN) 4 MG/2ML injection            ondansetron (ZOFRAN) tablet 4 mg  4 mg Oral Q6H PRN Kennedy Bucker, MD       Or   ondansetron [facility]) injection 4 mg  4 mg Intravenous Q6H PRN Kennedy Bucker, MD       pantoprazole (PROTONIX) EC tablet 40 mg  40 mg Oral Daily Kennedy Bucker, MD       polyethylene glycol (MIRALAX / GLYCOLAX) packet 17 g  17 g Oral Daily PRN Kennedy Bucker, MD       potassium chloride (KLOR-CON) CR tablet 10 mEq  10 mEq Oral BID Kennedy Bucker, MD       sodium phosphate (FLEET) 7-19 GM/118ML enema 1 enema  1 enema Rectal Once PRN Kennedy Bucker, MD       timolol (TIMOPTIC) 0.5 % ophthalmic solution 1  drop  1 drop Both Eyes Daily Kennedy Bucker,  MD       traMADol (ULTRAM) 50 MG tablet            traMADol (ULTRAM) tablet 50 mg  50 mg Oral Q6H Kennedy Bucker, MD   50 mg at 05/04/21 1402   zolpidem (AMBIEN) tablet 5 mg  5 mg Oral QHS PRN Kennedy Bucker, MD         Discharge Medications: Please see discharge summary for a list of discharge medications.  Relevant Imaging Results:  Relevant Lab Results:   Additional Information SS# [id number]  Marlowe Sax, RN
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
## 2021-05-04 NOTE — Op Note (Signed)
05/04/2021  9:27 AM  PATIENT:  Diane Small   MRN: [id number]  PRE-OPERATIVE DIAGNOSIS:  Primary localized osteoarthritis of right knee   POST-OPERATIVE DIAGNOSIS:  Same   PROCEDURE:  Procedure(s): Right TOTAL KNEE ARTHROPLASTY   SURGEON: Leitha Schuller, MD   ASSISTANTS: Cranston Neighbor, PA-C   ANESTHESIA:   spinal   EBL: 100   BLOOD ADMINISTERED:none   DRAINS:  Incisional wound VAC     LOCAL MEDICATIONS USED:  MARCAINE    and OTHER Exparel and morphine   SPECIMEN:  No Specimen   DISPOSITION OF SPECIMEN:  N/A   COUNTS:  YES   TOURNIQUET: 63 minutes at 300 mm Hg   IMPLANTS: Medacta  GMK sphere system with right 2+ femur, right 2 tibia with short stem and 10 mm insert.  Size 1 patella, all components cemented.   DICTATION: Reubin Milan Dictation   patient was brought to the operating room and spinal anesthesia was obtained.  After prepping and draping the right leg in sterile fashion, and after patient identification and timeout procedures were completed, tourniquet was raised  and midline skin incision was made followed by medial parapatellar arthrotomy with severe medial compartment osteoarthritis, severe patellofemoral arthritis and moderate lateral compartment arthritis, partial synovectomy was also carried out.   The ACL and PCL and fat pad were excised along with anterior horns of the meniscus. The proximal tibia cutting guide from  the extra medullary system was applied and the proximal tibia cut carried out.  The distal femoral cut was carried out in a similar fashion using the intramedullary guide at 5 degrees.  Sizing guide placed and sized to a 2+.     The 2+ femoral cutting guide applied with anterior posterior and chamfer cuts made.  The posterior horns of the menisci were removed at this point.   Injection of the above medication was carried out after the femoral and tibial cuts were carried out.  The 2 baseplate trial was placed pinned into position and proximal tibial  preparation carried out with drilling hand reaming and the keel punch followed by placement of the 2+ femur and sizing the tibial insert size 10 millimeter gave the best fit with stability and full extension.  The distal femoral drill holes were made in the notch cut for the trochlear groove was then carried out with trials were then removed the patella was cut using the patellar cutting guide and it sized to a size 1 after drill holes have been made  The knee was irrigated with pulsatile lavage and the bony surfaces dried the tibial component was cemented into place first.  Excess cement was removed and the polyethylene insert placed with a torque screw placed with a torque screwdriver tightened.  The distal femoral component was placed and the knee was held in extension as the patellar button was clamped into place.  After the cement was set, excess cement was removed and the knee was again irrigated thoroughly thoroughly irrigated.  The tourniquet was let down and hemostasis checked with electrocautery. The arthrotomy was repaired with a heavy Quill suture,  followed by 3-0 V lock subcuticular closure, skin staples followed by incisional wound VAC and Polar Care.Marland Kitchen   PLAN OF CARE: Admit for overnight observation   PATIENT DISPOSITION:  PACU - hemodynamically stable.

## 2021-05-04 NOTE — Anesthesia Preprocedure Evaluation (Signed)
Anesthesia Evaluation  Patient identified by MRN, date of birth, ID band Patient awake    Reviewed: Allergy & Precautions, NPO status , Patient's Chart, lab work & pertinent test results  History of Anesthesia Complications (+) PONV and history of anesthetic complications  Airway Mallampati: II  TM Distance: >3 FB Neck ROM: Full    Dental no notable dental hx. (+) Teeth Intact   Pulmonary asthma , sleep apnea , COPD,  COPD inhaler, Patient abstained from smoking.Not current smoker,  Recent asthma diagnosis, took inhaler today, breathing feels at baseline. Never hospitalized for asthma. Has OSA, does not use CPAP. Says it "didn't work"   Pulmonary exam normal breath sounds clear to auscultation       Cardiovascular Exercise Tolerance: Poor METS: 3 - Mets hypertension, Pt. on medications (-) CAD and (-) Past MI (-) dysrhythmias  Rhythm:Regular Rate:Normal - Systolic murmurs    Neuro/Psych  Headaches, PSYCHIATRIC DISORDERS Anxiety Depression    GI/Hepatic GERD  Medicated and Controlled,(+)     (-) substance abuse  ,   Endo/Other  neg diabetesMorbid obesity  Renal/GU negative Renal ROS     Musculoskeletal  (+) Arthritis ,   Abdominal (+) + obese,   Peds  Hematology   Anesthesia Other Findings Past Medical History: No date: Allergy No date: Anxiety No date: Arthritis     Comment:  knees No date: Asthma No date: Complication of anesthesia     Comment:  slow to wake after C-Section No date: COPD (chronic obstructive pulmonary disease) (HCC) No date: Depression No date: Family history of adverse reaction to anesthesia     Comment:  N/V No date: GERD (gastroesophageal reflux disease) No date: Glaucoma No date: H/O cervical fracture     Comment:  age 31, no residual deficits No date: Hypertension No date: Pneumonia No date: PONV (postoperative nausea and vomiting) No date: Sinus headache No date: Sleep  apnea  Reproductive/Obstetrics                             Anesthesia Physical Anesthesia Plan  ASA: 3  Anesthesia Plan: Spinal   Post-op Pain Management: Ofirmev IV (intra-op)   Induction: Intravenous  PONV Risk Score and Plan: 3 and Ondansetron, Dexamethasone, Propofol infusion, TIVA, Midazolam and Aprepitant  Airway Management Planned: Natural Airway  Additional Equipment: None  Intra-op Plan:   Post-operative Plan:   Informed Consent: I have reviewed the patients History and Physical, chart, labs and discussed the procedure including the risks, benefits and alternatives for the proposed anesthesia with the patient or authorized representative who has indicated his/her understanding and acceptance.       Plan Discussed with: CRNA and Surgeon  Anesthesia Plan Comments: (Discussed R/B/A of neuraxial anesthesia technique with patient: - rare risks of spinal/epidural hematoma, nerve damage, infection - Risk of PDPH - Risk of nausea and vomiting - Risk of conversion to general anesthesia and its associated risks, including sore throat, damage to lips/eyes/teeth/oropharynx, and rare risks such as cardiac and respiratory events. - Risk of allergic reactions Patient informed about increased incidence of above perioperative risk due to high BMI. Patient understands.  Discussed the role of CRNA in patient's perioperative care.  Patient voiced understanding.)        Anesthesia Quick Evaluation

## 2021-05-04 NOTE — Anesthesia Procedure Notes (Signed)
Spinal  Patient location during procedure: OR Start time: 05/04/2021 7:20 AM End time: 05/04/2021 7:25 AM Reason for block: surgical anesthesia Staffing Performed: resident/CRNA  Anesthesiologist: Corinda Gubler, MD Resident/CRNA: Henrietta Hoover, CRNA Preanesthetic Checklist Completed: patient identified, IV checked, site marked, risks and benefits discussed, surgical consent, monitors and equipment checked, pre-op evaluation and timeout performed Spinal Block Patient position: sitting Prep: DuraPrep Patient monitoring: heart rate, cardiac monitor, continuous pulse ox and blood pressure Approach: midline Location: L3-4 Injection technique: single-shot Needle Needle type: [place]  Needle gauge: 22 G Needle length: 9 cm Assessment Sensory level: T4 Events: CSF return

## 2021-05-05 DIAGNOSIS — F32A Depression, unspecified: Secondary | ICD-10-CM | POA: Diagnosis present

## 2021-05-05 DIAGNOSIS — Z90710 Acquired absence of both cervix and uterus: Secondary | ICD-10-CM | POA: Diagnosis not present

## 2021-05-05 DIAGNOSIS — E785 Hyperlipidemia, unspecified: Secondary | ICD-10-CM | POA: Diagnosis not present

## 2021-05-05 DIAGNOSIS — G4730 Sleep apnea, unspecified: Secondary | ICD-10-CM | POA: Diagnosis present

## 2021-05-05 DIAGNOSIS — Z20822 Contact with and (suspected) exposure to covid-19: Secondary | ICD-10-CM | POA: Diagnosis present

## 2021-05-05 DIAGNOSIS — E871 Hypo-osmolality and hyponatremia: Secondary | ICD-10-CM | POA: Diagnosis present

## 2021-05-05 DIAGNOSIS — Z419 Encounter for procedure for purposes other than remedying health state, unspecified: Secondary | ICD-10-CM

## 2021-05-05 DIAGNOSIS — Z741 Need for assistance with personal care: Secondary | ICD-10-CM | POA: Diagnosis not present

## 2021-05-05 DIAGNOSIS — Z736 Limitation of activities due to disability: Secondary | ICD-10-CM | POA: Diagnosis not present

## 2021-05-05 DIAGNOSIS — M5137 Other intervertebral disc degeneration, lumbosacral region: Secondary | ICD-10-CM | POA: Diagnosis not present

## 2021-05-05 DIAGNOSIS — M179 Osteoarthritis of knee, unspecified: Secondary | ICD-10-CM | POA: Diagnosis not present

## 2021-05-05 DIAGNOSIS — Z471 Aftercare following joint replacement surgery: Secondary | ICD-10-CM | POA: Diagnosis not present

## 2021-05-05 DIAGNOSIS — M6259 Muscle wasting and atrophy, not elsewhere classified, multiple sites: Secondary | ICD-10-CM | POA: Diagnosis not present

## 2021-05-05 DIAGNOSIS — M1711 Unilateral primary osteoarthritis, right knee: Secondary | ICD-10-CM | POA: Diagnosis present

## 2021-05-05 DIAGNOSIS — Z79899 Other long term (current) drug therapy: Secondary | ICD-10-CM | POA: Diagnosis not present

## 2021-05-05 DIAGNOSIS — M5030 Other cervical disc degeneration, unspecified cervical region: Secondary | ICD-10-CM | POA: Diagnosis present

## 2021-05-05 DIAGNOSIS — J449 Chronic obstructive pulmonary disease, unspecified: Secondary | ICD-10-CM | POA: Diagnosis not present

## 2021-05-05 DIAGNOSIS — M1990 Unspecified osteoarthritis, unspecified site: Secondary | ICD-10-CM | POA: Diagnosis present

## 2021-05-05 DIAGNOSIS — H409 Unspecified glaucoma: Secondary | ICD-10-CM | POA: Diagnosis present

## 2021-05-05 DIAGNOSIS — Z8701 Personal history of pneumonia (recurrent): Secondary | ICD-10-CM | POA: Diagnosis not present

## 2021-05-05 DIAGNOSIS — Z9114 Patient's other noncompliance with medication regimen: Secondary | ICD-10-CM | POA: Diagnosis not present

## 2021-05-05 DIAGNOSIS — I10 Essential (primary) hypertension: Secondary | ICD-10-CM | POA: Diagnosis not present

## 2021-05-05 DIAGNOSIS — E7800 Pure hypercholesterolemia, unspecified: Secondary | ICD-10-CM | POA: Diagnosis present

## 2021-05-05 DIAGNOSIS — Z6841 Body Mass Index (BMI) 40.0 and over, adult: Secondary | ICD-10-CM | POA: Diagnosis not present

## 2021-05-05 DIAGNOSIS — E876 Hypokalemia: Secondary | ICD-10-CM | POA: Diagnosis not present

## 2021-05-05 DIAGNOSIS — Z96659 Presence of unspecified artificial knee joint: Secondary | ICD-10-CM

## 2021-05-05 DIAGNOSIS — K219 Gastro-esophageal reflux disease without esophagitis: Secondary | ICD-10-CM | POA: Diagnosis not present

## 2021-05-05 DIAGNOSIS — F419 Anxiety disorder, unspecified: Secondary | ICD-10-CM | POA: Diagnosis present

## 2021-05-05 DIAGNOSIS — R262 Difficulty in walking, not elsewhere classified: Secondary | ICD-10-CM | POA: Diagnosis not present

## 2021-05-05 DIAGNOSIS — M25561 Pain in right knee: Secondary | ICD-10-CM | POA: Diagnosis present

## 2021-05-05 DIAGNOSIS — Z96652 Presence of left artificial knee joint: Secondary | ICD-10-CM | POA: Diagnosis present

## 2021-05-05 DIAGNOSIS — T39396A Underdosing of other nonsteroidal anti-inflammatory drugs [NSAID], initial encounter: Secondary | ICD-10-CM | POA: Diagnosis present

## 2021-05-05 DIAGNOSIS — Z91128 Patient's intentional underdosing of medication regimen for other reason: Secondary | ICD-10-CM | POA: Diagnosis not present

## 2021-05-05 DIAGNOSIS — Z7982 Long term (current) use of aspirin: Secondary | ICD-10-CM | POA: Diagnosis not present

## 2021-05-05 DIAGNOSIS — Z880 Allergy status to penicillin: Secondary | ICD-10-CM | POA: Diagnosis not present

## 2021-05-05 DIAGNOSIS — Z96651 Presence of right artificial knee joint: Secondary | ICD-10-CM | POA: Diagnosis not present

## 2021-05-05 LAB — BASIC METABOLIC PANEL
Anion gap: 6 (ref 5–15)
BUN: 9 mg/dL (ref 8–23)
CO2: 26 mmol/L (ref 22–32)
Calcium: 8.1 mg/dL — ABNORMAL LOW (ref 8.9–10.3)
Chloride: 96 mmol/L — ABNORMAL LOW (ref 98–111)
Creatinine, Ser: 0.73 mg/dL (ref 0.44–1.00)
GFR, Estimated: >60 mL/min (ref >60)
Glucose, Bld: 125 mg/dL — ABNORMAL HIGH (ref 70–99)
Potassium: 4.3 mmol/L (ref 3.5–5.1)
Sodium: 128 mmol/L — ABNORMAL LOW (ref 135–145)

## 2021-05-05 LAB — CBC
HCT: 37.1 % (ref 36.0–46.0)
Hemoglobin: 12 g/dL (ref 12.0–15.0)
MCH: 27.9 pg (ref 26.0–34.0)
MCHC: 32.3 g/dL (ref 30.0–36.0)
MCV: 86.3 fL (ref 80.0–100.0)
Platelets: 384 10*3/uL (ref 150–400)
RBC: 4.3 MIL/uL (ref 3.87–5.11)
RDW: 13.9 % (ref 11.5–15.5)
WBC: 12.9 10*3/uL — ABNORMAL HIGH (ref 4.0–10.5)
nRBC: 0 % (ref 0.0–0.2)

## 2021-05-05 MED ORDER — DOCUSATE SODIUM 100 MG PO CAPS
ORAL_CAPSULE | ORAL | Status: AC
  Administered 2021-05-05: 100 mg via ORAL
  Filled 2021-05-05: qty 1

## 2021-05-05 MED ORDER — MORPHINE SULFATE (PF) 4 MG/ML IV SOLN
INTRAVENOUS | Status: AC
  Administered 2021-05-05: 1 mg via INTRAVENOUS
  Filled 2021-05-05: qty 1

## 2021-05-05 MED ORDER — ASPIRIN EC 325 MG PO TBEC
DELAYED_RELEASE_TABLET | ORAL | Status: AC
  Filled 2021-05-05: qty 1

## 2021-05-05 MED ORDER — METHOCARBAMOL 500 MG PO TABS
ORAL_TABLET | ORAL | Status: AC
  Administered 2021-05-05: 500 mg via ORAL
  Filled 2021-05-05: qty 1

## 2021-05-05 MED ORDER — HYDROCODONE-ACETAMINOPHEN 7.5-325 MG PO TABS
ORAL_TABLET | ORAL | Status: AC
  Administered 2021-05-05: 2 via ORAL
  Filled 2021-05-05: qty 2

## 2021-05-05 MED ORDER — METHOCARBAMOL 500 MG PO TABS
ORAL_TABLET | ORAL | Status: AC
  Filled 2021-05-05: qty 1

## 2021-05-05 MED ORDER — TRAMADOL HCL 50 MG PO TABS
ORAL_TABLET | ORAL | Status: AC
  Filled 2021-05-05: qty 1

## 2021-05-05 MED ORDER — HYDROCODONE-ACETAMINOPHEN 5-325 MG PO TABS
ORAL_TABLET | ORAL | Status: AC
  Administered 2021-05-05: 2 via ORAL
  Filled 2021-05-05: qty 2

## 2021-05-05 MED ORDER — ALBUTEROL SULFATE (2.5 MG/3ML) 0.083% IN NEBU
INHALATION_SOLUTION | RESPIRATORY_TRACT | Status: AC
  Administered 2021-05-05: 3 mL via RESPIRATORY_TRACT
  Filled 2021-05-05: qty 3

## 2021-05-05 MED ORDER — MORPHINE SULFATE (PF) 4 MG/ML IV SOLN
INTRAVENOUS | Status: AC
  Filled 2021-05-05: qty 1

## 2021-05-05 MED ORDER — HYDROCODONE-ACETAMINOPHEN 7.5-325 MG PO TABS
ORAL_TABLET | ORAL | Status: AC
  Filled 2021-05-05: qty 2

## 2021-05-05 MED ORDER — TRAMADOL HCL 50 MG PO TABS
ORAL_TABLET | ORAL | Status: AC
  Administered 2021-05-05: 50 mg via ORAL
  Filled 2021-05-05: qty 1

## 2021-05-05 NOTE — TOC Progression Note (Signed)
Transition of Care [facility]) - Progression Note    Patient Details  Name: Diane Small MRN: [id number] Date of Birth: 04-17-1956  Transition of [facility]) CM/SW [place], RN Phone Number: 05/05/2021, 1:28 PM  Clinical Narrative:   Reviewed the bed offers with the patient, she chose [facility], I notified Tammy and Otila Kluver at [facility], Insurance process started and pending         Expected Discharge Plan and Services                                                 Social Determinants of Health (SDOH) Interventions    Readmission Risk Interventions No flowsheet data found.

## 2021-05-05 NOTE — Anesthesia Postprocedure Evaluation (Signed)
Anesthesia Post Note  Patient: Diane Small  Procedure(s) Performed: TOTAL KNEE ARTHROPLASTY (Right: Knee)  Patient location during evaluation: Nursing Unit Anesthesia Type: Spinal Level of consciousness: awake, awake and alert and oriented Pain management: pain level controlled Vital Signs Assessment: post-procedure vital signs reviewed and stable Respiratory status: spontaneous breathing, respiratory function stable and nonlabored ventilation Cardiovascular status: blood pressure returned to baseline and stable Postop Assessment: no headache and no backache Anesthetic complications: no   No notable events documented.   Last Vitals:  Vitals:   05/05/21 0400 05/05/21 0800  BP: 138/63 (!) 143/64  Pulse: 65 68  Resp: 16 18  Temp: (!) 36 C (!) 36.2 C  SpO2: 95% 100%    Last Pain:  Vitals:   05/05/21 0717  TempSrc:   PainSc: 5                  [employer]

## 2021-05-05 NOTE — Evaluation (Signed)
Occupational Therapy Evaluation Patient Details Name: Diane Small MRN: [id number] DOB: 11/06/1955 Today's Date: 05/05/2021   History of Present Illness 66 y.o. female s/p R TKA.   Clinical Impression   Pt seen for OT evaluation this date, POD#1 from above surgery. Pt was independent in all ADLs prior to surgery, however occasionally using a SPC for ambulation. Pt is eager to return to PLOF with less pain and improved safety and independence. Pt performs sit<>stand X 4 with Min/Mod A, requires Mod A for bed mobility, with assistance for lifting, lowering, and laterally moving R LE. She requires Mod A for [facility] and reports 8/10 pain. Pt instructed in polar care mgt, falls prevention strategies, home/routines modifications, DME/AE for LB bathing and dressing tasks, and compression stocking mgt. Pt would benefit from skilled OT services during hospitalization, including additional instruction in dressing techniques with or without assistive devices for dressing and bathing skills to support recall and carryover prior to discharge and ultimately to maximize safety, independence, and minimize falls risk and caregiver burden. Given that pt is currently far from her baseline level of fxl mobility, has a high level of pain, and had no caregiver assistance at home, recommend DC to STR, followed by [facility] upon return home.     Recommendations for follow up therapy are one component of a multi-disciplinary discharge planning process, led by the attending physician.  Recommendations may be updated based on patient status, additional functional criteria and insurance authorization.   Follow Up Recommendations  Skilled nursing-short term rehab (<3 hours/day)    Assistance Recommended at Discharge Frequent or constant Supervision/Assistance  Patient can return home with the following A little help with walking and/or transfers;A little help with bathing/dressing/bathroom    Functional Status  Assessment  Patient has had a recent decline in their functional status and demonstrates the ability to make significant improvements in function in a reasonable and predictable amount of time.  Equipment Recommendations  None recommended by OT    Recommendations for Other Services       Precautions / Restrictions Restrictions Weight Bearing Restrictions: Yes RLE Weight Bearing: Weight bearing as tolerated      Mobility Bed Mobility Overal bed mobility: Needs Assistance Bed Mobility: Supine to Sit;Sit to Supine     Supine to sit: Min assist Sit to supine: Mod assist        Transfers Overall transfer level: Needs assistance Equipment used: Rolling walker (2 wheels) Transfers: Sit to/from Stand Sit to Stand: Min assist;Mod assist                  Balance Overall balance assessment: Needs assistance Sitting-balance support: No upper extremity supported;Feet supported Sitting balance-Leahy Scale: Good     Standing balance support: During functional activity;Bilateral upper extremity supported;Reliant on assistive device for balance Standing balance-Leahy Scale: Fair                             ADL either performed or assessed with clinical judgement   ADL Overall ADL's : Needs assistance/impaired                         Toilet Transfer: Moderate assistance;BSC/3in1;Standard walker   Toileting- Clothing Manipulation and Hygiene: Modified independent               Vision Patient Visual Report: No change from baseline       Perception  Praxis      Pertinent Vitals/Pain Pain Assessment: 0-10 Pain Score: 8  Pain Descriptors / Indicators: Aching;Discomfort;Grimacing;Guarding Pain Intervention(s): Limited activity within patient's tolerance;Repositioned;Monitored during session     Hand Dominance     Extremity/Trunk Assessment Upper Extremity Assessment Upper Extremity Assessment: Overall WFL for tasks assessed   Lower  Extremity Assessment Lower Extremity Assessment: Overall WFL for tasks assessed;RLE deficits/detail RLE Deficits / Details: s/p R TKA       Communication Communication Communication: No difficulties   Cognition Arousal/Alertness: Awake/alert Behavior During Therapy: WFL for tasks assessed/performed Overall Cognitive Status: Within Functional Limits for tasks assessed                                 General Comments: A&O x 4     General Comments       Exercises Other Exercises Other Exercises: Educ re: polar care mgmt, AE for LB dressing & bathing, falls mgmt, WB restrictions   Shoulder Instructions      Home Living Family/patient expects to be discharged to:: Private residence Living Arrangements: Alone Available Help at Discharge: Other (Comment) (states she has no one to assist her at home) Type of Home: House Home Access: Stairs to enter [employer] of Steps: 4   Home Layout: One level     Bathroom Shower/Tub: Producer, television/film/video: Handicapped height     Home Equipment: Cane - single point          Prior Functioning/Environment Prior Level of Function : Independent/Modified Independent                        OT Problem List: Decreased strength;Impaired balance (sitting and/or standing);Pain;Decreased range of motion;Decreased activity tolerance      OT Treatment/Interventions: Self-care/ADL training;DME and/or AE instruction;Therapeutic activities;Balance training;Therapeutic exercise;Energy conservation;Patient/family education    OT Goals(Current goals can be found in the care plan section) Acute Rehab OT Goals Patient Stated Goal: to be pain free OT Goal Formulation: With patient Time For Goal Achievement: 05/19/21 Potential to Achieve Goals: Good ADL Goals Pt Will Perform Lower Body Dressing: with modified independence;with adaptive equipment;sit to/from stand;sitting/lateral leans Pt Will Transfer to  Toilet: with modified independence;stand pivot transfer (using LRAD) Pt/caregiver will Perform Home Exercise Program: Increased ROM;Increased strength;Independently  OT Frequency: Min 2X/week    Co-evaluation              AM-PAC OT "6 Clicks" Daily Activity     Outcome Measure Help from another person eating meals?: None Help from another person taking care of personal grooming?: None Help from another person toileting, which includes using toliet, bedpan, or urinal?: A Lot Help from another person bathing (including washing, rinsing, drying)?: A Lot Help from another person to put on and taking off regular upper body clothing?: None Help from another person to put on and taking off regular lower body clothing?: A Lot 6 Click Score: 18   End of Session Equipment Utilized During Treatment: Rolling walker (2 wheels)  Activity Tolerance: Patient tolerated treatment well Patient left: in bed;with call bell/phone within reach  OT Visit Diagnosis: Unsteadiness on feet (R26.81);Muscle weakness (generalized) (M62.81);Other abnormalities of gait and mobility (R26.89);Pain Pain - Right/Left: Right Pain - part of body: Knee                Time: 2951-8841 OT Time Calculation (min): 35 min Charges:  OT Evaluation $  OT Eval Low Complexity: 1 Low OT Treatments $Self Care/Home Management : 23-37 mins Latina Craver, PhD, MS, OTR/L 05/05/21, 12:10 PM

## 2021-05-05 NOTE — Progress Notes (Signed)
Physical Therapy Treatment Patient Details Name: Diane Small MRN: [id number] DOB: 1955/08/29 Today's Date: 05/05/2021   History of Present Illness 66 y.o. female s/p R TKA.    PT Comments    Pt received supine in bed, reporting pain and stating she cannot get out of bed right now. PT attempted to see pt earlier in morning however OT was in room. Pt was agreeable to supine therapeutic exercises in packet. Pt performed 15 reps of each. PT has coordinated with RN and pain med schedule to return for afternoon session. Will plan to ambulate this pm. Would benefit from skilled PT to address above deficits and promote optimal return to PLOF.   Recommendations for follow up therapy are one component of a multi-disciplinary discharge planning process, led by the attending physician.  Recommendations may be updated based on patient status, additional functional criteria and insurance authorization.  Follow Up Recommendations  Skilled nursing-short term rehab (<3 hours/day)     Assistance Recommended at Discharge Intermittent Supervision/Assistance  Patient can return home with the following A little help with walking and/or transfers;Help with stairs or ramp for entrance;A little help with bathing/dressing/bathroom;Assist for transportation   Equipment Recommendations  Other (comment) (TBD at next venue of care. If to d/c home will need RW and [facility].)    Recommendations for Other Services       Precautions / Restrictions Precautions Precautions: Fall Restrictions Weight Bearing Restrictions: Yes RLE Weight Bearing: Weight bearing as tolerated     Mobility  Bed Mobility Overal bed mobility: Needs Assistance Bed Mobility: Supine to Sit;Sit to Supine     Supine to sit: Min assist Sit to supine: Mod assist        Transfers Overall transfer level: Needs assistance Equipment used: Rolling walker (2 wheels) Transfers: Sit to/from Stand Sit to Stand: Min assist;Mod assist                 Ambulation/Gait                   Stairs             Wheelchair Mobility    Modified Rankin (Stroke Patients Only)       Balance Overall balance assessment: Needs assistance Sitting-balance support: No upper extremity supported;Feet supported Sitting balance-Leahy Scale: Good     Standing balance support: During functional activity;Bilateral upper extremity supported;Reliant on assistive device for balance Standing balance-Leahy Scale: Fair                              Cognition Arousal/Alertness: Awake/alert Behavior During Therapy: WFL for tasks assessed/performed Overall Cognitive Status: Within Functional Limits for tasks assessed                                 General Comments: A&O x 4        Exercises Total Joint Exercises Ankle Circles/Pumps: AROM;Right;20 reps;Supine;Strengthening Quad Sets: AROM;Strengthening;Right;15 reps;Supine Short Arc Quad: AAROM;Strengthening;Right;15 reps;Supine Heel Slides: AAROM;Strengthening;Right;15 reps;Supine Hip ABduction/ADduction: AAROM;Right;Strengthening;15 reps;Supine Straight Leg Raises: AAROM;Strengthening;Right;15 reps;Supine Other Exercises Other Exercises: Educ re: polar care mgmt, AE for LB dressing & bathing, falls mgmt, WB restrictions    General Comments        Pertinent Vitals/Pain Pain Assessment: Faces Pain Score: 8  Faces Pain Scale: Hurts whole lot Pain Location: R knee Pain Descriptors / Indicators: Aching;Discomfort;Grimacing;Guarding Pain Intervention(s): Limited activity  within patient's tolerance;Monitored during session;Repositioned    Home Living Family/patient expects to be discharged to:: Private residence Living Arrangements: Alone Available Help at Discharge: Other (Comment) (states she has no one to assist her at home) Type of Home: House Home Access: Stairs to enter   [employer] of Steps: 4   Home Layout: One  level Home Equipment: Cane - single point      Prior Function            PT Goals (current goals can now be found in the care plan section) Acute Rehab PT Goals Patient Stated Goal: to go to rehab to get stronger before going home PT Goal Formulation: With patient Time For Goal Achievement: 05/18/21 Potential to Achieve Goals: Good    Frequency    BID      PT Plan      Co-evaluation              AM-PAC PT "6 Clicks" Mobility   Outcome Measure  Help needed turning from your back to your side while in a flat bed without using bedrails?: A Little Help needed moving from lying on your back to sitting on the side of a flat bed without using bedrails?: A Little Help needed moving to and from a bed to a chair (including a wheelchair)?: A Little Help needed standing up from a chair using your arms (e.g., wheelchair or bedside chair)?: A Little Help needed to walk in hospital room?: A Little Help needed climbing 3-5 steps with a railing? : A Lot 6 Click Score: 17    End of Session Equipment Utilized During Treatment: Gait belt Activity Tolerance: Patient tolerated treatment well;Patient limited by pain;Patient limited by lethargy Patient left: in bed;with call bell/phone within reach;with nursing/sitter in room Nurse Communication: Mobility status;Precautions PT Visit Diagnosis: Unsteadiness on feet (R26.81);Other abnormalities of gait and mobility (R26.89);Muscle weakness (generalized) (M62.81);Difficulty in walking, not elsewhere classified (R26.2)     Time: 4709-6283 PT Time Calculation (min) (ACUTE ONLY): 21 min  Charges:  $Therapeutic Exercise: 8-22 mins                     Basilia Jumbo PT, DPT 05/05/21 1:15 PM [phone]

## 2021-05-05 NOTE — Plan of Care (Signed)

## 2021-05-05 NOTE — Progress Notes (Signed)
° °  Subjective: 1 Day Post-Op Procedure(s) (LRB): TOTAL KNEE ARTHROPLASTY (Right) Patient reports pain as mild and moderate.   Patient is well, and has had no acute complaints or problems Denies any CP, SOB, ABD pain. We will continue therapy today.  Plan is to go Skilled nursing facility after hospital stay.  Objective: Vital signs in last 24 hours: Temp:  [96.8 F (36 C)-97.5 F (36.4 C)] 97.2 F (36.2 C) (01/06 1204) Pulse Rate:  [53-68] 57 (01/06 1204) Resp:  [0-18] 18 (01/06 1204) BP: (80-143)/(58-88) 125/70 (01/06 1204) SpO2:  [95 %-100 %] 99 % (01/06 1204)  Intake/Output from previous day: 01/05 0701 - 01/06 0700 In: 3251 [P.O.:1360; I.V.:1601.3; IV Piggyback:289.7] Out: 1050 [Urine:1025; Blood:25] Intake/Output this shift: No intake/output data recorded.  Recent Labs    05/04/21 0640 05/04/21 1420 05/05/21 0447  HGB 13.6 12.3 12.0   Recent Labs    05/04/21 1420 05/05/21 0447  WBC 16.7* 12.9*  RBC 4.42 4.30  HCT 39.3 37.1  PLT 369 384   Recent Labs    05/04/21 0640 05/04/21 1420 05/05/21 0447  NA 142  --  128*  K 3.7  --  4.3  CL 106  --  96*  CO2  --   --  26  BUN 4*  --  9  CREATININE 0.80 0.81 0.73  GLUCOSE 97  --  125*  CALCIUM  --   --  8.1*   No results for input(s): LABPT, INR in the last 72 hours.  EXAM General - Patient is Alert, Appropriate, and Oriented Extremity - Neurovascular intact Sensation intact distally Intact pulses distally Dorsiflexion/Plantar flexion intact Dressing - dressing C/D/I and no drainage, provena intact with out drainage Motor Function - intact, moving foot and toes well on exam.   Past Medical History:  Diagnosis Date   Allergy    Anxiety    Arthritis    knees   Asthma    Complication of anesthesia    slow to wake after C-Section   COPD (chronic obstructive pulmonary disease) (HCC)    Depression    Family history of adverse reaction to anesthesia    N/V   GERD (gastroesophageal reflux disease)     Glaucoma    H/O cervical fracture    age 32, no residual deficits   Hypertension    Pneumonia    PONV (postoperative nausea and vomiting)    Sinus headache    Sleep apnea     Assessment/Plan:   1 Day Post-Op Procedure(s) (LRB): TOTAL KNEE ARTHROPLASTY (Right) Principal Problem:   S/P TKR (total knee replacement) using cement, right Active Problems:   Surgery, elective  Estimated body mass index is 48.54 kg/m as calculated from the following:   Height as of this encounter: 5\' 3"  (1.6 m).   Weight as of this encounter: 124.3 kg. Advance diet Up with therapy Work on BM VSS Hyponatremia - Na 128. Dc IV fluids.  Recheck labs tomorrow CM to assist with discharge to SNF, patient lives at home alone.  DVT Prophylaxis - Lovenox, TED hose, and SCDs Weight-Bearing as tolerated to right leg   T. , PA-C [facility] 05/05/2021, 12:10 PM

## 2021-05-05 NOTE — Progress Notes (Signed)
Physical Therapy Treatment Patient Details Name: Diane Small MRN: [id number] DOB: 1955-09-19 Today's Date: 05/05/2021   History of Present Illness 66 y.o. female s/p R TKA.    PT Comments    Pt received supine in bed. She asks why her knee hurts "so bad." PT coordinated with pain meds, arriving 1 hour after administered. Pt progressed to OOB mobility. Sup>sit with SUP and increased time, PT present for safety and she scooted her hips towards the EOB. All OOB mobility requiring CGA with assist to steady on occasion. She progressed ambulation distance to 41ft utilizing same antalgic gait pattern as previous session. Once back to her room, she sat down in the recliner. Once set up in room, pt states she needs to use the restroom (after PT had asked twice earlier in session). PT set up [facility] in room as she does not have a toilet and assisted pt with stand pivot to commode. Pt returned to [place] notified on mobility, pain and void. Due to absent caregiver support, PT continues to rec SNF as safe d/c plan. Would benefit from skilled PT to address above deficits and promote optimal return to PLOF.   Recommendations for follow up therapy are one component of a multi-disciplinary discharge planning process, led by the attending physician.  Recommendations may be updated based on patient status, additional functional criteria and insurance authorization.  Follow Up Recommendations  Skilled nursing-short term rehab (<3 hours/day)     Assistance Recommended at Discharge Intermittent Supervision/Assistance  Patient can return home with the following A little help with walking and/or transfers;Help with stairs or ramp for entrance;A little help with bathing/dressing/bathroom;Assist for transportation   Equipment Recommendations  Other (comment) (TBD at next venue of care. If to d/c home will need RW and [facility].)    Recommendations for Other Services       Precautions / Restrictions  Precautions Precautions: Fall Restrictions Weight Bearing Restrictions: Yes RLE Weight Bearing: Weight bearing as tolerated     Mobility  Bed Mobility Overal bed mobility: Needs Assistance Bed Mobility: Supine to Sit     Supine to sit: Supervision;HOB elevated     General bed mobility comments: Heavy use of bed rails. Increased time and effort    Transfers Overall transfer level: Needs assistance Equipment used: Rolling walker (2 wheels) Transfers: Sit to/from Stand;Bed to chair/wheelchair/BSC Sit to Stand: Min guard Stand pivot transfers: Min guard         General transfer comment: CGA using RW to stand from elevated surface - frequent VC on hand positioning. Stand pivot recliner<>BSC - CGA to steady and assist with RW management in tight space.    Ambulation/Gait Ambulation/Gait assistance: Min guard Gait Distance (Feet): 40 Feet Assistive device: Rolling walker (2 wheels) Gait Pattern/deviations: Step-to pattern;Antalgic Gait velocity: decreased     General Gait Details: CGA to steady. VC to remain close to and inside RW. Pt reporting 10/10 pain. Decreased safety awareness - PT encouraging pt to turn around, pt responding with "let's see what happens" as she continues walking forward.   Stairs             Wheelchair Mobility    Modified Rankin (Stroke Patients Only)       Balance Overall balance assessment: Needs assistance Sitting-balance support: No upper extremity supported;Feet supported Sitting balance-Leahy Scale: Good     Standing balance support: During functional activity;Bilateral upper extremity supported;Reliant on assistive device for balance Standing balance-Leahy Scale: Fair Standing balance comment: Relies on RW  during ambulation. Uses single UE on RW to steady during standing pericare. CGA at all times.                            Cognition Arousal/Alertness: Awake/alert Behavior During Therapy: WFL for tasks  assessed/performed Overall Cognitive Status: Within Functional Limits for tasks assessed                                          Exercises Total Joint Exercises Ankle Circles/Pumps: AROM;Right;20 reps;Supine;Strengthening Quad Sets: AROM;Strengthening;Right;15 reps;Supine Short Arc Quad: AAROM;Strengthening;Right;15 reps;Supine Heel Slides: AAROM;Strengthening;Right;15 reps;Supine Hip ABduction/ADduction: AAROM;Right;Strengthening;15 reps;Supine Straight Leg Raises: AAROM;Strengthening;Right;15 reps;Supine Long Arc Quad: AAROM;Left;Strengthening;15 reps;Seated Knee Flexion: AROM;Strengthening;Left;15 reps;Seated    General Comments        Pertinent Vitals/Pain Pain Assessment: 0-10 Pain Score: 10-Worst pain ever Faces Pain Scale: Hurts whole lot Pain Location: R knee during ambulation Pain Descriptors / Indicators: Aching;Discomfort;Grimacing;Guarding Pain Intervention(s): Limited activity within patient's tolerance;Monitored during session;Premedicated before session;Repositioned;Ice applied    Home Living                          Prior Function            PT Goals (current goals can now be found in the care plan section) Acute Rehab PT Goals Patient Stated Goal: to go to rehab to get stronger before going home PT Goal Formulation: With patient Time For Goal Achievement: 05/18/21 Potential to Achieve Goals: Good    Frequency    BID      PT Plan      Co-evaluation              AM-PAC PT "6 Clicks" Mobility   Outcome Measure  Help needed turning from your back to your side while in a flat bed without using bedrails?: A Little Help needed moving from lying on your back to sitting on the side of a flat bed without using bedrails?: A Little Help needed moving to and from a bed to a chair (including a wheelchair)?: A Little Help needed standing up from a chair using your arms (e.g., wheelchair or bedside chair)?: A Little Help  needed to walk in hospital room?: A Little Help needed climbing 3-5 steps with a railing? : A Lot 6 Click Score: 17    End of Session Equipment Utilized During Treatment: Gait belt Activity Tolerance: Patient limited by pain Patient left: with call bell/phone within reach;in chair Nurse Communication: Mobility status;Precautions PT Visit Diagnosis: Unsteadiness on feet (R26.81);Other abnormalities of gait and mobility (R26.89);Muscle weakness (generalized) (M62.81);Difficulty in walking, not elsewhere classified (R26.2)     Time: [id number] PT Time Calculation (min) (ACUTE ONLY): 32 min  Charges:  $Gait Training: 8-22 mins $Therapeutic Exercise: 8-22 mins $Therapeutic Activity: 8-22 mins                     Patrina Levering PT, DPT 05/05/21 5:06 PM [id number]

## 2021-05-06 LAB — BASIC METABOLIC PANEL WITH GFR
Anion gap: 6 (ref 5–15)
BUN: 13 mg/dL (ref 8–23)
CO2: 27 mmol/L (ref 22–32)
Calcium: 8.3 mg/dL — ABNORMAL LOW (ref 8.9–10.3)
Chloride: 90 mmol/L — ABNORMAL LOW (ref 98–111)
Creatinine, Ser: 0.92 mg/dL (ref 0.44–1.00)
GFR, Estimated: >60 mL/min
Glucose, Bld: 107 mg/dL — ABNORMAL HIGH (ref 70–99)
Potassium: 4.1 mmol/L (ref 3.5–5.1)
Sodium: 123 mmol/L — ABNORMAL LOW (ref 135–145)

## 2021-05-06 LAB — CBC
HCT: 33.4 % — ABNORMAL LOW (ref 36.0–46.0)
Hemoglobin: 11.1 g/dL — ABNORMAL LOW (ref 12.0–15.0)
MCH: 28.3 pg (ref 26.0–34.0)
MCHC: 33.2 g/dL (ref 30.0–36.0)
MCV: 85.2 fL (ref 80.0–100.0)
Platelets: 382 K/uL (ref 150–400)
RBC: 3.92 MIL/uL (ref 3.87–5.11)
RDW: 13.7 % (ref 11.5–15.5)
WBC: 11.9 K/uL — ABNORMAL HIGH (ref 4.0–10.5)
nRBC: 0 % (ref 0.0–0.2)

## 2021-05-06 MED ORDER — ALBUTEROL SULFATE (2.5 MG/3ML) 0.083% IN NEBU: 3.0000 mL | INHALATION_SOLUTION | RESPIRATORY_TRACT | Status: DC | PRN

## 2021-05-06 MED ORDER — IRBESARTAN 150 MG PO TABS: 150.0000 mg | ORAL_TABLET | Freq: Every day | ORAL | Status: DC

## 2021-05-06 MED ORDER — HYDROCHLOROTHIAZIDE 25 MG PO TABS: 25.0000 mg | ORAL_TABLET | Freq: Every day | ORAL | Status: DC

## 2021-05-06 NOTE — TOC Progression Note (Signed)
Transition of Care [facility]) - Progression Note    Patient Details  Name: Diane Small MRN: [id number] Date of Birth: [birth year]  [facility] CM/SW Contact  Liliana Cline, LCSW Phone Number: 05/06/2021, 10:33 AM  Clinical Narrative:   [facility]. Insurance auth still pending at this time for [facility].         Expected Discharge Plan and Services                                                 Social Determinants of Health (SDOH) Interventions    Readmission Risk Interventions No flowsheet data found.

## 2021-05-06 NOTE — Progress Notes (Signed)
Physical Therapy Treatment Patient Details Name: Diane Small MRN: [id number] DOB: [birth year] Today's Date: 05/06/2021   History of Present Illness 66 y.o. female s/p R TKA.    PT Comments    Pt was sitting EOB unsupervised upon entry. She has polar acre wrap around leg however not running. Author educated pt on importance of elevating LEs and keeping ice for swelling/promoting healing. Pt is much more swollen this afternoon versus AM session. She endorses more severe pain this afternoon as well. RN aware of request for pain medication. Pt was agreeable to session however answers her cell phone 4 times during very limited session. She ambulated around bed to recliner and was repositioned with towel roll under heel and polar care reapplied. Unwilling to perform ROM and there ex due to pain. Acute PT will continue to follow and progress as able per current POC. SNF at DC most appropriate due to lack of assistance at home.    Recommendations for follow up therapy are one component of a multi-disciplinary discharge planning process, led by the attending physician.  Recommendations may be updated based on patient status, additional functional criteria and insurance authorization.  Follow Up Recommendations  Skilled nursing-short term rehab (<3 hours/day)     Assistance Recommended at Discharge Intermittent Supervision/Assistance  Patient can return home with the following A little help with walking and/or transfers;Help with stairs or ramp for entrance;A little help with bathing/dressing/bathroom;Assist for transportation   Equipment Recommendations  Other (comment) (defer to next level of care)       Precautions / Restrictions Precautions Precautions: Fall Restrictions Weight Bearing Restrictions: Yes RLE Weight Bearing: Weight bearing as tolerated     Mobility  Bed Mobility  General bed mobility comments: Pt was seated EOB upon arriving without polar care running. noted to have  some increased swelling. Author recommended pt reapply ice and elevate LEs after session.    Transfers Overall transfer level: Needs assistance Equipment used: Rolling walker (2 wheels) Transfers: Sit to/from Stand Sit to Stand: Min guard;Min assist      General transfer comment: pt was able to stand at EOB with min assist + vcs for technique and sequencing    Ambulation/Gait Ambulation/Gait assistance: Min guard Gait Distance (Feet): 15 Feet Assistive device: Rolling walker (2 wheels) Gait Pattern/deviations: Antalgic;Step-to pattern;Trunk flexed Gait velocity: decreased     General Gait Details: pt was unable to tolerate gait well this afternoon due to pain/increased swelling. slow antalgic step to pattern      Balance Overall balance assessment: Needs assistance Sitting-balance support: No upper extremity supported;Feet supported Sitting balance-Leahy Scale: Good     Standing balance support: During functional activity;Bilateral upper extremity supported;Reliant on assistive device for balance Standing balance-Leahy Scale: Fair Standing balance comment: pt relys on RW for all standing activity       Cognition Arousal/Alertness: Awake/alert Behavior During Therapy: WFL for tasks assessed/performed Overall Cognitive Status: Within Functional Limits for tasks assessed      General Comments: A&O x 4               Pertinent Vitals/Pain Pain Assessment: 0-10 Pain Score: 8  Faces Pain Scale: Hurts whole lot Pain Location: R knee during ambulation Pain Descriptors / Indicators: Aching;Discomfort;Grimacing;Guarding Pain Intervention(s): Limited activity within patient's tolerance;Monitored during session;Patient requesting pain meds-RN notified;Ice applied     PT Goals (current goals can now be found in the care plan section) Acute Rehab PT Goals Patient Stated Goal: rehab then home Progress towards PT goals:  Progressing toward goals    Frequency     BID      PT Plan Current plan remains appropriate       AM-PAC PT "6 Clicks" Mobility   Outcome Measure  Help needed turning from your back to your side while in a flat bed without using bedrails?: A Little Help needed moving from lying on your back to sitting on the side of a flat bed without using bedrails?: A Little Help needed moving to and from a bed to a chair (including a wheelchair)?: A Little Help needed standing up from a chair using your arms (e.g., wheelchair or bedside chair)?: A Little Help needed to walk in hospital room?: A Little Help needed climbing 3-5 steps with a railing? : A Lot 6 Click Score: 17    End of Session   Activity Tolerance: Patient tolerated treatment well;Patient limited by pain Patient left: in chair;with call bell/phone within reach;with chair alarm set Nurse Communication: Mobility status;Precautions PT Visit Diagnosis: Unsteadiness on feet (R26.81);Other abnormalities of gait and mobility (R26.89);Muscle weakness (generalized) (M62.81);Difficulty in walking, not elsewhere classified (R26.2)     Time: 4332-9518 PT Time Calculation (min) (ACUTE ONLY): 20 min  Charges:  $Gait Training: 8-22 mins                    Jetta Lout PTA 05/06/21, 5:22 PM

## 2021-05-06 NOTE — Progress Notes (Signed)
Physical Therapy Treatment Patient Details Name: Diane Small MRN: [id number] DOB: February 03, 1956 Today's Date: 05/06/2021   History of Present Illness 66 y.o. female s/p R TKA.    PT Comments    Pt was long sitting in bed upon arriving with RN in room issuing medication. Pt is A and O x 4 and agreeable to session. She was able to exit L side of bed with min assist + increased time (HOB elevated). Stood EOB 2 x prior to ambulating into hallway with RW. She has very slow antalgic gait pattern however no LOB observed. Once returned to room and repositioned up in recliner, pt struggles to stay awake. Unable to perform there ex at this time. Will return this afternoon to progress ROM and strength.   Recommendations for follow up therapy are one component of a multi-disciplinary discharge planning process, led by the attending physician.  Recommendations may be updated based on patient status, additional functional criteria and insurance authorization.  Follow Up Recommendations  Skilled nursing-short term rehab (<3 hours/day) (pt does not have any assistance at home)     Assistance Recommended at Discharge Intermittent Supervision/Assistance  Patient can return home with the following A little help with walking and/or transfers;Help with stairs or ramp for entrance;A little help with bathing/dressing/bathroom;Assist for transportation   Equipment Recommendations   (defer to next level of care)       Precautions / Restrictions Precautions Precautions: Fall Restrictions Weight Bearing Restrictions: Yes RLE Weight Bearing: Weight bearing as tolerated     Mobility  Bed Mobility Overal bed mobility: Needs Assistance Bed Mobility: Supine to Sit     Supine to sit: Min assist;HOB elevated     General bed mobility comments: Increased time to perform. min assist today with increased time + vcs    Transfers Overall transfer level: Needs assistance Equipment used: Rolling walker (2  wheels) Transfers: Sit to/from Stand Sit to Stand: Min guard;Min assist           General transfer comment: CGA form slightly elevated bed height. min assist form lower recliner height    Ambulation/Gait Ambulation/Gait assistance: Min guard Gait Distance (Feet): 75 Feet Assistive device: Rolling walker (2 wheels) Gait Pattern/deviations: Antalgic;Step-to pattern;Trunk flexed Gait velocity: decreased     General Gait Details: pt has very slow antalgic gait pattern. Vcs throughout for posture correction. Overall tolerated gait well but is fatigued with limited distance.      Balance Overall balance assessment: Needs assistance Sitting-balance support: No upper extremity supported;Feet supported Sitting balance-Leahy Scale: Good     Standing balance support: During functional activity;Bilateral upper extremity supported;Reliant on assistive device for balance Standing balance-Leahy Scale: Fair Standing balance comment: Relies on RW during ambulation. Uses single UE on RW to steady during standing pericare. CGA at all times.         Cognition Arousal/Alertness: Awake/alert Behavior During Therapy: WFL for tasks assessed/performed Overall Cognitive Status: Within Functional Limits for tasks assessed        General Comments: A&O x 4               Pertinent Vitals/Pain Pain Assessment: 0-10 Pain Score: 5  Pain Location: R knee during ambulation Pain Descriptors / Indicators: Aching;Discomfort;Grimacing;Guarding Pain Intervention(s): Limited activity within patient's tolerance;Monitored during session;Premedicated before session;Repositioned;Ice applied     PT Goals (current goals can now be found in the care plan section) Acute Rehab PT Goals Patient Stated Goal: rehab then home Progress towards PT goals: Progressing toward goals  Frequency    BID      PT Plan Current plan remains appropriate       AM-PAC PT "6 Clicks" Mobility   Outcome  Measure  Help needed turning from your back to your side while in a flat bed without using bedrails?: A Little Help needed moving from lying on your back to sitting on the side of a flat bed without using bedrails?: A Little Help needed moving to and from a bed to a chair (including a wheelchair)?: A Little Help needed standing up from a chair using your arms (e.g., wheelchair or bedside chair)?: A Little Help needed to walk in hospital room?: A Little Help needed climbing 3-5 steps with a railing? : A Lot 6 Click Score: 17    End of Session   Activity Tolerance: Patient tolerated treatment well;Patient limited by lethargy;Patient limited by fatigue Patient left: in chair;with call bell/phone within reach;with chair alarm set Nurse Communication: Mobility status;Precautions PT Visit Diagnosis: Unsteadiness on feet (R26.81);Other abnormalities of gait and mobility (R26.89);Muscle weakness (generalized) (M62.81);Difficulty in walking, not elsewhere classified (R26.2)     Time: 1010-1034 PT Time Calculation (min) (ACUTE ONLY): 24 min  Charges:  $Gait Training: 8-22 mins $Therapeutic Activity: 8-22 mins                     Jetta Lout PTA 05/06/21, 10:42 AM

## 2021-05-06 NOTE — Progress Notes (Signed)
° °  Subjective: 2 Days Post-Op Procedure(s) (LRB): TOTAL KNEE ARTHROPLASTY (Right) Patient reports pain as moderate, improving Patient is well, and has had no acute complaints or problems Denies any CP, SOB, ABD pain. We will continue therapy today.  Plan is to go Skilled nursing facility after hospital stay.  Objective: Vital signs in last 24 hours: Temp:  [97.2 F (36.2 C)-98.4 F (36.9 C)] 98.4 F (36.9 C) (01/07 0808) Pulse Rate:  [57-71] 71 (01/07 0808) Resp:  [15-18] 17 (01/07 0808) BP: (115-135)/(50-90) 115/50 (01/07 0808) SpO2:  [97 %-100 %] 99 % (01/07 0808)  Intake/Output from previous day: 01/06 0701 - 01/07 0700 In: -  Out: 300 [Urine:300] Intake/Output this shift: No intake/output data recorded.  Recent Labs    05/04/21 0640 05/04/21 1420 05/05/21 0447 05/06/21 0618  HGB 13.6 12.3 12.0 11.1*   Recent Labs    05/05/21 0447 05/06/21 0618  WBC 12.9* 11.9*  RBC 4.30 3.92  HCT 37.1 33.4*  PLT 384 382   Recent Labs    05/05/21 0447 05/06/21 0618  NA 128* 123*  K 4.3 4.1  CL 96* 90*  CO2 26 27  BUN 9 13  CREATININE 0.73 0.92  GLUCOSE 125* 107*  CALCIUM 8.1* 8.3*   No results for input(s): LABPT, INR in the last 72 hours.  EXAM General - Patient is Alert, Appropriate, and Oriented Extremity - Neurovascular intact Sensation intact distally Intact pulses distally Dorsiflexion/Plantar flexion intact Dressing - dressing C/D/I and no drainage, provena intact with out drainage Motor Function - intact, moving foot and toes well on exam.   Past Medical History:  Diagnosis Date   Allergy    Anxiety    Arthritis    knees   Asthma    Complication of anesthesia    slow to wake after C-Section   COPD (chronic obstructive pulmonary disease) (HCC)    Depression    Family history of adverse reaction to anesthesia    N/V   GERD (gastroesophageal reflux disease)    Glaucoma    H/O cervical fracture    age 43, no residual deficits   Hypertension     Pneumonia    PONV (postoperative nausea and vomiting)    Sinus headache    Sleep apnea     Assessment/Plan:   2 Days Post-Op Procedure(s) (LRB): TOTAL KNEE ARTHROPLASTY (Right) Principal Problem:   S/P TKR (total knee replacement) using cement, right Active Problems:   Surgery, elective   S/P TKR (total knee replacement)  Estimated body mass index is 48.54 kg/m as calculated from the following:   Height as of this encounter: 5\' 3"  (1.6 m).   Weight as of this encounter: 124.3 kg. Advance diet Up with therapy Work on BM VSS Hyponatremia - Na 123.  Will hold Avapro and hydrochlorothiazide today, blood pressure soft.  We will also place on fluid restrictions.  Recheck sodium tomorrow Recheck labs tomorrow CM to assist with discharge to SNF.  [facility]ticipate [facility] Monday.   DVT Prophylaxis - Lovenox, TED hose, and SCDs Weight-Bearing as tolerated to right leg   T. Rachelle Hora, PA-C [facility] 05/06/2021, 8:16 AM

## 2021-05-07 LAB — BASIC METABOLIC PANEL
Anion gap: 5 (ref 5–15)
BUN: 13 mg/dL (ref 8–23)
CO2: 29 mmol/L (ref 22–32)
Calcium: 8.4 mg/dL — ABNORMAL LOW (ref 8.9–10.3)
Chloride: 95 mmol/L — ABNORMAL LOW (ref 98–111)
Creatinine, Ser: 0.85 mg/dL (ref 0.44–1.00)
GFR, Estimated: >60 mL/min (ref >60)
Glucose, Bld: 98 mg/dL (ref 70–99)
Potassium: 4.1 mmol/L (ref 3.5–5.1)
Sodium: 129 mmol/L — ABNORMAL LOW (ref 135–145)

## 2021-05-07 MED ORDER — IRBESARTAN 150 MG PO TABS
150.0000 mg | ORAL_TABLET | Freq: Every day | ORAL | Status: DC
  Administered 2021-05-08: 150 mg via ORAL
  Filled 2021-05-07: qty 1

## 2021-05-07 MED ORDER — HYDROCHLOROTHIAZIDE 25 MG PO TABS
25.0000 mg | ORAL_TABLET | Freq: Every day | ORAL | Status: DC
  Administered 2021-05-08: 25 mg via ORAL
  Filled 2021-05-07: qty 1

## 2021-05-07 MED ORDER — MAGNESIUM HYDROXIDE 400 MG/5ML PO SUSP
30.0000 mL | Freq: Once | ORAL | Status: AC
  Administered 2021-05-07: 30 mL via ORAL
  Filled 2021-05-07: qty 30

## 2021-05-07 NOTE — Progress Notes (Signed)
° °  Subjective: 3 Days Post-Op Procedure(s) (LRB): TOTAL KNEE ARTHROPLASTY (Right) Patient reports pain as moderate, improving Patient is well, and has had no acute complaints or problems Denies any CP, SOB, ABD pain. We will continue therapy today.  Plan is to go Skilled nursing facility after hospital stay.  Objective: Vital signs in last 24 hours: Temp:  [98 F (36.7 C)-98.7 F (37.1 C)] 98 F (36.7 C) (01/08 0803) Pulse Rate:  [69-88] 85 (01/08 0803) Resp:  [16-19] 19 (01/08 0803) BP: (102-148)/(64-83) 125/64 (01/08 0803) SpO2:  [96 %-100 %] 97 % (01/08 0803)  Intake/Output from previous day: 01/07 0701 - 01/08 0700 In: 350 [P.O.:350] Out: 450 [Urine:450] Intake/Output this shift: No intake/output data recorded.  Recent Labs    05/04/21 1420 05/05/21 0447 05/06/21 0618  HGB 12.3 12.0 11.1*   Recent Labs    05/05/21 0447 05/06/21 0618  WBC 12.9* 11.9*  RBC 4.30 3.92  HCT 37.1 33.4*  PLT 384 382   Recent Labs    05/06/21 0618 05/07/21 0548  NA 123* 129*  K 4.1 4.1  CL 90* 95*  CO2 27 29  BUN 13 13  CREATININE 0.92 0.85  GLUCOSE 107* 98  CALCIUM 8.3* 8.4*   No results for input(s): LABPT, INR in the last 72 hours.  EXAM General - Patient is Alert, Appropriate, and Oriented Extremity - Neurovascular intact Sensation intact distally Intact pulses distally Dorsiflexion/Plantar flexion intact Dressing - dressing C/D/I and no drainage, provena intact with out drainage Motor Function - intact, moving foot and toes well on exam.   Past Medical History:  Diagnosis Date   Allergy    Anxiety    Arthritis    knees   Asthma    Complication of anesthesia    slow to wake after C-Section   COPD (chronic obstructive pulmonary disease) (HCC)    Depression    Family history of adverse reaction to anesthesia    N/V   GERD (gastroesophageal reflux disease)    Glaucoma    H/O cervical fracture    age 52, no residual deficits   Hypertension    Pneumonia     PONV (postoperative nausea and vomiting)    Sinus headache    Sleep apnea     Assessment/Plan:   3 Days Post-Op Procedure(s) (LRB): TOTAL KNEE ARTHROPLASTY (Right) Principal Problem:   S/P TKR (total knee replacement) using cement, right Active Problems:   Surgery, elective   S/P TKR (total knee replacement)  Estimated body mass index is 48.54 kg/m as calculated from the following:   Height as of this encounter: 5\' 3"  (1.6 m).   Weight as of this encounter: 124.3 kg. Advance diet Up with therapy Work on BM VSS Hyponatremia - Na 129.  Continue with fluid restriction and hold BP meds. Recheck Na in am CM to assist with discharge to SNF.  Anticipate [facility] Monday.   DVT Prophylaxis - Lovenox, TED hose, and SCDs Weight-Bearing as tolerated to right leg   T. Tuesday, PA-C [facility] 05/07/2021, 8:42 AM

## 2021-05-07 NOTE — Plan of Care (Signed)

## 2021-05-07 NOTE — TOC Progression Note (Addendum)
Transition of Care [facility]) - Progression Note    Patient Details  Name: FREDRICK DRAY MRN: [id number] Date of Birth: 1955/12/15  Transition of Care [facility]) CM/SW Contact  Margarito Liner, LCSW Phone Number: 05/07/2021, 8:48 AM  Clinical Narrative:   Insurance authorization for SNF still pending.  3:10 pm: Auth still pending.  Expected Discharge Plan and Services                                                 Social Determinants of Health (SDOH) Interventions    Readmission Risk Interventions No flowsheet data found.

## 2021-05-07 NOTE — Plan of Care (Signed)
Patient sleeping between care. Aox4. Pain controlled. Discussed fluid restriction with patient. No new changes in assessment. Call bell within reach.  PLAN OF CARE ONGOING Problem: Education: Goal: Knowledge of General Education information will improve Description: Including pain rating scale, medication(s)/side effects and non-pharmacologic comfort measures Outcome: Progressing   Problem: Health Behavior/Discharge Planning: Goal: Ability to manage health-related needs will improve Outcome: Progressing   Problem: Clinical Measurements: Goal: Ability to maintain clinical measurements within normal limits will improve Outcome: Progressing Goal: Will remain free from infection Outcome: Progressing Goal: Diagnostic test results will improve Outcome: Progressing Goal: Respiratory complications will improve Outcome: Progressing Goal: Cardiovascular complication will be avoided Outcome: Progressing   Problem: Activity: Goal: Risk for activity intolerance will decrease Outcome: Progressing   Problem: Nutrition: Goal: Adequate nutrition will be maintained Outcome: Progressing   Problem: Coping: Goal: Level of anxiety will decrease Outcome: Progressing   Problem: Elimination: Goal: Will not experience complications related to bowel motility Outcome: Progressing Goal: Will not experience complications related to urinary retention Outcome: Progressing   Problem: Pain Managment: Goal: General experience of comfort will improve Outcome: Progressing   Problem: Safety: Goal: Ability to remain free from injury will improve Outcome: Progressing   Problem: Skin Integrity: Goal: Risk for impaired skin integrity will decrease Outcome: Progressing

## 2021-05-07 NOTE — Progress Notes (Signed)
Physical Therapy Treatment Patient Details Name: Diane Small MRN: [id number] DOB: [birth year] Today's Date: 05/07/2021   History of Present Illness 65 y.o. female s/p R TKA.    PT Comments    Patient making progress towards meeting PT goals. She increased ambulation distance to 163ft with rolling walker with occasional cues for safety and technique. Patient performed exercises for strengthening and increased ROM in seated position after walking. Although she is making progress, SNF is still recommended at discharge as patient lives at home alone. PT will continue to follow to maximize independence and facilitate return to prior level of function.    Recommendations for follow up therapy are one component of a multi-disciplinary discharge planning process, led by the attending physician.  Recommendations may be updated based on patient status, additional functional criteria and insurance authorization.  Follow Up Recommendations  Skilled nursing-short term rehab (<3 hours/day)     Assistance Recommended at Discharge Intermittent Supervision/Assistance  Patient can return home with the following A little help with walking and/or transfers;Help with stairs or ramp for entrance;A little help with bathing/dressing/bathroom;Assist for transportation   Equipment Recommendations   (to be determined at next level of care)    Recommendations for Other Services       Precautions / Restrictions Precautions Precautions: Fall Restrictions Weight Bearing Restrictions: Yes RLE Weight Bearing: Weight bearing as tolerated     Mobility  Bed Mobility Overal bed mobility: Needs Assistance Bed Mobility: Supine to Sit     Supine to sit: [facility] elevated;Min assist     General bed mobility comments: assistance for RLE support. verbal cues for technique. increased time and effort required for mobility efforts    Transfers Overall transfer level: Needs assistance Equipment used: Rolling  walker (2 wheels) Transfers: Sit to/from Stand Sit to Stand: Min guard           General transfer comment: verbal cues for technique. standing performed from bed and from toilet    Ambulation/Gait Ambulation/Gait assistance: Min guard Gait Distance (Feet): 180 Feet Assistive device: Rolling walker (2 wheels) Gait Pattern/deviations: Step-to pattern;Step-through pattern;Trunk flexed Gait velocity: decreased     General Gait Details: patient does report she feels her knee buckles during ambulation, however this is not observed with ambulation. patient increased gait distance this session with improvement in gait pattern with cues and increased ambulation distance. verbal cues also to keep rolling walker closer to base of support   Stairs             Wheelchair Mobility    Modified Rankin (Stroke Patients Only)       Balance Overall balance assessment: Needs assistance Sitting-balance support: No upper extremity supported;Feet supported Sitting balance-Leahy Scale: Good     Standing balance support: During functional activity;Bilateral upper extremity supported;Reliant on assistive device for balance Standing balance-Leahy Scale: Fair Standing balance comment: reliant on rolling walker for UE support in standing                            Cognition Arousal/Alertness: Awake/alert Behavior During Therapy: WFL for tasks assessed/performed Overall Cognitive Status: Within Functional Limits for tasks assessed                                 General Comments: patient able to follow single step commands consistently        Exercises Total Joint Exercises Ankle Circles/Pumps:  AROM;Strengthening;Both;10 reps;Seated Quad Sets: AROM;Strengthening;Both;10 reps;Seated Hip ABduction/ADduction: AROM;Strengthening;Right;5 reps;Seated Straight Leg Raises: AAROM;Strengthening;Right;10 reps;Seated Long Arc Quad: AAROM;Strengthening;Right;10  reps;Seated Goniometric ROM: right knee 11-84 degrees measured in sitting position    General Comments General comments (skin integrity, edema, etc.): polar care reapplied at end of session to right knee. SCD applied to left leg at end of session      Pertinent Vitals/Pain Pain Assessment: 0-10 Pain Score: 7  Pain Location: R knee during ambulation Pain Descriptors / Indicators: Sore Pain Intervention(s): Limited activity within patient's tolerance;Patient requesting pain meds-RN notified (polar care re-applied at end of session to right knee. alerted nuse via secure chat that patient requesting pain meds at end of session)    Home Living                          Prior Function            PT Goals (current goals can now be found in the care plan section) Acute Rehab PT Goals Patient Stated Goal: to go to rehab PT Goal Formulation: With patient Time For Goal Achievement: 05/18/21 Potential to Achieve Goals: Good Progress towards PT goals: Progressing toward goals    Frequency    BID      PT Plan Current plan remains appropriate    Co-evaluation              AM-PAC PT "6 Clicks" Mobility   Outcome Measure  Help needed turning from your back to your side while in a flat bed without using bedrails?: A Little Help needed moving from lying on your back to sitting on the side of a flat bed without using bedrails?: A Little Help needed moving to and from a bed to a chair (including a wheelchair)?: A Little Help needed standing up from a chair using your arms (e.g., wheelchair or bedside chair)?: A Little Help needed to walk in hospital room?: A Little Help needed climbing 3-5 steps with a railing? : A Lot 6 Click Score: 17    End of Session Equipment Utilized During Treatment: Gait belt Activity Tolerance: Patient tolerated treatment well Patient left: in chair;with call bell/phone within reach Nurse Communication: Patient requests pain meds PT Visit  Diagnosis: Unsteadiness on feet (R26.81);Other abnormalities of gait and mobility (R26.89);Muscle weakness (generalized) (M62.81);Difficulty in walking, not elsewhere classified (R26.2)     Time: 1330-1416 PT Time Calculation (min) (ACUTE ONLY): 46 min  Charges:  $Gait Training: 8-22 mins $Therapeutic Exercise: 8-22 mins $Therapeutic Activity: 8-22 mins                     Donna Bernard, PT, MPT    Ina Homes 05/07/2021, 2:42 PM

## 2021-05-08 DIAGNOSIS — M179 Osteoarthritis of knee, unspecified: Secondary | ICD-10-CM | POA: Diagnosis not present

## 2021-05-08 DIAGNOSIS — K219 Gastro-esophageal reflux disease without esophagitis: Secondary | ICD-10-CM | POA: Diagnosis not present

## 2021-05-08 DIAGNOSIS — J45909 Unspecified asthma, uncomplicated: Secondary | ICD-10-CM | POA: Diagnosis not present

## 2021-05-08 DIAGNOSIS — Z741 Need for assistance with personal care: Secondary | ICD-10-CM | POA: Diagnosis not present

## 2021-05-08 DIAGNOSIS — R52 Pain, unspecified: Secondary | ICD-10-CM | POA: Diagnosis not present

## 2021-05-08 DIAGNOSIS — J449 Chronic obstructive pulmonary disease, unspecified: Secondary | ICD-10-CM | POA: Diagnosis not present

## 2021-05-08 DIAGNOSIS — I480 Paroxysmal atrial fibrillation: Secondary | ICD-10-CM | POA: Diagnosis not present

## 2021-05-08 DIAGNOSIS — N390 Urinary tract infection, site not specified: Secondary | ICD-10-CM | POA: Diagnosis not present

## 2021-05-08 DIAGNOSIS — E785 Hyperlipidemia, unspecified: Secondary | ICD-10-CM | POA: Diagnosis not present

## 2021-05-08 DIAGNOSIS — F32A Depression, unspecified: Secondary | ICD-10-CM | POA: Diagnosis not present

## 2021-05-08 DIAGNOSIS — M5137 Other intervertebral disc degeneration, lumbosacral region: Secondary | ICD-10-CM | POA: Diagnosis not present

## 2021-05-08 DIAGNOSIS — R350 Frequency of micturition: Secondary | ICD-10-CM | POA: Diagnosis not present

## 2021-05-08 DIAGNOSIS — R262 Difficulty in walking, not elsewhere classified: Secondary | ICD-10-CM | POA: Diagnosis not present

## 2021-05-08 DIAGNOSIS — M6259 Muscle wasting and atrophy, not elsewhere classified, multiple sites: Secondary | ICD-10-CM | POA: Diagnosis not present

## 2021-05-08 DIAGNOSIS — Z471 Aftercare following joint replacement surgery: Secondary | ICD-10-CM | POA: Diagnosis not present

## 2021-05-08 DIAGNOSIS — Z96651 Presence of right artificial knee joint: Secondary | ICD-10-CM | POA: Diagnosis not present

## 2021-05-08 DIAGNOSIS — R197 Diarrhea, unspecified: Secondary | ICD-10-CM | POA: Diagnosis not present

## 2021-05-08 DIAGNOSIS — Z736 Limitation of activities due to disability: Secondary | ICD-10-CM | POA: Diagnosis not present

## 2021-05-08 DIAGNOSIS — E876 Hypokalemia: Secondary | ICD-10-CM | POA: Diagnosis not present

## 2021-05-08 DIAGNOSIS — I10 Essential (primary) hypertension: Secondary | ICD-10-CM | POA: Diagnosis not present

## 2021-05-08 LAB — BASIC METABOLIC PANEL
Anion gap: 7 (ref 5–15)
BUN: 8 mg/dL (ref 8–23)
CO2: 30 mmol/L (ref 22–32)
Calcium: 8.2 mg/dL — ABNORMAL LOW (ref 8.9–10.3)
Chloride: 96 mmol/L — ABNORMAL LOW (ref 98–111)
Creatinine, Ser: 0.71 mg/dL (ref 0.44–1.00)
GFR, Estimated: >60 mL/min (ref >60)
Glucose, Bld: 115 mg/dL — ABNORMAL HIGH (ref 70–99)
Potassium: 4.2 mmol/L (ref 3.5–5.1)
Sodium: 133 mmol/L — ABNORMAL LOW (ref 135–145)

## 2021-05-08 LAB — RESP PANEL BY RT-PCR (FLU A&B, COVID) ARPGX2
Influenza A by PCR: NEGATIVE
Influenza B by PCR: NEGATIVE
SARS Coronavirus 2 by RT PCR: NEGATIVE

## 2021-05-08 MED ORDER — TRAMADOL HCL 50 MG PO TABS: 50.0000 mg | ORAL_TABLET | Freq: Four times a day (QID) | ORAL | 0 refills | Status: DC | PRN

## 2021-05-08 MED ORDER — ENOXAPARIN SODIUM 40 MG/0.4ML IJ SOSY: 40.0000 mg | PREFILLED_SYRINGE | INTRAMUSCULAR | 0 refills | Status: DC

## 2021-05-08 MED ORDER — DOCUSATE SODIUM 100 MG PO CAPS: 100.0000 mg | ORAL_CAPSULE | Freq: Two times a day (BID) | ORAL | 0 refills | Status: DC

## 2021-05-08 MED ORDER — METHOCARBAMOL 500 MG PO TABS: 500.0000 mg | ORAL_TABLET | Freq: Four times a day (QID) | ORAL | 0 refills | Status: DC | PRN

## 2021-05-08 MED ORDER — HYDROCODONE-ACETAMINOPHEN 5-325 MG PO TABS: 1.0000 | ORAL_TABLET | ORAL | 0 refills | Status: DC | PRN

## 2021-05-08 NOTE — Discharge Instructions (Signed)

## 2021-05-08 NOTE — Discharge Summary (Signed)
Physician Discharge Summary  Patient ID: Diane Small MRN: [id number] DOB/AGE: 66/06/1955 66 y.o.  Admit date: 05/04/2021 Discharge date: 05/08/2021  Admission Diagnoses:  S/P TKR (total knee replacement) using cement, right [Z96.651] Surgery, elective [Z41.9] S/P TKR (total knee replacement) [Z96.659]   Discharge Diagnoses: Patient Active Problem List   Diagnosis Date Noted   Surgery, elective 05/05/2021   S/P TKR (total knee replacement) 05/05/2021   S/P TKR (total knee replacement) using cement, right 05/04/2021   Rotator cuff tendinitis, right 10/05/2020   Pure hypercholesterolemia 04/16/2017   Hyperlipidemia 04/18/2016   DDD (degenerative disc disease), lumbosacral 10/17/2015   Esophageal reflux 04/18/2015   Essential hypertension 04/18/2015   Annual physical exam 04/18/2015   Routine general medical examination at a health care facility 09/01/2014   Glaucoma 09/01/2014   BMI 45.0-49.9, adult (HCC) 09/01/2014   Arthritis of knee, degenerative 09/09/2013    Past Medical History:  Diagnosis Date   Allergy    Anxiety    Arthritis    knees   Asthma    Complication of anesthesia    slow to wake after C-Section   COPD (chronic obstructive pulmonary disease) (HCC)    Depression    Family history of adverse reaction to anesthesia    N/V   GERD (gastroesophageal reflux disease)    Glaucoma    H/O cervical fracture    age 66, no residual deficits   Hypertension    Pneumonia    PONV (postoperative nausea and vomiting)    Sinus headache    Sleep apnea      Transfusion: none   Consultants (if any):   Discharged Condition: Improved  Hospital Course: Diane Small is an 66 y.o. female who was admitted 05/04/2021 with a diagnosis of S/P TKR (total knee replacement) using cement, right and went to the operating room on 05/04/2021 and underwent the above named procedures.    Surgeries: Procedure(s): TOTAL KNEE ARTHROPLASTY on 05/04/2021 Patient tolerated the  surgery well. Taken to PACU where she was stabilized and then transferred to the orthopedic floor.  Started on Lovenox 30 mg q 12 hrs. Foot pumps applied bilaterally at 80 mm. Heels elevated on bed with rolled towels. No evidence of DVT. Negative Homan. Physical therapy started on day #1 for gait training and transfer. OT started day #1 for ADL and assisted devices.  Patient's foley was d/c on day #1. Patient's IV was d/c on day #2.  On post op day #3 patient was stable and ready for discharge to SNF.    She was given perioperative antibiotics:  Anti-infectives (From admission, onward)    Start     Dose/Rate Route Frequency Ordered Stop   05/04/21 1330  ceFAZolin (ANCEF) IVPB 2g/100 mL premix        2 g 200 mL/hr over 30 Minutes Intravenous Every 6 hours 05/04/21 1007 05/04/21 1958   05/04/21 0615  ceFAZolin (ANCEF) IVPB 3g/100 mL premix        3 g 200 mL/hr over 30 Minutes Intravenous On call to O.R. 05/04/21 [id number] 05/04/21 0737     .  She was given sequential compression devices, early ambulation, and Lovenox TEDs for DVT prophylaxis.  She benefited maximally from the hospital stay and there were no complications.    Recent vital signs:  Vitals:   05/07/21 2035 05/08/21 0525  BP: (!) 142/59 (!) 126/55  Pulse: 92 91  Resp: 20   Temp: 99 F (37.2 C) 98.7 F (37.1 C)  SpO2:  100% 94%    Recent laboratory studies:  Lab Results  Component Value Date   HGB 11.1 (L) 05/06/2021   HGB 12.0 05/05/2021   HGB 12.3 05/04/2021   Lab Results  Component Value Date   WBC 11.9 (H) 05/06/2021   PLT 382 05/06/2021   No results found for: INR Lab Results  Component Value Date   NA 129 (L) 05/07/2021   K 4.1 05/07/2021   CL 95 (L) 05/07/2021   CO2 29 05/07/2021   BUN 13 05/07/2021   CREATININE 0.85 05/07/2021   GLUCOSE 98 05/07/2021    Discharge Medications:   Allergies as of 05/08/2021       Reactions   Amoxicillin-pot Clavulanate Diarrhea   TOLERATED CEFAZOLIN         Medication List     STOP taking these medications    aspirin 325 MG EC tablet       TAKE these medications    acetaminophen 650 MG CR tablet Commonly known as: TYLENOL Take 1,300 mg by mouth every 8 (eight) hours as needed for pain.   albuterol 108 (90 Base) MCG/ACT inhaler Commonly known as: VENTOLIN HFA INHALE 1-2 PUFFS INTO THE LUNGS EVERY 6 (SIX) HOURS AS NEEDED FOR WHEEZING OR SHORTNESS OF BREATH.   ALPRAZolam 1 MG tablet Commonly known as: XANAX Take 2 mg by mouth at bedtime. psych   azelastine 0.1 % nasal spray Commonly known as: ASTELIN Place 1 spray into both nostrils 2 (two) times daily. Dr Andee Poles   budesonide-formoterol 160-4.5 MCG/ACT inhaler Commonly known as: SYMBICORT Inhale 2 puffs into the lungs daily as needed (asthma). Dr Andee Poles   dexlansoprazole 60 MG capsule Commonly known as: DEXILANT TAKE 1 CAPSULE (60 MG TOTAL) BY MOUTH DAILY. DR Andee Poles   docusate sodium 100 MG capsule Commonly known as: COLACE Take 1 capsule (100 mg total) by mouth 2 (two) times daily.   enoxaparin 40 MG/0.4ML injection Commonly known as: LOVENOX Inject 0.4 mLs (40 mg total) into the skin daily for 14 days.   escitalopram 5 MG tablet Commonly known as: LEXAPRO Take 5 mg by mouth daily. psych   FISH OIL PO Take 1 capsule by mouth daily.   hydrochlorothiazide 25 MG tablet Commonly known as: HYDRODIURIL TAKE 1 TABLET BY MOUTH EVERY DAY   HYDROcodone-acetaminophen 5-325 MG tablet Commonly known as: NORCO/VICODIN Take 1-2 tablets by mouth every 4 (four) hours as needed for moderate pain (pain score 4-6).   latanoprost 0.005 % ophthalmic solution Commonly known as: XALATAN Place 1 drop into both eyes at bedtime. Dr Inez Pilgrim   levocetirizine 5 MG tablet Commonly known as: XYZAL Take 5 mg by mouth daily.   methocarbamol 500 MG tablet Commonly known as: ROBAXIN Take 1 tablet (500 mg total) by mouth every 6 (six) hours as needed for muscle spasms.    montelukast 10 MG tablet Commonly known as: SINGULAIR TAKE 1 TABLET BY MOUTH EVERY DAY   potassium chloride 10 MEQ tablet Commonly known as: KLOR-CON Take 10 mEq by mouth 2 (two) times daily.   telmisartan 40 MG tablet Commonly known as: MICARDIS TAKE 1 TABLET BY MOUTH EVERY DAY   timolol 0.5 % ophthalmic solution Commonly known as: TIMOPTIC Place 1 drop into both eyes daily. Dr Inez Pilgrim   traMADol 50 MG tablet Commonly known as: ULTRAM Take 1 tablet (50 mg total) by mouth every 6 (six) hours as needed. What changed: reasons to take this  Durable Medical Equipment  (From admission, onward)           Start     Ordered   05/04/21 4967  DME Walker rolling  Once       Question Answer Comment  Walker: With 5 Inch Wheels   Patient needs a walker to treat with the following condition S/P TKR (total knee replacement) using cement, right      05/04/21 0937   05/04/21 0938  DME 3 n 1  Once        05/04/21 5916   05/04/21 0938  DME Bedside commode  Once       Question:  Patient needs a bedside commode to treat with the following condition  Answer:  S/P TKR (total knee replacement) using cement, right   05/04/21 0937            Diagnostic Studies: DG Knee 1-2 Views Right  Result Date: 05/04/2021 CLINICAL DATA:  Status post right knee arthroplasty. EXAM: RIGHT KNEE - 1-2 VIEW COMPARISON:  Sep 06, 2009. FINDINGS: The right femoral and tibial components are well situated. Expected postoperative changes are noted in the soft tissues anteriorly. IMPRESSION: Status post right total knee arthroplasty. Electronically Signed   By: Lupita Raider M.D.   On: 05/04/2021 10:01    Disposition:      Contact information for follow-up providers     Evon Slack, PA-C Follow up in 2 week(s).   Specialties: Orthopedic Surgery, Emergency Medicine Contact information: [address] [place] [address] [phone]              Contact  information for after-discharge care     Destination     [facility] Preferred SNF .   Service: Skilled Nursing Contact information: [address] [place] [address] [phone]                      Signed: [facility] 05/08/2021, 8:04 AM

## 2021-05-08 NOTE — Plan of Care (Signed)

## 2021-05-08 NOTE — Progress Notes (Signed)
° °  Subjective: 4 Days Post-Op Procedure(s) (LRB): TOTAL KNEE ARTHROPLASTY (Right) Patient reports pain as moderate, improving Patient is well, and has had no acute complaints or problems Denies any CP, SOB, ABD pain. We will continue therapy today.  Plan is to go Skilled nursing facility after hospital stay.  Objective: Vital signs in last 24 hours: Temp:  [97.9 F (36.6 C)-99 F (37.2 C)] 98.7 F (37.1 C) (01/09 0525) Pulse Rate:  [91-94] 91 (01/09 0525) Resp:  [18-20] 20 (01/08 2035) BP: (126-142)/(55-75) 126/55 (01/09 0525) SpO2:  [94 %-100 %] 94 % (01/09 0525)  Intake/Output from previous day: 01/08 0701 - 01/09 0700 In: 240 [P.O.:240] Out: 200 [Urine:200] Intake/Output this shift: No intake/output data recorded.  Recent Labs    05/06/21 0618  HGB 11.1*   Recent Labs    05/06/21 0618  WBC 11.9*  RBC 3.92  HCT 33.4*  PLT 382   Recent Labs    05/06/21 0618 05/07/21 0548  NA 123* 129*  K 4.1 4.1  CL 90* 95*  CO2 27 29  BUN 13 13  CREATININE 0.92 0.85  GLUCOSE 107* 98  CALCIUM 8.3* 8.4*   No results for input(s): LABPT, INR in the last 72 hours.  EXAM General - Patient is Alert, Appropriate, and Oriented Extremity - Neurovascular intact Sensation intact distally Intact pulses distally Dorsiflexion/Plantar flexion intact Dressing - dressing C/D/I and no drainage, provena intact with out drainage Motor Function - intact, moving foot and toes well on exam.   Past Medical History:  Diagnosis Date   Allergy    Anxiety    Arthritis    knees   Asthma    Complication of anesthesia    slow to wake after C-Section   COPD (chronic obstructive pulmonary disease) (HCC)    Depression    Family history of adverse reaction to anesthesia    N/V   GERD (gastroesophageal reflux disease)    Glaucoma    H/O cervical fracture    age 38, no residual deficits   Hypertension    Pneumonia    PONV (postoperative nausea and vomiting)    Sinus headache     Sleep apnea     Assessment/Plan:   4 Days Post-Op Procedure(s) (LRB): TOTAL KNEE ARTHROPLASTY (Right) Principal Problem:   S/P TKR (total knee replacement) using cement, right Active Problems:   Surgery, elective   S/P TKR (total knee replacement)  Estimated body mass index is 48.54 kg/m as calculated from the following:   Height as of this encounter: 5\' 3"  (1.6 m).   Weight as of this encounter: 124.3 kg. Advance diet Up with therapy Work on BM VSS Hyponatremia - Na 129.  Continue with fluid restriction and hold BP meds. Labs pending this am CM to assist with discharge to SNF.  [facility].   DVT Prophylaxis - Lovenox, TED hose, and SCDs Weight-Bearing as tolerated to right leg   T. Wednesday, PA-C [facility] 05/08/2021, 8:09 AM

## 2021-05-08 NOTE — Progress Notes (Signed)
Physical Therapy Treatment Patient Details Name: Diane Small MRN: [id number] DOB: 1955-05-06 Today's Date: 05/08/2021   History of Present Illness 66 y.o. female s/p R TKA.    PT Comments    Participated in exercises as described below.  OOB and is able to walk x 200' with RW and min guard.  One knee buckles but she is able to self correct.  Overall progressing well but SNF remains recommended for discharge due to no support at home.  Planned DC today to SNF.   Recommendations for follow up therapy are one component of a multi-disciplinary discharge planning process, led by the attending physician.  Recommendations may be updated based on patient status, additional functional criteria and insurance authorization.  Follow Up Recommendations  Skilled nursing-short term rehab (<3 hours/day)     Assistance Recommended at Discharge Intermittent Supervision/Assistance  Patient can return home with the following A little help with walking and/or transfers;Help with stairs or ramp for entrance;A little help with bathing/dressing/bathroom;Assist for transportation   Equipment Recommendations   (to be determined at next level of care)    Recommendations for Other Services       Precautions / Restrictions Precautions Precautions: Fall Restrictions Weight Bearing Restrictions: Yes RLE Weight Bearing: Weight bearing as tolerated     Mobility  Bed Mobility Overal bed mobility: Needs Assistance Bed Mobility: Supine to Sit     Supine to sit: HOB elevated;Min assist          Transfers Overall transfer level: Needs assistance Equipment used: Rolling walker (2 wheels) Transfers: Sit to/from Stand Sit to Stand: Min guard                Ambulation/Gait Ambulation/Gait assistance: Min guard Gait Distance (Feet): 200 Feet Assistive device: Rolling walker (2 wheels) Gait Pattern/deviations: Step-to pattern;Step-through pattern;Trunk flexed Gait velocity: decreased      General Gait Details: one LOB buckling but able to self correct and control   Stairs             Wheelchair Mobility    Modified Rankin (Stroke Patients Only)       Balance Overall balance assessment: Needs assistance Sitting-balance support: No upper extremity supported;Feet supported Sitting balance-Leahy Scale: Good     Standing balance support: During functional activity;Bilateral upper extremity supported;Reliant on assistive device for balance Standing balance-Leahy Scale: Fair Standing balance comment: reliant on rolling walker for UE support in standing                            Cognition Arousal/Alertness: Awake/alert Behavior During Therapy: WFL for tasks assessed/performed Overall Cognitive Status: Within Functional Limits for tasks assessed                                          Exercises Total Joint Exercises Ankle Circles/Pumps: AROM;Strengthening;Both;10 reps;Seated Quad Sets: AROM;Strengthening;Both;10 reps;Seated Hip ABduction/ADduction: AROM;Strengthening;Right;5 reps;Seated Straight Leg Raises: AAROM;Strengthening;Right;10 reps;Seated Long Arc Quad: AAROM;Strengthening;Right;10 reps;Seated Goniometric ROM: 3-87    General Comments        Pertinent Vitals/Pain Pain Assessment: Faces Faces Pain Scale: Hurts a little bit Pain Location: R knee during ambulation Pain Descriptors / Indicators: Sore Pain Intervention(s): Limited activity within patient's tolerance;Premedicated before session;Repositioned;Ice applied    Home Living  Prior Function            PT Goals (current goals can now be found in the care plan section) Progress towards PT goals: Progressing toward goals    Frequency    BID      PT Plan Current plan remains appropriate    Co-evaluation              AM-PAC PT "6 Clicks" Mobility   Outcome Measure  Help needed turning from your back to  your side while in a flat bed without using bedrails?: A Little Help needed moving from lying on your back to sitting on the side of a flat bed without using bedrails?: A Little Help needed moving to and from a bed to a chair (including a wheelchair)?: A Little Help needed standing up from a chair using your arms (e.g., wheelchair or bedside chair)?: A Little Help needed to walk in hospital room?: A Little Help needed climbing 3-5 steps with a railing? : A Lot 6 Click Score: 17    End of Session Equipment Utilized During Treatment: Gait belt Activity Tolerance: Patient tolerated treatment well Patient left: in chair;with call bell/phone within reach Nurse Communication: Patient requests pain meds PT Visit Diagnosis: Unsteadiness on feet (R26.81);Other abnormalities of gait and mobility (R26.89);Muscle weakness (generalized) (M62.81);Difficulty in walking, not elsewhere classified (R26.2)     Time: 2355-7322 PT Time Calculation (min) (ACUTE ONLY): 27 min  Charges:  $Gait Training: 8-22 mins $Therapeutic Exercise: 8-22 mins                    Danielle Dess, PTA 05/08/21, 12:02 PM

## 2021-05-08 NOTE — Care Management Important Message (Signed)
Important Message  Patient Details  Name: Diane Small MRN: [id number] Date of Birth: 07/17/1955   Medicare Important Message Given:  Yes     [name] 05/08/2021, 11:47 AM

## 2021-05-08 NOTE — TOC Progression Note (Signed)
Transition of Care [facility]) - Progression Note    Patient Details  Name: Diane Small MRN: [id number] Date of Birth: Jan 23, 1956  Transition of Care [facility]) CM/SW Contact  Marlowe Sax, RN Phone Number: 05/08/2021, 8:59 AM  Clinical Narrative:    Received notice that the insurance [employer] has been approved 1/9- 1/11 Navi ID number [id number]        Expected Discharge Plan and Services                                                 Social Determinants of Health (SDOH) Interventions    Readmission Risk Interventions No flowsheet data found.

## 2021-05-08 NOTE — TOC Progression Note (Signed)
Transition of Care [facility]) - Progression Note    Patient Details  Name: Diane Small MRN: [id number] Date of Birth: January 04, 1956  Transition of Care [facility]) CM/SW Contact  Marlowe Sax, RN Phone Number: 05/08/2021, 2:21 PM  Clinical Narrative:   The patient is being transported by her sister to [facility], she called her sister to pick her up         Expected Discharge Plan and Services           Expected Discharge Date: 05/08/21                                     Social Determinants of Health (SDOH) Interventions    Readmission Risk Interventions No flowsheet data found.

## 2021-05-08 NOTE — Plan of Care (Signed)

## 2021-05-09 DIAGNOSIS — J45909 Unspecified asthma, uncomplicated: Secondary | ICD-10-CM | POA: Diagnosis not present

## 2021-05-09 DIAGNOSIS — Z96651 Presence of right artificial knee joint: Secondary | ICD-10-CM | POA: Diagnosis not present

## 2021-05-09 DIAGNOSIS — I10 Essential (primary) hypertension: Secondary | ICD-10-CM | POA: Diagnosis not present

## 2021-05-09 DIAGNOSIS — F32A Depression, unspecified: Secondary | ICD-10-CM | POA: Diagnosis not present

## 2021-05-12 DIAGNOSIS — I10 Essential (primary) hypertension: Secondary | ICD-10-CM | POA: Diagnosis not present

## 2021-05-12 DIAGNOSIS — Z96651 Presence of right artificial knee joint: Secondary | ICD-10-CM | POA: Diagnosis not present

## 2021-05-12 DIAGNOSIS — R197 Diarrhea, unspecified: Secondary | ICD-10-CM | POA: Diagnosis not present

## 2021-05-16 DIAGNOSIS — F32A Depression, unspecified: Secondary | ICD-10-CM | POA: Diagnosis not present

## 2021-05-16 DIAGNOSIS — R52 Pain, unspecified: Secondary | ICD-10-CM | POA: Diagnosis not present

## 2021-05-16 DIAGNOSIS — J45909 Unspecified asthma, uncomplicated: Secondary | ICD-10-CM | POA: Diagnosis not present

## 2021-05-16 DIAGNOSIS — I10 Essential (primary) hypertension: Secondary | ICD-10-CM | POA: Diagnosis not present

## 2021-05-16 DIAGNOSIS — Z96651 Presence of right artificial knee joint: Secondary | ICD-10-CM | POA: Diagnosis not present

## 2021-05-17 DIAGNOSIS — R350 Frequency of micturition: Secondary | ICD-10-CM | POA: Diagnosis not present

## 2021-05-17 DIAGNOSIS — I10 Essential (primary) hypertension: Secondary | ICD-10-CM | POA: Diagnosis not present

## 2021-05-17 DIAGNOSIS — Z96651 Presence of right artificial knee joint: Secondary | ICD-10-CM | POA: Diagnosis not present

## 2021-05-18 DIAGNOSIS — N390 Urinary tract infection, site not specified: Secondary | ICD-10-CM | POA: Diagnosis not present

## 2021-05-19 DIAGNOSIS — I10 Essential (primary) hypertension: Secondary | ICD-10-CM | POA: Diagnosis not present

## 2021-05-19 DIAGNOSIS — N390 Urinary tract infection, site not specified: Secondary | ICD-10-CM | POA: Diagnosis not present

## 2021-05-19 DIAGNOSIS — Z96651 Presence of right artificial knee joint: Secondary | ICD-10-CM | POA: Diagnosis not present

## 2021-05-24 DIAGNOSIS — M6281 Muscle weakness (generalized): Secondary | ICD-10-CM | POA: Diagnosis not present

## 2021-05-24 DIAGNOSIS — Z96651 Presence of right artificial knee joint: Secondary | ICD-10-CM | POA: Diagnosis not present

## 2021-05-24 DIAGNOSIS — M25661 Stiffness of right knee, not elsewhere classified: Secondary | ICD-10-CM | POA: Diagnosis not present

## 2021-05-24 DIAGNOSIS — M25561 Pain in right knee: Secondary | ICD-10-CM | POA: Diagnosis not present

## 2021-05-26 DIAGNOSIS — Z96651 Presence of right artificial knee joint: Secondary | ICD-10-CM | POA: Diagnosis not present

## 2021-05-26 DIAGNOSIS — M6281 Muscle weakness (generalized): Secondary | ICD-10-CM | POA: Diagnosis not present

## 2021-05-26 DIAGNOSIS — M25661 Stiffness of right knee, not elsewhere classified: Secondary | ICD-10-CM | POA: Diagnosis not present

## 2021-05-26 DIAGNOSIS — M25561 Pain in right knee: Secondary | ICD-10-CM | POA: Diagnosis not present

## 2021-05-29 DIAGNOSIS — M25561 Pain in right knee: Secondary | ICD-10-CM | POA: Diagnosis not present

## 2021-05-29 DIAGNOSIS — M25661 Stiffness of right knee, not elsewhere classified: Secondary | ICD-10-CM | POA: Diagnosis not present

## 2021-05-29 DIAGNOSIS — M6281 Muscle weakness (generalized): Secondary | ICD-10-CM | POA: Diagnosis not present

## 2021-05-29 DIAGNOSIS — Z96651 Presence of right artificial knee joint: Secondary | ICD-10-CM | POA: Diagnosis not present

## 2021-05-31 DIAGNOSIS — Z96651 Presence of right artificial knee joint: Secondary | ICD-10-CM | POA: Diagnosis not present

## 2021-05-31 DIAGNOSIS — M6281 Muscle weakness (generalized): Secondary | ICD-10-CM | POA: Diagnosis not present

## 2021-05-31 DIAGNOSIS — M25661 Stiffness of right knee, not elsewhere classified: Secondary | ICD-10-CM | POA: Diagnosis not present

## 2021-05-31 DIAGNOSIS — M25561 Pain in right knee: Secondary | ICD-10-CM | POA: Diagnosis not present

## 2021-06-02 DIAGNOSIS — M25661 Stiffness of right knee, not elsewhere classified: Secondary | ICD-10-CM | POA: Diagnosis not present

## 2021-06-02 DIAGNOSIS — M25561 Pain in right knee: Secondary | ICD-10-CM | POA: Diagnosis not present

## 2021-06-02 DIAGNOSIS — M6281 Muscle weakness (generalized): Secondary | ICD-10-CM | POA: Diagnosis not present

## 2021-06-02 DIAGNOSIS — Z96651 Presence of right artificial knee joint: Secondary | ICD-10-CM | POA: Diagnosis not present

## 2021-06-06 DIAGNOSIS — M25661 Stiffness of right knee, not elsewhere classified: Secondary | ICD-10-CM | POA: Diagnosis not present

## 2021-06-06 DIAGNOSIS — M25561 Pain in right knee: Secondary | ICD-10-CM | POA: Diagnosis not present

## 2021-06-06 DIAGNOSIS — M6281 Muscle weakness (generalized): Secondary | ICD-10-CM | POA: Diagnosis not present

## 2021-06-06 DIAGNOSIS — Z96651 Presence of right artificial knee joint: Secondary | ICD-10-CM | POA: Diagnosis not present

## 2021-06-08 DIAGNOSIS — M25661 Stiffness of right knee, not elsewhere classified: Secondary | ICD-10-CM | POA: Diagnosis not present

## 2021-06-08 DIAGNOSIS — Z96651 Presence of right artificial knee joint: Secondary | ICD-10-CM | POA: Diagnosis not present

## 2021-06-08 DIAGNOSIS — M25561 Pain in right knee: Secondary | ICD-10-CM | POA: Diagnosis not present

## 2021-06-08 DIAGNOSIS — M6281 Muscle weakness (generalized): Secondary | ICD-10-CM | POA: Diagnosis not present

## 2021-06-12 ENCOUNTER — Encounter: Payer: Self-pay | Admitting: Family Medicine

## 2021-06-12 ENCOUNTER — Other Ambulatory Visit: Payer: Self-pay

## 2021-06-12 ENCOUNTER — Ambulatory Visit (INDEPENDENT_AMBULATORY_CARE_PROVIDER_SITE_OTHER): Payer: Medicare Other | Admitting: Family Medicine

## 2021-06-12 VITALS — BP 132/80 | HR 74 | Ht 63.0 in | Wt 274.0 lb

## 2021-06-12 DIAGNOSIS — D649 Anemia, unspecified: Secondary | ICD-10-CM

## 2021-06-12 DIAGNOSIS — I10 Essential (primary) hypertension: Secondary | ICD-10-CM | POA: Diagnosis not present

## 2021-06-12 DIAGNOSIS — R739 Hyperglycemia, unspecified: Secondary | ICD-10-CM | POA: Diagnosis not present

## 2021-06-12 NOTE — Progress Notes (Addendum)
Date:  06/12/2021   Name:  Diane Small   DOB:  15-Nov-1955   MRN:  [id number]   Chief Complaint: Anemia and Hyperglycemia  Anemia Presents for follow-up visit. There has been no abdominal pain, anorexia, bruising/bleeding easily, confusion, fever, leg swelling, light-headedness, malaise/fatigue, palpitations, paresthesias or weight loss. Signs of blood loss that are not present include hematemesis, hematochezia, melena and vaginal bleeding. There are no compliance problems.   Hyperglycemia This is a new problem. The current episode started more than 1 month ago. The problem occurs intermittently. The problem has been waxing and waning. Pertinent negatives include no abdominal pain, anorexia, chest pain, chills, coughing, fatigue, fever, headaches, myalgias, nausea, neck pain, rash or sore throat. Nothing aggravates the symptoms. She has tried nothing for the symptoms. The treatment provided mild relief.   Lab Results  Component Value Date   NA 133 (L) 05/08/2021   K 4.2 05/08/2021   CO2 30 05/08/2021   GLUCOSE 115 (H) 05/08/2021   BUN 8 05/08/2021   CREATININE 0.71 05/08/2021   CALCIUM 8.2 (L) 05/08/2021   GFRNONAA >60 05/08/2021   Lab Results  Component Value Date   CHOL 172 10/03/2020   HDL 48 10/03/2020   LDLCALC 99 10/03/2020   TRIG 145 10/03/2020   CHOLHDL 3.2 10/15/2017   Lab Results  Component Value Date   TSH 1.210 10/15/2017   No results found for: HGBA1C Lab Results  Component Value Date   WBC 11.9 (H) 05/06/2021   HGB 11.1 (L) 05/06/2021   HCT 33.4 (L) 05/06/2021   MCV 85.2 05/06/2021   PLT 382 05/06/2021   Lab Results  Component Value Date   ALT 14 04/18/2021   AST 22 04/18/2021   ALKPHOS 69 04/18/2021   BILITOT 0.6 04/18/2021   No results found for: 25OHVITD2, 25OHVITD3, VD25OH   Review of Systems  Constitutional:  Negative for chills, fatigue, fever, malaise/fatigue and weight loss.  HENT:  Negative for drooling, ear discharge, ear pain  and sore throat.   Respiratory:  Negative for cough, shortness of breath and wheezing.   Cardiovascular:  Negative for chest pain, palpitations and leg swelling.  Gastrointestinal:  Negative for abdominal pain, anorexia, blood in stool, constipation, diarrhea, hematemesis, hematochezia, melena and nausea.  Endocrine: Negative for polydipsia.  Genitourinary:  Negative for dysuria, frequency, hematuria, urgency and vaginal bleeding.  Musculoskeletal:  Negative for back pain, myalgias and neck pain.  Skin:  Negative for rash.  Allergic/Immunologic: Negative for environmental allergies.  Neurological:  Negative for dizziness, light-headedness, headaches and paresthesias.  Hematological:  Does not bruise/bleed easily.  Psychiatric/Behavioral:  Negative for confusion and suicidal ideas. The patient is not nervous/anxious.    Patient Active Problem List   Diagnosis Date Noted   Surgery, elective 05/05/2021   S/P TKR (total knee replacement) 05/05/2021   S/P TKR (total knee replacement) using cement, right 05/04/2021   Rotator cuff tendinitis, right 10/05/2020   Pure hypercholesterolemia 04/16/2017   Hyperlipidemia 04/18/2016   DDD (degenerative disc disease), lumbosacral 10/17/2015   Esophageal reflux 04/18/2015   Essential hypertension 04/18/2015   Annual physical exam 04/18/2015   Routine general medical examination at a health care facility 09/01/2014   Glaucoma 09/01/2014   BMI 45.0-49.9, adult (HCC) 09/01/2014   Arthritis of knee, degenerative 09/09/2013    Allergies  Allergen Reactions   Amoxicillin-Pot Clavulanate Diarrhea    TOLERATED CEFAZOLIN    Past Surgical History:  Procedure Laterality Date   CERVICAL SPINE SURGERY  03/01/1981  C4 fracture from [place]   COLONOSCOPY WITH ESOPHAGOGASTRODUODENOSCOPY (EGD)  2011   COLONOSCOPY WITH PROPOFOL N/A 07/13/2019   Procedure: COLONOSCOPY WITH PROPOFOL;  Surgeon: Lucilla Lame, MD;  Location:  [facility];  Service: Endoscopy;  Laterality: N/A;   ESOPHAGOGASTRODUODENOSCOPY (EGD) WITH PROPOFOL N/A 07/13/2019   Procedure: ESOPHAGOGASTRODUODENOSCOPY (EGD) WITH PROPOFOL;  Surgeon: Lucilla Lame, MD;  Location: [facility];  Service: Endoscopy;  Laterality: N/A;  PRIORITY 3   TOTAL ABDOMINAL HYSTERECTOMY  1992   TOTAL KNEE ARTHROPLASTY Left 07/27/2014   TOTAL KNEE ARTHROPLASTY Right 05/04/2021   Procedure: TOTAL KNEE ARTHROPLASTY;  Surgeon: Hessie Knows, MD;  Location: [facility];  Service: Orthopedics;  Laterality: Right;    Social History   Tobacco Use   Smoking status: Never   Smokeless tobacco: Never  Vaping Use   Vaping Use: Never used  Substance Use Topics   Alcohol use: Yes    Comment: rarely   Drug use: No     Medication list has been reviewed and updated.  Current Meds  Medication Sig   acetaminophen (TYLENOL) 650 MG CR tablet Take 1,300 mg by mouth every 8 (eight) hours as needed for pain.   albuterol (VENTOLIN HFA) 108 (90 Base) MCG/ACT inhaler INHALE 1-2 PUFFS INTO THE LUNGS EVERY 6 (SIX) HOURS AS NEEDED FOR WHEEZING OR SHORTNESS OF BREATH.   ALPRAZolam (XANAX) 1 MG tablet Take 2 mg by mouth at bedtime. psych   azelastine (ASTELIN) 0.1 % nasal spray Place 1 spray into both nostrils 2 (two) times daily. Dr Pryor Ochoa   budesonide-formoterol [facility]) 160-4.5 MCG/ACT inhaler Inhale 2 puffs into the lungs daily as needed (asthma). Dr Pryor Ochoa   dexlansoprazole (DEXILANT) 60 MG capsule TAKE 1 CAPSULE (60 MG TOTAL) BY MOUTH DAILY. DR Pryor Ochoa   docusate sodium (COLACE) 100 MG capsule Take 1 capsule (100 mg total) by mouth 2 (two) times daily.   escitalopram (LEXAPRO) 5 MG tablet Take 5 mg by mouth daily. psych   hydrochlorothiazide (HYDRODIURIL) 25 MG tablet TAKE 1 TABLET BY MOUTH EVERY DAY   HYDROcodone-acetaminophen (NORCO/VICODIN) 5-325 MG tablet Take 1-2 tablets by mouth every 4 (four) hours as needed for moderate pain (pain score 4-6).   latanoprost  (XALATAN) 0.005 % ophthalmic solution Place 1 drop into both eyes at bedtime. Dr Wallace Going   levocetirizine (XYZAL) 5 MG tablet Take 5 mg by mouth daily.   montelukast (SINGULAIR) 10 MG tablet TAKE 1 TABLET BY MOUTH EVERY DAY   Omega-3 Fatty Acids (FISH OIL PO) Take 1 capsule by mouth daily.   telmisartan (MICARDIS) 40 MG tablet TAKE 1 TABLET BY MOUTH EVERY DAY   timolol (TIMOPTIC) 0.5 % ophthalmic solution Place 1 drop into both eyes daily. Dr Wallace Going   [DISCONTINUED] potassium chloride (KLOR-CON) 10 MEQ tablet Take 10 mEq by mouth 2 (two) times daily.    PHQ 2/9 Scores 04/25/2021 11/23/2020 10/05/2020 10/03/2020  PHQ - 2 Score 0 0 2 0  PHQ- 9 Score 0 3 7 0    GAD 7 : Generalized Anxiety Score 04/25/2021 10/05/2020 10/03/2020 04/21/2020  Nervous, Anxious, on Edge 0 1 0 0  Control/stop worrying 0 1 0 0  Worry too much - different things 0 1 0 0  Trouble relaxing 0 1 0 0  Restless 0 0 0 0  Easily annoyed or irritable 0 0 0 0  Afraid - awful might happen 0 1 0 0  Total GAD 7 Score 0 5  0 0  Anxiety Difficulty Not difficult at all Somewhat difficult - Not difficult at all    BP Readings from Last 3 Encounters:  06/12/21 132/80  05/08/21 (!) 142/63  04/25/21 120/80    Physical Exam Vitals and nursing note reviewed.  Constitutional:      Appearance: She is well-developed.  HENT:     Head: Normocephalic.     Right Ear: External ear normal.     Left Ear: External ear normal.  Eyes:     General: Lids are everted, no foreign bodies appreciated. No scleral icterus.       Left eye: No foreign body or hordeolum.     Conjunctiva/sclera: Conjunctivae normal.     Right eye: Right conjunctiva is not injected.     Left eye: Left conjunctiva is not injected.     Pupils: Pupils are equal, round, and reactive to light.  Neck:     Thyroid: No thyromegaly.     Vascular: No JVD.     Trachea: No tracheal deviation.  Cardiovascular:     Rate and Rhythm: Normal rate and regular rhythm.     Heart  sounds: Normal heart sounds, S1 normal and S2 normal. No murmur heard. No systolic murmur is present.  No diastolic murmur is present.    No friction rub. No gallop. No S3 or S4 sounds.  Pulmonary:     Effort: Pulmonary effort is normal. No respiratory distress.     Breath sounds: Normal breath sounds. No decreased breath sounds, wheezing, rhonchi or rales.  Abdominal:     General: Bowel sounds are normal.     Palpations: Abdomen is soft. There is no mass.     Tenderness: There is no abdominal tenderness. There is no guarding or rebound.  Musculoskeletal:        General: Normal range of motion.     Cervical back: Neck supple.     Right lower leg: No edema.     Left lower leg: No edema.  Skin:    General: Skin is warm.     Findings: No rash.  Neurological:     Mental Status: She is alert.  Psychiatric:        Mood and Affect: Mood is not anxious or depressed.    Wt Readings from Last 3 Encounters:  06/12/21 274 lb (124.3 kg)  05/04/21 274 lb (124.3 kg)  04/25/21 274 lb (124.3 kg)    BP 132/80    Pulse 74    Ht 5\' 3"  (1.6 m)    Wt 274 lb (124.3 kg)    BMI 48.54 kg/m   Assessment and Plan:  1. Essential hypertension Chronic.  Controlled.  Stable.  Blood pressure today is in excellent range at 132/80.  Although [facility] had an increase of 20 mg to a total of 60 mg of myocarditis however upon discharge patient returned to her previous dosing of 40 mg.  She is also resumed her hydrochlorothiazide.  Blood pressure today is acceptable and we will continue on present dosing and will check renal function panel. - Renal Function Panel  2. Anemia, unspecified type Patient with history of anemia we will check current level of CBC and also her ferritin to see if she is getting sufficient iron in her diet.  To the best my knowledge she is not on supplemental iron and we will check CBC and ferritin with that evaluation. - CBC w/Diff/Platelet - Ferritin  3.  Hyperglycemia Patient  was also noted to have very mild elevation of her glucose no higher than the mid 120s.  This may be consistent with prediabetes but we will check an A1c to see if this has been over the course of the last 6 weeks an issue.  Renal function panel today we will assess a spot glucose reading but I do not think patient is fasting at this time. - HgB A1c

## 2021-06-13 LAB — CBC WITH DIFFERENTIAL/PLATELET
Basophils Absolute: 0.1 10*3/uL (ref 0.0–0.2)
Basos: 1 %
EOS (ABSOLUTE): 0.1 10*3/uL (ref 0.0–0.4)
Eos: 1 %
Hematocrit: 39.8 % (ref 34.0–46.6)
Hemoglobin: 12.6 g/dL (ref 11.1–15.9)
Immature Grans (Abs): 0 10*3/uL (ref 0.0–0.1)
Immature Granulocytes: 0 %
Lymphocytes Absolute: 2.1 10*3/uL (ref 0.7–3.1)
Lymphs: 21 %
MCH: 27.2 pg (ref 26.6–33.0)
MCHC: 31.7 g/dL (ref 31.5–35.7)
MCV: 86 fL (ref 79–97)
Monocytes Absolute: 0.8 10*3/uL (ref 0.1–0.9)
Monocytes: 8 %
Neutrophils Absolute: 6.8 10*3/uL (ref 1.4–7.0)
Neutrophils: 69 %
Platelets: 449 10*3/uL (ref 150–450)
RBC: 4.64 x10E6/uL (ref 3.77–5.28)
RDW: 14 % (ref 11.7–15.4)
WBC: 9.9 10*3/uL (ref 3.4–10.8)

## 2021-06-13 LAB — HEMOGLOBIN A1C
Est. average glucose Bld gHb Est-mCnc: 111 mg/dL
Hgb A1c MFr Bld: 5.5 % (ref 4.8–5.6)

## 2021-06-13 LAB — RENAL FUNCTION PANEL
Albumin: 4.2 g/dL (ref 3.8–4.8)
BUN/Creatinine Ratio: 10 — ABNORMAL LOW (ref 12–28)
BUN: 9 mg/dL (ref 8–27)
CO2: 25 mmol/L (ref 20–29)
Calcium: 9.7 mg/dL (ref 8.7–10.3)
Chloride: 100 mmol/L (ref 96–106)
Creatinine, Ser: 0.88 mg/dL (ref 0.57–1.00)
Glucose: 107 mg/dL — ABNORMAL HIGH (ref 70–99)
Phosphorus: 4.8 mg/dL — ABNORMAL HIGH (ref 3.0–4.3)
Potassium: 4.4 mmol/L (ref 3.5–5.2)
Sodium: 142 mmol/L (ref 134–144)
eGFR: 73 mL/min/{1.73_m2} (ref >59)

## 2021-06-13 LAB — FERRITIN: Ferritin: 112 ng/mL (ref 15–150)

## 2021-06-14 DIAGNOSIS — M25661 Stiffness of right knee, not elsewhere classified: Secondary | ICD-10-CM | POA: Diagnosis not present

## 2021-06-14 DIAGNOSIS — M25561 Pain in right knee: Secondary | ICD-10-CM | POA: Diagnosis not present

## 2021-06-14 DIAGNOSIS — M6281 Muscle weakness (generalized): Secondary | ICD-10-CM | POA: Diagnosis not present

## 2021-06-14 DIAGNOSIS — Z96651 Presence of right artificial knee joint: Secondary | ICD-10-CM | POA: Diagnosis not present

## 2021-06-19 ENCOUNTER — Telehealth: Payer: Self-pay

## 2021-06-19 ENCOUNTER — Other Ambulatory Visit: Payer: Self-pay

## 2021-06-19 ENCOUNTER — Ambulatory Visit: Payer: Medicare Other

## 2021-06-19 DIAGNOSIS — F419 Anxiety disorder, unspecified: Secondary | ICD-10-CM

## 2021-06-19 DIAGNOSIS — M1711 Unilateral primary osteoarthritis, right knee: Secondary | ICD-10-CM | POA: Diagnosis not present

## 2021-06-19 NOTE — Telephone Encounter (Signed)
Copied from CRM [id number]. Topic: General - Other >> Jun 19, 2021  2:22 PM Herby Abraham C wrote: Reason for CRM: pt called in for assistance. Pt says that she received a referral but was told that they dont accept her insurance. Pt says that she cant afford to pay out of office. Pt would like assistance with getting set up with someone that accept her Medicare.   Pt also says that she has been out of her anxiety medication for 3 weeks. Pt says that she is still having knee pain. Pt would like further assistance   Pharmacy: [id number] [place], [place] - [address]  Phone:  [phone] Fax:  [phone]     Phone: [phone]

## 2021-06-19 NOTE — Telephone Encounter (Signed)
Returned pt's call concerning ref to psych. I placed referral and asked for urgent first available. SHe prefers to stay in [place] area.

## 2021-06-19 NOTE — Progress Notes (Signed)
Put in referral to psych

## 2021-06-20 DIAGNOSIS — M25661 Stiffness of right knee, not elsewhere classified: Secondary | ICD-10-CM | POA: Diagnosis not present

## 2021-06-20 DIAGNOSIS — M25561 Pain in right knee: Secondary | ICD-10-CM | POA: Diagnosis not present

## 2021-06-20 DIAGNOSIS — Z96651 Presence of right artificial knee joint: Secondary | ICD-10-CM | POA: Diagnosis not present

## 2021-06-20 DIAGNOSIS — M6281 Muscle weakness (generalized): Secondary | ICD-10-CM | POA: Diagnosis not present

## 2021-06-22 DIAGNOSIS — M25661 Stiffness of right knee, not elsewhere classified: Secondary | ICD-10-CM | POA: Diagnosis not present

## 2021-06-22 DIAGNOSIS — M6281 Muscle weakness (generalized): Secondary | ICD-10-CM | POA: Diagnosis not present

## 2021-06-22 DIAGNOSIS — M25561 Pain in right knee: Secondary | ICD-10-CM | POA: Diagnosis not present

## 2021-06-22 DIAGNOSIS — Z96651 Presence of right artificial knee joint: Secondary | ICD-10-CM | POA: Diagnosis not present

## 2021-06-26 DIAGNOSIS — M25661 Stiffness of right knee, not elsewhere classified: Secondary | ICD-10-CM | POA: Diagnosis not present

## 2021-06-26 DIAGNOSIS — M6281 Muscle weakness (generalized): Secondary | ICD-10-CM | POA: Diagnosis not present

## 2021-06-26 DIAGNOSIS — Z96651 Presence of right artificial knee joint: Secondary | ICD-10-CM | POA: Diagnosis not present

## 2021-06-26 DIAGNOSIS — M25561 Pain in right knee: Secondary | ICD-10-CM | POA: Diagnosis not present

## 2021-06-29 DIAGNOSIS — M25561 Pain in right knee: Secondary | ICD-10-CM | POA: Diagnosis not present

## 2021-06-29 DIAGNOSIS — M6281 Muscle weakness (generalized): Secondary | ICD-10-CM | POA: Diagnosis not present

## 2021-06-29 DIAGNOSIS — Z96651 Presence of right artificial knee joint: Secondary | ICD-10-CM | POA: Diagnosis not present

## 2021-06-29 DIAGNOSIS — M25661 Stiffness of right knee, not elsewhere classified: Secondary | ICD-10-CM | POA: Diagnosis not present

## 2021-07-03 DIAGNOSIS — Z96651 Presence of right artificial knee joint: Secondary | ICD-10-CM | POA: Diagnosis not present

## 2021-07-03 DIAGNOSIS — M25661 Stiffness of right knee, not elsewhere classified: Secondary | ICD-10-CM | POA: Diagnosis not present

## 2021-07-03 DIAGNOSIS — M25561 Pain in right knee: Secondary | ICD-10-CM | POA: Diagnosis not present

## 2021-07-03 DIAGNOSIS — M6281 Muscle weakness (generalized): Secondary | ICD-10-CM | POA: Diagnosis not present

## 2021-07-05 DIAGNOSIS — M6281 Muscle weakness (generalized): Secondary | ICD-10-CM | POA: Diagnosis not present

## 2021-07-05 DIAGNOSIS — M25561 Pain in right knee: Secondary | ICD-10-CM | POA: Diagnosis not present

## 2021-07-05 DIAGNOSIS — M25661 Stiffness of right knee, not elsewhere classified: Secondary | ICD-10-CM | POA: Diagnosis not present

## 2021-07-05 DIAGNOSIS — Z96651 Presence of right artificial knee joint: Secondary | ICD-10-CM | POA: Diagnosis not present

## 2021-07-10 DIAGNOSIS — M25561 Pain in right knee: Secondary | ICD-10-CM | POA: Diagnosis not present

## 2021-07-10 DIAGNOSIS — M6281 Muscle weakness (generalized): Secondary | ICD-10-CM | POA: Diagnosis not present

## 2021-07-10 DIAGNOSIS — Z96651 Presence of right artificial knee joint: Secondary | ICD-10-CM | POA: Diagnosis not present

## 2021-07-10 DIAGNOSIS — M25661 Stiffness of right knee, not elsewhere classified: Secondary | ICD-10-CM | POA: Diagnosis not present

## 2021-07-12 DIAGNOSIS — M6281 Muscle weakness (generalized): Secondary | ICD-10-CM | POA: Diagnosis not present

## 2021-07-12 DIAGNOSIS — Z96651 Presence of right artificial knee joint: Secondary | ICD-10-CM | POA: Diagnosis not present

## 2021-07-12 DIAGNOSIS — M25561 Pain in right knee: Secondary | ICD-10-CM | POA: Diagnosis not present

## 2021-07-12 DIAGNOSIS — M25661 Stiffness of right knee, not elsewhere classified: Secondary | ICD-10-CM | POA: Diagnosis not present

## 2021-07-13 DIAGNOSIS — H401131 Primary open-angle glaucoma, bilateral, mild stage: Secondary | ICD-10-CM | POA: Diagnosis not present

## 2021-07-17 ENCOUNTER — Other Ambulatory Visit: Payer: Self-pay | Admitting: Family Medicine

## 2021-07-17 ENCOUNTER — Other Ambulatory Visit: Payer: Self-pay | Admitting: Orthopedic Surgery

## 2021-07-17 DIAGNOSIS — G8929 Other chronic pain: Secondary | ICD-10-CM | POA: Diagnosis not present

## 2021-07-17 DIAGNOSIS — M25562 Pain in left knee: Secondary | ICD-10-CM | POA: Diagnosis not present

## 2021-07-17 DIAGNOSIS — M1711 Unilateral primary osteoarthritis, right knee: Secondary | ICD-10-CM | POA: Diagnosis not present

## 2021-07-17 DIAGNOSIS — Z96651 Presence of right artificial knee joint: Secondary | ICD-10-CM | POA: Diagnosis not present

## 2021-07-17 DIAGNOSIS — K219 Gastro-esophageal reflux disease without esophagitis: Secondary | ICD-10-CM

## 2021-07-19 ENCOUNTER — Other Ambulatory Visit: Payer: Self-pay

## 2021-07-19 ENCOUNTER — Other Ambulatory Visit
Admission: RE | Admit: 2021-07-19 | Discharge: 2021-07-19 | Disposition: A | Discharge status: Home / Self Care | Payer: Medicare Other | Source: Ambulatory Visit | Attending: Orthopedic Surgery | Admitting: Orthopedic Surgery

## 2021-07-19 NOTE — Patient Instructions (Addendum)
?Your procedure is scheduled on: Thursday July 20, 2021. ?Report to Day Surgery inside [facility] 2nd floor, stop by admissions desk first before getting on elevator. ?To find out your arrival time please call [phone] between 1PM - 3PM on Wednesday July 19, 2021. ? ?Remember: Instructions that are not followed completely may result in serious medical risk,  ?up to and including death, or upon the discretion of your surgeon and anesthesiologist your  ?surgery may need to be rescheduled.  ? ?  _X__ 1. Do not eat food after midnight the night before your procedure. ?                No chewing gum or hard candies. You may drink clear liquids up to 2 hours ?                before you are scheduled to arrive for your surgery- DO not drink clear ?                liquids within 2 hours of the start of your surgery. ?                Clear Liquids include:  water, apple juice without pulp, clear Gatorade, G2 or  ?                Gatorade Zero (avoid Red/Purple/Blue), Black Coffee or Tea (Do not add ?                anything to coffee or tea). ? ?__X__2.   Complete the "Ensure Clear Pre-surgery Clear Carbohydrate Drink" provided to you, 2 hours before arrival. **If you are diabetic you will be provided with an alternative drink, Gatorade Zero or G2. ? ?__X__3.  On the morning of surgery brush your teeth with toothpaste and water, you ?               may rinse your mouth with mouthwash if you wish.  Do not swallow any toothpaste or mouthwash. ?   ? _X__ 4.  No Alcohol for 24 hours before or after surgery. ? ? _X__ 5.  Do Not Smoke or use e-cigarettes For 24 Hours Prior to Your Surgery. ?                Do not use any chewable tobacco products for at least 6 hours prior to ?                Surgery. ? ?_X__  6.  Do not use any recreational drugs (marijuana, cocaine, heroin, ecstasy, MDMA or other) ?               For at least one week prior to your surgery.  Combination of these drugs with  anesthesia ?               May have life threatening results. ? ?____  7.  Bring all medications with you on the day of surgery if instructed.  ? ?__X_ 8.  Notify your doctor if there is any change in your medical condition  ?    (cold, fever, infections). ?    ?Do not wear jewelry, make-up, hairpins, clips or nail polish. ?Do not wear lotions, powders, or perfumes. You may wear deodorant. ?Do not shave 48 hours prior to surgery. Men may shave face and neck. ?Do not bring valuables to the hospital.   ? ? is not responsible for any belongings or valuables. ? ?  Contacts, dentures or bridgework may not be worn into surgery. ?Leave your suitcase in the car. After surgery it may be brought to your room. ?For patients admitted to the hospital, discharge time is determined by your ?treatment team. ?  ?Patients discharged the day of surgery will not be allowed to drive home.   ?Make arrangements for someone to be with you for the first 24 hours of your ?Same Day Discharge. ? ? ? ?__X__ Take these medicines the morning of surgery with A SIP OF WATER:  ? ? 1. dexlansoprazole (DEXILANT) 60 MG ? 2. acetaminophen-codeine (TYLENOL #3) 300-30 MG tablet ? 3.  ? 4. ? 5. ? 6. ? ?____ Fleet Enema (as directed)  ? ?__X__ Use CHG Soap (or wipes) as directed ? ?____ Use Benzoyl Peroxide Gel as instructed ? ?__X__ Use inhalers on the day of surgery ? albuterol (VENTOLIN HFA) 108 (90 Base) MCG/ACT inhaler ? budesonide-formoterol (SYMBICORT) 160-4.5 MCG/ACT inhaler ? ?____ Stop metformin 2 days prior to surgery   ? ?____ Take 1/2 of usual insulin dose the night before surgery. No insulin the morning ?         of surgery.  ? ?____ Call your PCP, cardiologist, or Pulmonologist if taking Coumadin/Plavix/aspirin and ask when to stop before your surgery.  ? ?__X__ One Week prior to surgery- Stop Anti-inflammatories such as Ibuprofen, Aleve, Advil, Motrin, meloxicam (MOBIC), diclofenac, etodolac, ketorolac, Toradol, Daypro, piroxicam,  Goody's or BC powders. OK TO USE TYLENOL IF NEEDED ?  ?__X__ Stop supplements until after surgery.   ? ?____ Bring C-Pap to the hospital.  ? ? ?If you have any questions regarding your pre-procedure instructions,  ?Please call Pre-admit Testing at [phone] ?

## 2021-07-20 ENCOUNTER — Ambulatory Visit
Admission: RE | Admit: 2021-07-20 | Discharge: 2021-07-20 | Disposition: A | Discharge status: Home / Self Care | Payer: Medicare Other | Source: Ambulatory Visit | Attending: Orthopedic Surgery | Admitting: Orthopedic Surgery

## 2021-07-20 ENCOUNTER — Other Ambulatory Visit: Payer: Self-pay

## 2021-07-20 ENCOUNTER — Encounter
Admission: RE | Disposition: A | Discharge status: Home / Self Care | Payer: Self-pay | Source: Ambulatory Visit | Attending: Orthopedic Surgery

## 2021-07-20 ENCOUNTER — Encounter: Payer: Self-pay | Admitting: Orthopedic Surgery

## 2021-07-20 ENCOUNTER — Ambulatory Visit: Payer: Medicare Other | Admitting: Urgent Care

## 2021-07-20 ENCOUNTER — Ambulatory Visit: Payer: Medicare Other

## 2021-07-20 ENCOUNTER — Ambulatory Visit: Payer: Medicare Other | Admitting: Certified Registered"

## 2021-07-20 DIAGNOSIS — M659 Synovitis and tenosynovitis, unspecified: Secondary | ICD-10-CM | POA: Insufficient documentation

## 2021-07-20 DIAGNOSIS — F32A Depression, unspecified: Secondary | ICD-10-CM | POA: Diagnosis not present

## 2021-07-20 DIAGNOSIS — Z96653 Presence of artificial knee joint, bilateral: Secondary | ICD-10-CM | POA: Insufficient documentation

## 2021-07-20 DIAGNOSIS — J449 Chronic obstructive pulmonary disease, unspecified: Secondary | ICD-10-CM | POA: Diagnosis not present

## 2021-07-20 DIAGNOSIS — Z6841 Body Mass Index (BMI) 40.0 and over, adult: Secondary | ICD-10-CM | POA: Insufficient documentation

## 2021-07-20 DIAGNOSIS — G8929 Other chronic pain: Secondary | ICD-10-CM | POA: Diagnosis not present

## 2021-07-20 DIAGNOSIS — M1711 Unilateral primary osteoarthritis, right knee: Secondary | ICD-10-CM | POA: Insufficient documentation

## 2021-07-20 DIAGNOSIS — Y792 Prosthetic and other implants, materials and accessory orthopedic devices associated with adverse incidents: Secondary | ICD-10-CM | POA: Insufficient documentation

## 2021-07-20 DIAGNOSIS — Z8249 Family history of ischemic heart disease and other diseases of the circulatory system: Secondary | ICD-10-CM | POA: Diagnosis not present

## 2021-07-20 DIAGNOSIS — Z79899 Other long term (current) drug therapy: Secondary | ICD-10-CM | POA: Insufficient documentation

## 2021-07-20 DIAGNOSIS — K219 Gastro-esophageal reflux disease without esophagitis: Secondary | ICD-10-CM | POA: Insufficient documentation

## 2021-07-20 DIAGNOSIS — Z8261 Family history of arthritis: Secondary | ICD-10-CM | POA: Insufficient documentation

## 2021-07-20 DIAGNOSIS — G4733 Obstructive sleep apnea (adult) (pediatric): Secondary | ICD-10-CM | POA: Diagnosis not present

## 2021-07-20 DIAGNOSIS — T84022A Instability of internal right knee prosthesis, initial encounter: Secondary | ICD-10-CM | POA: Insufficient documentation

## 2021-07-20 DIAGNOSIS — F419 Anxiety disorder, unspecified: Secondary | ICD-10-CM | POA: Diagnosis not present

## 2021-07-20 DIAGNOSIS — E785 Hyperlipidemia, unspecified: Secondary | ICD-10-CM | POA: Diagnosis not present

## 2021-07-20 DIAGNOSIS — M2342 Loose body in knee, left knee: Secondary | ICD-10-CM | POA: Diagnosis not present

## 2021-07-20 DIAGNOSIS — I10 Essential (primary) hypertension: Secondary | ICD-10-CM | POA: Insufficient documentation

## 2021-07-20 DIAGNOSIS — T8484XA Pain due to internal orthopedic prosthetic devices, implants and grafts, initial encounter: Secondary | ICD-10-CM | POA: Diagnosis not present

## 2021-07-20 DIAGNOSIS — T84033A Mechanical loosening of internal left knee prosthetic joint, initial encounter: Secondary | ICD-10-CM | POA: Diagnosis not present

## 2021-07-20 HISTORY — PX: KNEE ARTHROSCOPY: SHX127

## 2021-07-20 SURGERY — ARTHROSCOPY, KNEE
Anesthesia: General | Site: Knee | Laterality: Left

## 2021-07-20 MED ORDER — CHLORHEXIDINE GLUCONATE 0.12 % MT SOLN
15.0000 mL | Freq: Once | OROMUCOSAL | Status: AC
  Administered 2021-07-20: 15 mL via OROMUCOSAL

## 2021-07-20 MED ORDER — EPHEDRINE 5 MG/ML INJ
INTRAVENOUS | Status: AC
  Filled 2021-07-20: qty 5

## 2021-07-20 MED ORDER — TRAMADOL HCL 50 MG PO TABS: 50.0000 mg | ORAL_TABLET | Freq: Four times a day (QID) | ORAL | 1 refills | Status: AC | PRN

## 2021-07-20 MED ORDER — ONDANSETRON HCL 4 MG PO TABS: 4.0000 mg | ORAL_TABLET | Freq: Four times a day (QID) | ORAL | Status: DC | PRN

## 2021-07-20 MED ORDER — FENTANYL CITRATE (PF) 100 MCG/2ML IJ SOLN: 25.0000 ug | INTRAMUSCULAR | Status: DC | PRN

## 2021-07-20 MED ORDER — KETAMINE HCL 10 MG/ML IJ SOLN
INTRAMUSCULAR | Status: DC | PRN
  Administered 2021-07-20: 30 mg via INTRAVENOUS
  Administered 2021-07-20: 20 mg via INTRAVENOUS
  Administered 2021-07-20: 30 mg via INTRAVENOUS

## 2021-07-20 MED ORDER — LIDOCAINE HCL (CARDIAC) PF 100 MG/5ML IV SOSY
PREFILLED_SYRINGE | INTRAVENOUS | Status: DC | PRN
  Administered 2021-07-20: 100 mg via INTRAVENOUS

## 2021-07-20 MED ORDER — MIDAZOLAM HCL 2 MG/2ML IJ SOLN
INTRAMUSCULAR | Status: AC
Start: 2021-07-20 — End: ?
  Filled 2021-07-20: qty 2

## 2021-07-20 MED ORDER — PROPOFOL 500 MG/50ML IV EMUL
INTRAVENOUS | Status: DC | PRN
  Administered 2021-07-20: 150 ug/kg/min via INTRAVENOUS

## 2021-07-20 MED ORDER — LACTATED RINGERS IR SOLN
Status: DC | PRN
  Administered 2021-07-20 (×8): 3000 mL

## 2021-07-20 MED ORDER — DEXAMETHASONE SODIUM PHOSPHATE 10 MG/ML IJ SOLN
INTRAMUSCULAR | Status: DC | PRN
  Administered 2021-07-20: 5 mg via INTRAVENOUS

## 2021-07-20 MED ORDER — ORAL CARE MOUTH RINSE: 15.0000 mL | Freq: Once | OROMUCOSAL | Status: AC

## 2021-07-20 MED ORDER — KETAMINE HCL 50 MG/5ML IJ SOSY
PREFILLED_SYRINGE | INTRAMUSCULAR | Status: AC
  Filled 2021-07-20: qty 5

## 2021-07-20 MED ORDER — FAMOTIDINE 20 MG PO TABS: 20.0000 mg | ORAL_TABLET | Freq: Once | ORAL | Status: DC

## 2021-07-20 MED ORDER — LACTATED RINGERS IV SOLN: INTRAVENOUS | Status: DC

## 2021-07-20 MED ORDER — PROPOFOL 1000 MG/100ML IV EMUL
INTRAVENOUS | Status: AC
  Filled 2021-07-20: qty 100

## 2021-07-20 MED ORDER — FENTANYL CITRATE (PF) 100 MCG/2ML IJ SOLN
INTRAMUSCULAR | Status: AC
Start: 2021-07-20 — End: ?
  Filled 2021-07-20: qty 2

## 2021-07-20 MED ORDER — EPHEDRINE SULFATE (PRESSORS) 50 MG/ML IJ SOLN
INTRAMUSCULAR | Status: DC | PRN
  Administered 2021-07-20: 5 mg via INTRAVENOUS
  Administered 2021-07-20: 10 mg via INTRAVENOUS

## 2021-07-20 MED ORDER — APREPITANT 40 MG PO CAPS
40.0000 mg | ORAL_CAPSULE | Freq: Once | ORAL | Status: AC
  Administered 2021-07-20: 40 mg via ORAL

## 2021-07-20 MED ORDER — ACETAMINOPHEN 10 MG/ML IV SOLN
INTRAVENOUS | Status: DC | PRN
Start: 2021-07-20 — End: 2021-07-20
  Administered 2021-07-20: 1000 mg via INTRAVENOUS

## 2021-07-20 MED ORDER — ONDANSETRON HCL 4 MG/2ML IJ SOLN
INTRAMUSCULAR | Status: DC | PRN
  Administered 2021-07-20: 4 mg via INTRAVENOUS

## 2021-07-20 MED ORDER — PROPOFOL 10 MG/ML IV BOLUS
INTRAVENOUS | Status: DC | PRN
  Administered 2021-07-20: 150 mg via INTRAVENOUS

## 2021-07-20 MED ORDER — ACETAMINOPHEN 10 MG/ML IV SOLN
INTRAVENOUS | Status: AC
  Filled 2021-07-20: qty 100

## 2021-07-20 MED ORDER — SODIUM CHLORIDE 0.9 % IV SOLN: INTRAVENOUS | Status: DC

## 2021-07-20 MED ORDER — METOCLOPRAMIDE HCL 10 MG PO TABS: 5.0000 mg | ORAL_TABLET | Freq: Three times a day (TID) | ORAL | Status: DC | PRN

## 2021-07-20 MED ORDER — MIDAZOLAM HCL 2 MG/2ML IJ SOLN
INTRAMUSCULAR | Status: DC | PRN
Start: 2021-07-20 — End: 2021-07-20
  Administered 2021-07-20: 2 mg via INTRAVENOUS

## 2021-07-20 MED ORDER — CHLORHEXIDINE GLUCONATE 0.12 % MT SOLN
OROMUCOSAL | Status: AC
  Filled 2021-07-20: qty 15

## 2021-07-20 MED ORDER — ONDANSETRON HCL 4 MG/2ML IJ SOLN: 4.0000 mg | Freq: Four times a day (QID) | INTRAMUSCULAR | Status: DC | PRN

## 2021-07-20 MED ORDER — GLYCOPYRROLATE 0.2 MG/ML IJ SOLN
INTRAMUSCULAR | Status: DC | PRN
  Administered 2021-07-20: .2 mg via INTRAVENOUS

## 2021-07-20 MED ORDER — PHENYLEPHRINE 40 MCG/ML (10ML) SYRINGE FOR IV PUSH (FOR BLOOD PRESSURE SUPPORT)
PREFILLED_SYRINGE | INTRAVENOUS | Status: AC
  Filled 2021-07-20: qty 10

## 2021-07-20 MED ORDER — CEFAZOLIN IN SODIUM CHLORIDE 3-0.9 GM/100ML-% IV SOLN
3.0000 g | INTRAVENOUS | Status: AC
  Administered 2021-07-20: 3 g via INTRAVENOUS
  Filled 2021-07-20: qty 100

## 2021-07-20 MED ORDER — APREPITANT 40 MG PO CAPS
ORAL_CAPSULE | ORAL | Status: AC
  Filled 2021-07-20: qty 1

## 2021-07-20 MED ORDER — DEXMEDETOMIDINE (PRECEDEX) IN NS 20 MCG/5ML (4 MCG/ML) IV SYRINGE
PREFILLED_SYRINGE | INTRAVENOUS | Status: DC | PRN
  Administered 2021-07-20 (×2): 8 ug via INTRAVENOUS
  Administered 2021-07-20: 12 ug via INTRAVENOUS
  Administered 2021-07-20: 8 ug via INTRAVENOUS

## 2021-07-20 MED ORDER — PHENYLEPHRINE 40 MCG/ML (10ML) SYRINGE FOR IV PUSH (FOR BLOOD PRESSURE SUPPORT)
PREFILLED_SYRINGE | INTRAVENOUS | Status: DC | PRN
  Administered 2021-07-20: 120 ug via INTRAVENOUS

## 2021-07-20 MED ORDER — BUPIVACAINE-EPINEPHRINE (PF) 0.5% -1:200000 IJ SOLN
INTRAMUSCULAR | Status: DC | PRN
  Administered 2021-07-20: 20 mL

## 2021-07-20 MED ORDER — MENTHOL 3 MG MT LOZG
1.0000 | LOZENGE | OROMUCOSAL | Status: DC | PRN
  Filled 2021-07-20: qty 9

## 2021-07-20 MED ORDER — ACETAMINOPHEN-CODEINE #3 300-30 MG PO TABS: 1.0000 | ORAL_TABLET | Freq: Four times a day (QID) | ORAL | 0 refills | Status: DC | PRN

## 2021-07-20 MED ORDER — METOCLOPRAMIDE HCL 5 MG/ML IJ SOLN: 5.0000 mg | Freq: Three times a day (TID) | INTRAMUSCULAR | Status: DC | PRN

## 2021-07-20 MED ORDER — FENTANYL CITRATE (PF) 100 MCG/2ML IJ SOLN
INTRAMUSCULAR | Status: DC | PRN
  Administered 2021-07-20 (×3): 25 ug via INTRAVENOUS

## 2021-07-20 MED ORDER — ONDANSETRON HCL 4 MG/2ML IJ SOLN: 4.0000 mg | Freq: Once | INTRAMUSCULAR | Status: DC | PRN

## 2021-07-20 SURGICAL SUPPLY — 60 items
APL PRP STRL LF DISP 70% ISPRP (MISCELLANEOUS) ×2
BLADE INCISOR PLUS 4.5 (BLADE) ×2 IMPLANT
BNDG ELASTIC 4X5.8 VLCR STR LF (GAUZE/BANDAGES/DRESSINGS) IMPLANT
BUR ABRADER 4.0 W/FLUTE AQUA (MISCELLANEOUS) ×1 IMPLANT
BURR ABRADER 4.0 W/FLUTE AQUA (MISCELLANEOUS) ×3
CHLORAPREP W/TINT 26 (MISCELLANEOUS) ×3 IMPLANT
CUFF TOURN SGL QUICK 24 (TOURNIQUET CUFF)
CUFF TOURN SGL QUICK 34 (TOURNIQUET CUFF)
CUFF TRNQT CYL 24X4X16.5-23 (TOURNIQUET CUFF) IMPLANT
CUFF TRNQT CYL 34X4.125X (TOURNIQUET CUFF) IMPLANT
DEVICE SUCT BLK HOLE OR FLOOR (MISCELLANEOUS) ×2 IMPLANT
DRAPE ARTHRO LIMB 89X125 STRL (DRAPES) ×3 IMPLANT
DRAPE C-ARM XRAY 36X54 (DRAPES) ×1 IMPLANT
DRAPE C-ARMOR (DRAPES) ×1 IMPLANT
DRAPE FLUOR MINI C-ARM 54X84 (DRAPES) ×2 IMPLANT
DRAPE INCISE IOBAN 66X45 STRL (DRAPES) ×1 IMPLANT
DRSG EMULSION OIL 3X8 NADH (GAUZE/BANDAGES/DRESSINGS) ×3 IMPLANT
ELECT CAUTERY BLADE 6.4 (BLADE) ×3 IMPLANT
ELECT REM PT RETURN 9FT ADLT (ELECTROSURGICAL) ×3
ELECTRODE REM PT RTRN 9FT ADLT (ELECTROSURGICAL) ×2 IMPLANT
GAUZE SPONGE 4X4 12PLY STRL (GAUZE/BANDAGES/DRESSINGS) ×3 IMPLANT
GAUZE XEROFORM 1X8 LF (GAUZE/BANDAGES/DRESSINGS) ×3 IMPLANT
GLOVE SURG SYN 9.0  PF PI (GLOVE) ×1
GLOVE SURG SYN 9.0 PF PI (GLOVE) ×2 IMPLANT
GOWN SRG 2XL LVL 4 RGLN SLV (GOWNS) ×2 IMPLANT
GOWN STRL NON-REIN 2XL LVL4 (GOWNS) ×3
GOWN STRL REUS W/ TWL LRG LVL3 (GOWN DISPOSABLE) ×4 IMPLANT
GOWN STRL REUS W/TWL LRG LVL3 (GOWN DISPOSABLE) ×6
HANDLE YANKAUER SUCT BULB TIP (MISCELLANEOUS) ×1 IMPLANT
IV LACTATED RINGER IRRG 3000ML (IV SOLUTION) ×24
IV LR IRRIG 3000ML ARTHROMATIC (IV SOLUTION) ×10 IMPLANT
KIT TURNOVER KIT A (KITS) ×3 IMPLANT
MANIFOLD NEPTUNE II (INSTRUMENTS) ×4 IMPLANT
NDL FILTER BLUNT 18X1 1/2 (NEEDLE) ×1 IMPLANT
NEEDLE FILTER BLUNT 18X 1/2SAF (NEEDLE)
NEEDLE FILTER BLUNT 18X1 1/2 (NEEDLE) IMPLANT
NEEDLE HYPO 22GX1.5 SAFETY (NEEDLE) ×3 IMPLANT
NS IRRIG 1000ML POUR BTL (IV SOLUTION) ×1 IMPLANT
PACK ARTHROSCOPY KNEE (MISCELLANEOUS) ×3 IMPLANT
PACK EXTREMITY ARMC (MISCELLANEOUS) ×3 IMPLANT
PAD ABD DERMACEA PRESS 5X9 (GAUZE/BANDAGES/DRESSINGS) ×6 IMPLANT
SCALPEL PROTECTED #11 DISP (BLADE) ×3 IMPLANT
SCALPEL PROTECTED #15 DISP (BLADE) ×6 IMPLANT
SPONGE T-LAP 18X18 ~~LOC~~+RFID (SPONGE) ×1 IMPLANT
STAPLER SKIN PROX 35W (STAPLE) ×3 IMPLANT
SUT ETHIBOND NAB CT1 #1 30IN (SUTURE) ×1 IMPLANT
SUT ETHILON 3-0 FS-10 30 BLK (SUTURE) ×3
SUT ETHILON 4-0 (SUTURE) ×3
SUT ETHILON 4-0 FS2 18XMFL BLK (SUTURE) ×2
SUT VIC AB 0 CT1 36 (SUTURE) ×3 IMPLANT
SUT VIC AB 2-0 CT1 27 (SUTURE) ×3
SUT VIC AB 2-0 CT1 TAPERPNT 27 (SUTURE) ×2 IMPLANT
SUTURE EHLN 3-0 FS-10 30 BLK (SUTURE) ×2 IMPLANT
SUTURE ETHLN 4-0 FS2 18XMF BLK (SUTURE) ×2 IMPLANT
SYR 10ML LL (SYRINGE) ×3 IMPLANT
TUBING INFLOW SET DBFLO PUMP (TUBING) ×3 IMPLANT
TUBING OUTFLOW SET DBLFO PUMP (TUBING) ×3 IMPLANT
WAND COBLATION FLOW 50 (SURGICAL WAND) ×2 IMPLANT
WATER STERILE IRR 1000ML POUR (IV SOLUTION) ×1 IMPLANT
WATER STERILE IRR 500ML POUR (IV SOLUTION) ×3 IMPLANT

## 2021-07-20 NOTE — Op Note (Signed)
07/20/2021 ? ?4:53 PM ? ?PATIENT:  Diane Small  66 y.o. female ? ?PRE-OPERATIVE DIAGNOSIS:  Chronic pain of left knee  M25.562, G89.29 ? ?POST-OPERATIVE DIAGNOSIS:  loose hardware ? ?PROCEDURE:  Procedure(s): ?Left knee arthroscopy and attempted loose foreign body removal (Left) ? ?SURGEON: Leitha Schuller, MD ? ?ASSISTANTS: None ? ?ANESTHESIA:   general ? ?EBL:  Total I/O ?In: 600 [I.V.:600] ?Out: -  ? ?BLOOD ADMINISTERED:none ? ?DRAINS: none  ? ?LOCAL MEDICATIONS USED:  MARCAINE    ? ?SPECIMEN:  No Specimen ? ?DISPOSITION OF SPECIMEN:  N/A ? ?COUNTS:  YES ? ?TOURNIQUET: No tourniquet used ? ?IMPLANTS: None ? ?DICTATION: .Dragon Dictation patient was placed in the operative table and after general anesthesia was obtained the left leg was prepped and draped in a sterile fashion.  After patient identification and timeout procedures were completed an inferior lateral portal was made and the arthroscope was introduced.  Initial inspection revealed a great deal of scar tissue in the notch inferior medial portal was made and ArthroCare wand was used to ablate this so the anterior plastic could be identified as well as getting rid of some of the scar tissue impinging on the medial lateral joint space.  The setscrew was flushed with the plastic and could not be pulled out the remainder of the case approximately 2 hours was spent trying to get the screw out first using to temporize and use a screwdriver from superiorly through anterior superior medial portal this was unsuccessful use of angled hemostat and eventually used a bur to remove some of the plastic to try to get to the metal none these attempts were successful a small osteotome was used to try to pry the screw up and again this was unsuccessful by the close the case and seem like the screw was stuck under the plastic and could not be removed without arthrotomy which the patient had not agreed to preoperatively.  The knee was irrigated till clear with pre and  post procedure pictures taken just some partial synovitis removed but the loose body was not entirely loose and was left in place.  The wounds were closed with 2-0 Vicryl subcutaneously skin staples with 20 cc of half percent Sensorcaine with epinephrine injected into the incisions.  Xeroform 4 x 4 web roll and Ace wrap applied ? ?PLAN OF CARE: Discharge to home after PACU ? ?PATIENT DISPOSITION:  PACU - hemodynamically stable. ?  ? ?

## 2021-07-20 NOTE — Anesthesia Procedure Notes (Signed)
Procedure Name: LMA Insertion ?Date/Time: 07/20/2021 2:39 PM ?Performed by: Cheral Bay, CRNA ?Pre-anesthesia Checklist: Patient identified, Emergency Drugs available, Suction available and Patient being monitored ?Patient Re-evaluated:Patient Re-evaluated prior to induction ?Oxygen Delivery Method: Circle system utilized ?Preoxygenation: Pre-oxygenation with 100% oxygen ?Induction Type: IV induction ?Ventilation: Mask ventilation without difficulty ?LMA: LMA inserted ?LMA Size: 4.0 ?Tube type: Oral ?Number of attempts: 1 ?Placement Confirmation: positive ETCO2 and breath sounds checked- equal and bilateral ?Tube secured with: Tape ?Dental Injury: Teeth and Oropharynx as per pre-operative assessment  ? ? ? ? ?

## 2021-07-20 NOTE — Discharge Instructions (Addendum)

## 2021-07-20 NOTE — Anesthesia Preprocedure Evaluation (Addendum)
Anesthesia Evaluation  ?Patient identified by MRN, date of birth, ID band ?Patient awake ? ? ? ?Reviewed: ?Allergy & Precautions, NPO status , Patient's Chart, lab work & pertinent test results ? ?History of Anesthesia Complications ?(+) PONV, Family history of anesthesia reaction and history of anesthetic complications ? ?Airway ?Mallampati: II ? ?TM Distance: >3 FB ?Neck ROM: Full ? ? ? Dental ? ?(+) Teeth Intact, Dental Advidsory Given ?  ?Pulmonary ?neg shortness of breath, asthma , sleep apnea , COPD,  COPD inhaler, neg recent URI, Patient abstained from smoking.Not current smoker,  ?Recent asthma diagnosis, took inhaler today, breathing feels at baseline. Never hospitalized for asthma. ?Has OSA, does not use CPAP. Says it "didn't work" ?  ?Pulmonary exam normal ?breath sounds clear to auscultation ? ? ? ? ? ? Cardiovascular ?Exercise Tolerance: Poor ?METS: 3 - Mets hypertension, Pt. on medications ?(-) angina(-) CAD, (-) Past MI and (-) Cardiac Stents (-) dysrhythmias (-) Valvular Problems/Murmurs ?Rhythm:Regular Rate:Normal ?- Systolic murmurs ? ?  ?Neuro/Psych ? Headaches, neg Seizures PSYCHIATRIC DISORDERS Anxiety Depression   ? GI/Hepatic ?GERD  Medicated and Controlled,(+)  ?  ? (-) substance abuse ? ,   ?Endo/Other  ?neg diabetesMorbid obesity ? Renal/GU ?negative Renal ROS  ? ?  ?Musculoskeletal ? ?(+) Arthritis ,  ? Abdominal ?(+) + obese,   ?Peds ? Hematology ?  ?Anesthesia Other Findings ?Past Medical History: ?No date: Allergy ?No date: Anxiety ?No date: Arthritis ?    Comment:  knees ?No date: Asthma ?No date: Complication of anesthesia ?    Comment:  slow to wake after C-Section ?No date: COPD (chronic obstructive pulmonary disease) (HCC) ?No date: Depression ?No date: Family history of adverse reaction to anesthesia ?    Comment:  N/V ?No date: GERD (gastroesophageal reflux disease) ?No date: Glaucoma ?No date: H/O cervical fracture ?    Comment:  age 66, no  residual deficits ?No date: Hypertension ?No date: Pneumonia ?No date: PONV (postoperative nausea and vomiting) ?No date: Sinus headache ?No date: Sleep apnea ? Reproductive/Obstetrics ? ?  ? ? ? ? ? ? ? ? ? ? ? ? ? ?  ?  ? ? ? ? ? ? ? ? ?Anesthesia Physical ? ?Anesthesia Plan ? ?ASA: 3 ? ?Anesthesia Plan: General  ? ?Post-op Pain Management: Ofirmev IV (intra-op)  ? ?Induction: Intravenous ? ?PONV Risk Score and Plan: 3 and Ondansetron, Dexamethasone, Midazolam, Aprepitant and Treatment may vary due to age or medical condition ? ?Airway Management Planned: Oral ETT and LMA ? ?Additional Equipment: None ? ?Intra-op Plan:  ? ?Post-operative Plan: Extubation in OR ? ?Informed Consent: I have reviewed the patients History and Physical, chart, labs and discussed the procedure including the risks, benefits and alternatives for the proposed anesthesia with the patient or authorized representative who has indicated his/her understanding and acceptance.  ? ? ? ? ? ?Plan Discussed with: CRNA and Surgeon ? ?Anesthesia Plan Comments:   ? ? ? ? ? ?Anesthesia Quick Evaluation ? ?
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
## 2021-07-20 NOTE — Transfer of Care (Signed)
Immediate Anesthesia Transfer of Care Note ? ?Patient: Diane Small ? ?Procedure(s) Performed: Left knee arthroscopy and attempted loose foreign body removal (Left: Knee) ? ?Patient Location: PACU ? ?Anesthesia Type:General ? ?Level of Consciousness: drowsy ? ?Airway & Oxygen Therapy: Patient Spontanous Breathing and Patient connected to face mask oxygen ? ?Post-op Assessment: Report given to RN and Post -op Vital signs reviewed and stable ? ?Post vital signs: Reviewed and stable ? ?Last Vitals:  ?Vitals Value Taken Time  ?BP 131/76 07/20/21 1653  ?Temp    ?Pulse 68 07/20/21 1659  ?Resp 21 07/20/21 1659  ?SpO2 99 % 07/20/21 1659  ?Vitals shown include unvalidated device data. ? ?Last Pain:  ?Vitals:  ? 07/20/21 1350  ?TempSrc: Oral  ?PainSc: 0-No pain  ?   ? ?  ? ?Complications: No notable events documented. ?

## 2021-07-21 ENCOUNTER — Encounter: Payer: Self-pay | Admitting: Orthopedic Surgery

## 2021-07-21 NOTE — Anesthesia Postprocedure Evaluation (Signed)
Anesthesia Post Note ? ?Patient: LUCCIANA HEAD ? ?Procedure(s) Performed: Left knee arthroscopy and attempted loose foreign body removal (Left: Knee) ? ?Patient location during evaluation: PACU ?Anesthesia Type: General ?Level of consciousness: awake and alert ?Pain management: pain level controlled ?Vital Signs Assessment: post-procedure vital signs reviewed and stable ?Respiratory status: spontaneous breathing, nonlabored ventilation and respiratory function stable ?Cardiovascular status: blood pressure returned to baseline and stable ?Postop Assessment: no apparent nausea or vomiting ?Anesthetic complications: no ? ? ?No notable events documented. ? ? ?Last Vitals:  ?Vitals:  ? 07/20/21 1730 07/20/21 1748  ?BP: 116/88 (!) 154/67  ?Pulse: 65   ?Resp: 19 18  ?Temp: (!) 36.1 ?C   ?SpO2: 99% 100%  ?  ?Last Pain:  ?Vitals:  ? 07/20/21 1748  ?TempSrc:   ?PainSc: 0-No pain  ? ? ?  ?  ?  ?  ?  ?  ? ?Foye Deer ? ? ? ? ?

## 2021-08-03 ENCOUNTER — Ambulatory Visit
Admission: RE | Admit: 2021-08-03 | Discharge: 2021-08-03 | Disposition: A | Discharge status: Home / Self Care | Payer: Medicare Other | Source: Ambulatory Visit | Attending: Family Medicine | Admitting: Family Medicine

## 2021-08-03 DIAGNOSIS — Z1231 Encounter for screening mammogram for malignant neoplasm of breast: Secondary | ICD-10-CM | POA: Insufficient documentation

## 2021-08-07 ENCOUNTER — Other Ambulatory Visit: Payer: Self-pay | Admitting: Family Medicine

## 2021-08-07 DIAGNOSIS — I10 Essential (primary) hypertension: Secondary | ICD-10-CM

## 2021-10-03 ENCOUNTER — Other Ambulatory Visit: Payer: Self-pay | Admitting: Family Medicine

## 2021-10-03 DIAGNOSIS — K219 Gastro-esophageal reflux disease without esophagitis: Secondary | ICD-10-CM

## 2021-10-04 NOTE — Telephone Encounter (Signed)
Requested Prescriptions  Pending Prescriptions Disp Refills  . dexlansoprazole (DEXILANT) 60 MG capsule [Pharmacy Med Name: DEXLANSOPRAZOLE DR 60 MG CAP] 90 capsule 0    Sig: TAKE 1 CAPSULE BY MOUTH EVERY DAY     Gastroenterology: Proton Pump Inhibitors Passed - 10/03/2021  8:24 PM      Passed - Valid encounter within last 12 months    Recent Outpatient Visits          3 months ago Essential hypertension   [facility] Duanne Limerick, MD   5 months ago Essential hypertension   [facility] Duanne Limerick, MD   12 months ago Rotator cuff tendinitis, right   [facility] Jerrol Banana, MD   1 year ago Essential hypertension   [facility] Duanne Limerick, MD   1 year ago Annual physical exam   [facility] Duanne Limerick, MD      Future Appointments            In 2 weeks Duanne Limerick, MD [facility]

## 2021-10-10 ENCOUNTER — Other Ambulatory Visit: Payer: Self-pay | Admitting: Family Medicine

## 2021-10-10 DIAGNOSIS — J4520 Mild intermittent asthma, uncomplicated: Secondary | ICD-10-CM

## 2021-10-24 ENCOUNTER — Ambulatory Visit (INDEPENDENT_AMBULATORY_CARE_PROVIDER_SITE_OTHER): Payer: Medicare Other | Admitting: Family Medicine

## 2021-10-24 ENCOUNTER — Encounter: Payer: Self-pay | Admitting: Family Medicine

## 2021-10-24 VITALS — BP 122/80 | HR 64 | Ht 63.0 in | Wt 262.0 lb

## 2021-10-24 DIAGNOSIS — J4520 Mild intermittent asthma, uncomplicated: Secondary | ICD-10-CM

## 2021-10-24 DIAGNOSIS — K219 Gastro-esophageal reflux disease without esophagitis: Secondary | ICD-10-CM

## 2021-10-24 DIAGNOSIS — I10 Essential (primary) hypertension: Secondary | ICD-10-CM | POA: Diagnosis not present

## 2021-10-24 DIAGNOSIS — E7800 Pure hypercholesterolemia, unspecified: Secondary | ICD-10-CM | POA: Diagnosis not present

## 2021-10-24 MED ORDER — MONTELUKAST SODIUM 10 MG PO TABS: 10.0000 mg | ORAL_TABLET | Freq: Every day | ORAL | 1 refills | Status: DC

## 2021-10-24 MED ORDER — HYDROCHLOROTHIAZIDE 25 MG PO TABS: 25.0000 mg | ORAL_TABLET | Freq: Every day | ORAL | 1 refills | Status: DC

## 2021-10-24 MED ORDER — TELMISARTAN 40 MG PO TABS: 40.0000 mg | ORAL_TABLET | Freq: Every day | ORAL | 1 refills | Status: DC

## 2021-10-24 MED ORDER — DEXLANSOPRAZOLE 60 MG PO CPDR: 60.0000 mg | DELAYED_RELEASE_CAPSULE | Freq: Every day | ORAL | 1 refills | Status: DC

## 2021-10-24 MED ORDER — ALBUTEROL SULFATE HFA 108 (90 BASE) MCG/ACT IN AERS: INHALATION_SPRAY | RESPIRATORY_TRACT | 2 refills | Status: DC

## 2021-10-25 LAB — LIPID PANEL WITH LDL/HDL RATIO
Cholesterol, Total: 177 mg/dL (ref 100–199)
HDL: 45 mg/dL (ref >39)
LDL Chol Calc (NIH): 109 mg/dL — ABNORMAL HIGH (ref 0–99)
LDL/HDL Ratio: 2.4 ratio (ref 0.0–3.2)
Triglycerides: 128 mg/dL (ref 0–149)
VLDL Cholesterol Cal: 23 mg/dL (ref 5–40)

## 2021-10-30 DIAGNOSIS — T8484XA Pain due to internal orthopedic prosthetic devices, implants and grafts, initial encounter: Secondary | ICD-10-CM | POA: Diagnosis not present

## 2021-10-30 DIAGNOSIS — Z96651 Presence of right artificial knee joint: Secondary | ICD-10-CM | POA: Diagnosis not present

## 2021-10-30 DIAGNOSIS — Z9889 Other specified postprocedural states: Secondary | ICD-10-CM | POA: Diagnosis not present

## 2021-11-08 ENCOUNTER — Ambulatory Visit
Admission: RE | Admit: 2021-11-08 | Discharge: 2021-11-08 | Disposition: A | Discharge status: Home / Self Care | Payer: Medicare Other | Source: Ambulatory Visit | Attending: Orthopedic Surgery | Admitting: Orthopedic Surgery

## 2021-11-08 ENCOUNTER — Other Ambulatory Visit: Payer: Self-pay | Admitting: Orthopedic Surgery

## 2021-11-08 DIAGNOSIS — T8484XA Pain due to internal orthopedic prosthetic devices, implants and grafts, initial encounter: Secondary | ICD-10-CM | POA: Diagnosis not present

## 2021-11-08 DIAGNOSIS — M25461 Effusion, right knee: Secondary | ICD-10-CM | POA: Diagnosis not present

## 2021-11-08 DIAGNOSIS — M25561 Pain in right knee: Secondary | ICD-10-CM | POA: Insufficient documentation

## 2021-11-08 DIAGNOSIS — Z96651 Presence of right artificial knee joint: Secondary | ICD-10-CM | POA: Diagnosis not present

## 2021-11-08 DIAGNOSIS — M7121 Synovial cyst of popliteal space [Baker], right knee: Secondary | ICD-10-CM | POA: Diagnosis not present

## 2021-11-13 DIAGNOSIS — Z96652 Presence of left artificial knee joint: Secondary | ICD-10-CM | POA: Diagnosis not present

## 2021-11-13 DIAGNOSIS — Z96651 Presence of right artificial knee joint: Secondary | ICD-10-CM | POA: Diagnosis not present

## 2021-11-17 DIAGNOSIS — I10 Essential (primary) hypertension: Secondary | ICD-10-CM | POA: Diagnosis not present

## 2021-11-17 DIAGNOSIS — Z713 Dietary counseling and surveillance: Secondary | ICD-10-CM | POA: Diagnosis not present

## 2021-11-17 DIAGNOSIS — Z7985 Long-term (current) use of injectable non-insulin antidiabetic drugs: Secondary | ICD-10-CM | POA: Diagnosis not present

## 2021-11-17 DIAGNOSIS — M170 Bilateral primary osteoarthritis of knee: Secondary | ICD-10-CM | POA: Diagnosis not present

## 2021-11-17 DIAGNOSIS — E782 Mixed hyperlipidemia: Secondary | ICD-10-CM | POA: Diagnosis not present

## 2021-11-17 DIAGNOSIS — G4733 Obstructive sleep apnea (adult) (pediatric): Secondary | ICD-10-CM | POA: Diagnosis not present

## 2021-11-27 ENCOUNTER — Ambulatory Visit: Payer: Medicare Other | Admitting: Emergency Medicine

## 2021-11-27 ENCOUNTER — Encounter: Payer: Self-pay | Admitting: Emergency Medicine

## 2021-11-27 DIAGNOSIS — Z0000 Encounter for general adult medical examination without abnormal findings: Secondary | ICD-10-CM

## 2021-11-27 NOTE — Patient Instructions (Addendum)
It was a pleasure talking with you last Monday!    Health Maintenance, Female Adopting a healthy lifestyle and getting preventive care are important in promoting health and wellness. Ask your health care provider about: The right schedule for you to have regular tests and exams. Things you can do on your own to prevent diseases and keep yourself healthy. What should I know about diet, weight, and exercise? Eat a healthy diet  Eat a diet that includes plenty of vegetables, fruits, low-fat dairy products, and lean protein. Do not eat a lot of foods that are high in solid fats, added sugars, or sodium. Maintain a healthy weight Body mass index (BMI) is used to identify weight problems. It estimates body fat based on height and weight. Your health care provider can help determine your BMI and help you achieve or maintain a healthy weight. Get regular exercise Get regular exercise. This is one of the most important things you can do for your health. Most adults should: Exercise for at least 150 minutes each week. The exercise should increase your heart rate and make you sweat (moderate-intensity exercise). Do strengthening exercises at least twice a week. This is in addition to the moderate-intensity exercise. Spend less time sitting. Even light physical activity can be beneficial. Watch cholesterol and blood lipids Have your blood tested for lipids and cholesterol at 66 years of age, then have this test every 5 years. Have your cholesterol levels checked more often if: Your lipid or cholesterol levels are high. You are older than 66 years of age. You are at high risk for heart disease. What should I know about cancer screening? Depending on your health history and family history, you may need to have cancer screening at various ages. This may include screening for: Breast cancer. Cervical cancer. Colorectal cancer. Skin cancer. Lung cancer. What should I know about heart disease,  diabetes, and high blood pressure? Blood pressure and heart disease High blood pressure causes heart disease and increases the risk of stroke. This is more likely to develop in people who have high blood pressure readings or are overweight. Have your blood pressure checked: Every 3-5 years if you are 41-69 years of age. Every year if you are 17 years old or older. Diabetes Have regular diabetes screenings. This checks your fasting blood sugar level. Have the screening done: Once every three years after age 39 if you are at a normal weight and have a low risk for diabetes. More often and at a younger age if you are overweight or have a high risk for diabetes. What should I know about preventing infection? Hepatitis B If you have a higher risk for hepatitis B, you should be screened for this virus. Talk with your health care provider to find out if you are at risk for hepatitis B infection. Hepatitis C Testing is recommended for: Everyone born from [birth year] through 1965. Anyone with known risk factors for hepatitis C. Sexually transmitted infections (STIs) Get screened for STIs, including gonorrhea and chlamydia, if: You are sexually active and are younger than 66 years of age. You are older than 66 years of age and your health care provider tells you that you are at risk for this type of infection. Your sexual activity has changed since you were last screened, and you are at increased risk for chlamydia or gonorrhea. Ask your health care provider if you are at risk. Ask your health care provider about whether you are at high risk for HIV. Your  health care provider may recommend a prescription medicine to help prevent HIV infection. If you choose to take medicine to prevent HIV, you should first get tested for HIV. You should then be tested every 3 months for as long as you are taking the medicine. Pregnancy If you are about to stop having your period (premenopausal) and you may become pregnant,  seek counseling before you get pregnant. Take 400 to 800 micrograms (mcg) of folic acid every day if you become pregnant. Ask for birth control (contraception) if you want to prevent pregnancy. Osteoporosis and menopause Osteoporosis is a disease in which the bones lose minerals and strength with aging. This can result in bone fractures. If you are 69 years old or older, or if you are at risk for osteoporosis and fractures, ask your health care provider if you should: Be screened for bone loss. Take a calcium or vitamin D supplement to lower your risk of fractures. Be given hormone replacement therapy (HRT) to treat symptoms of menopause. Follow these instructions at home: Alcohol use Do not drink alcohol if: Your health care provider tells you not to drink. You are pregnant, may be pregnant, or are planning to become pregnant. If you drink alcohol: Limit how much you have to: 0-1 drink a day. Know how much alcohol is in your drink. In the [place] one drink equals one 12 oz bottle of beer (355 mL), one 5 oz glass of wine (148 mL), or one 1 oz glass of hard liquor (44 mL). Lifestyle Do not use any products that contain nicotine or tobacco. These products include cigarettes, chewing tobacco, and vaping devices, such as e-cigarettes. If you need help quitting, ask your health care provider. Do not use street drugs. Do not share needles. Ask your health care provider for help if you need support or information about quitting drugs. General instructions Schedule regular health, dental, and eye exams. Stay current with your vaccines. Tell your health care provider if: You often feel depressed. You have ever been abused or do not feel safe at home. Summary Adopting a healthy lifestyle and getting preventive care are important in promoting health and wellness. Follow your health care provider's instructions about healthy diet, exercising, and getting tested or screened for diseases. Follow your  health care provider's instructions on monitoring your cholesterol and blood pressure. This information is not intended to replace advice given to you by your health care provider. Make sure you discuss any questions you have with your health care provider. Document Revised: 09/05/2020 Document Reviewed: 09/05/2020 Elsevier Patient Education  2023 [employer].
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
## 2021-12-04 DIAGNOSIS — Z96652 Presence of left artificial knee joint: Secondary | ICD-10-CM | POA: Diagnosis not present

## 2021-12-04 DIAGNOSIS — Z96651 Presence of right artificial knee joint: Secondary | ICD-10-CM | POA: Diagnosis not present

## 2021-12-04 DIAGNOSIS — T85898A Other specified complication of other internal prosthetic devices, implants and grafts, initial encounter: Secondary | ICD-10-CM | POA: Diagnosis not present

## 2021-12-05 ENCOUNTER — Telehealth: Payer: Self-pay

## 2021-12-05 NOTE — Telephone Encounter (Signed)
Advised pt to go to [facility]. For assistance. Pt seemed confused as to how I received a message on her. She stated she "pays her bills if she don't eat."

## 2021-12-14 DIAGNOSIS — M25562 Pain in left knee: Secondary | ICD-10-CM | POA: Diagnosis not present

## 2021-12-14 DIAGNOSIS — M25561 Pain in right knee: Secondary | ICD-10-CM | POA: Diagnosis not present

## 2021-12-21 DIAGNOSIS — M25562 Pain in left knee: Secondary | ICD-10-CM | POA: Diagnosis not present

## 2021-12-21 DIAGNOSIS — M25561 Pain in right knee: Secondary | ICD-10-CM | POA: Diagnosis not present

## 2021-12-26 DIAGNOSIS — M25561 Pain in right knee: Secondary | ICD-10-CM | POA: Diagnosis not present

## 2021-12-26 DIAGNOSIS — M25562 Pain in left knee: Secondary | ICD-10-CM | POA: Diagnosis not present

## 2021-12-28 DIAGNOSIS — M25561 Pain in right knee: Secondary | ICD-10-CM | POA: Diagnosis not present

## 2021-12-28 DIAGNOSIS — M25562 Pain in left knee: Secondary | ICD-10-CM | POA: Diagnosis not present

## 2021-12-30 ENCOUNTER — Other Ambulatory Visit: Payer: Self-pay | Admitting: Family Medicine

## 2021-12-30 DIAGNOSIS — J4520 Mild intermittent asthma, uncomplicated: Secondary | ICD-10-CM

## 2022-01-02 DIAGNOSIS — M25561 Pain in right knee: Secondary | ICD-10-CM | POA: Diagnosis not present

## 2022-01-02 DIAGNOSIS — M25562 Pain in left knee: Secondary | ICD-10-CM | POA: Diagnosis not present

## 2022-01-02 NOTE — Telephone Encounter (Signed)
last RF 10/24/21 8.5 each 2 RF should have enough med to last 01/24/22  Requested Prescriptions  Refused Prescriptions Disp Refills  . albuterol (VENTOLIN HFA) 108 (90 Base) MCG/ACT inhaler [Pharmacy Med Name: ALBUTEROL HFA (PROAIR) INHALER] 8.5 each 2    Sig: INHALE 1-2 PUFFS BY MOUTH EVERY 6 HOURS AS NEEDED FOR WHEEZE OR SHORTNESS OF BREATH     Pulmonology:  [facility] 2 Passed - 12/30/2021  1:18 AM      Passed - Last BP in normal range    BP Readings from Last 1 Encounters:  10/24/21 122/80         Passed - Last Heart Rate in normal range    Pulse Readings from Last 1 Encounters:  10/24/21 64         Passed - Valid encounter within last 12 months    Recent Outpatient Visits          2 months ago Essential hypertension   [facility] [facility] Phineas Inches, MD   6 months ago Essential hypertension   [place] [facility] Phineas Inches, MD   8 months ago Essential hypertension   [place] [facility] Phineas Inches, MD   1 year ago Rotator cuff tendinitis, right   [facility]on, Ocie Bob, MD   1 year ago Essential hypertension   [place] [facility] Phineas Inches, MD      Future Appointments            In 3 months Duanne Limerick, MD [facility] at [facility]

## 2022-01-04 DIAGNOSIS — M25562 Pain in left knee: Secondary | ICD-10-CM | POA: Diagnosis not present

## 2022-01-04 DIAGNOSIS — Z713 Dietary counseling and surveillance: Secondary | ICD-10-CM | POA: Diagnosis not present

## 2022-01-04 DIAGNOSIS — M25561 Pain in right knee: Secondary | ICD-10-CM | POA: Diagnosis not present

## 2022-01-09 DIAGNOSIS — M25562 Pain in left knee: Secondary | ICD-10-CM | POA: Diagnosis not present

## 2022-01-09 DIAGNOSIS — M25561 Pain in right knee: Secondary | ICD-10-CM | POA: Diagnosis not present

## 2022-01-11 DIAGNOSIS — H401111 Primary open-angle glaucoma, right eye, mild stage: Secondary | ICD-10-CM | POA: Diagnosis not present

## 2022-01-12 DIAGNOSIS — M25561 Pain in right knee: Secondary | ICD-10-CM | POA: Diagnosis not present

## 2022-01-12 DIAGNOSIS — M25562 Pain in left knee: Secondary | ICD-10-CM | POA: Diagnosis not present

## 2022-01-21 ENCOUNTER — Other Ambulatory Visit: Payer: Self-pay | Admitting: Family Medicine

## 2022-01-21 DIAGNOSIS — J4520 Mild intermittent asthma, uncomplicated: Secondary | ICD-10-CM

## 2022-02-10 ENCOUNTER — Other Ambulatory Visit: Payer: Self-pay | Admitting: Family Medicine

## 2022-02-10 DIAGNOSIS — J4520 Mild intermittent asthma, uncomplicated: Secondary | ICD-10-CM

## 2022-03-16 DIAGNOSIS — Z713 Dietary counseling and surveillance: Secondary | ICD-10-CM | POA: Diagnosis not present

## 2022-03-16 DIAGNOSIS — I10 Essential (primary) hypertension: Secondary | ICD-10-CM | POA: Diagnosis not present

## 2022-03-23 ENCOUNTER — Ambulatory Visit: Payer: Self-pay | Admitting: *Deleted

## 2022-03-23 ENCOUNTER — Telehealth: Payer: Medicare Other | Admitting: Physician Assistant

## 2022-03-23 DIAGNOSIS — U071 COVID-19: Secondary | ICD-10-CM | POA: Diagnosis not present

## 2022-03-23 MED ORDER — FLUTICASONE PROPIONATE 50 MCG/ACT NA SUSP: 2.0000 | Freq: Every day | NASAL | 0 refills | Status: DC

## 2022-03-23 MED ORDER — BENZONATATE 100 MG PO CAPS: 100.0000 mg | ORAL_CAPSULE | Freq: Three times a day (TID) | ORAL | 0 refills | Status: DC | PRN

## 2022-03-23 MED ORDER — NIRMATRELVIR/RITONAVIR (PAXLOVID)TABLET: 3.0000 | ORAL_TABLET | Freq: Two times a day (BID) | ORAL | 0 refills | Status: AC

## 2022-03-23 NOTE — Patient Instructions (Signed)
Diane Small, thank you for joining Margaretann Loveless, PA-C for today's virtual visit.  While this provider is not your primary care provider (PCP), if your PCP is located in our provider database this encounter information will be shared with them immediately following your visit.   A [facility] MyChart account gives you access to today's visit and all your visits, tests, and labs performed at [facility] " click here if you don't have a [facility] MyChart account or go to [email or web address]  Consent: (Patient) Diane Small provided verbal consent for this virtual visit at the beginning of the encounter.  Current Medications:  Current Outpatient Medications:    benzonatate (TESSALON) 100 MG capsule, Take 1 capsule (100 mg total) by mouth 3 (three) times daily as needed., Disp: 30 capsule, Rfl: 0   fluticasone (FLONASE) 50 MCG/ACT nasal spray, Place 2 sprays into both nostrils daily., Disp: 16 g, Rfl: 0   nirmatrelvir/ritonavir EUA (PAXLOVID) 20 x 150 MG & 10 x 100MG  TABS, Take 3 tablets by mouth 2 (two) times daily for 5 days. (Take nirmatrelvir 150 mg two tablets twice daily for 5 days and ritonavir 100 mg one tablet twice daily for 5 days) Patient GFR is 73, Disp: 30 tablet, Rfl: 0   acetaminophen (TYLENOL) 650 MG CR tablet, Take 1,300 mg by mouth every 8 (eight) hours as needed for pain., Disp: , Rfl:    acetaminophen-codeine (TYLENOL #3) 300-30 MG tablet, Take 1-2 tablets by mouth every 6 (six) hours as needed for moderate pain., Disp: 30 tablet, Rfl: 0   albuterol (VENTOLIN HFA) 108 (90 Base) MCG/ACT inhaler, INHALE 1-2 PUFFS BY MOUTH EVERY 6 HOURS AS NEEDED FOR WHEEZE OR SHORTNESS OF BREATH, Disp: 8.5 each, Rfl: 2   ALPRAZolam (XANAX) 1 MG tablet, Take 2 mg by mouth at bedtime. psych, Disp: , Rfl:    aspirin EC 81 MG tablet, Take 81 mg by mouth daily. Swallow whole., Disp: , Rfl:    azelastine (ASTELIN) 0.1 % nasal spray, Place 1 spray into both nostrils  2 (two) times daily. Dr , Disp: , Rfl:    budesonide-formoterol (SYMBICORT) 160-4.5 MCG/ACT inhaler, Inhale 2 puffs into the lungs daily as needed (asthma). Dr Andee Poles, Disp: , Rfl:    Cranberry 4200 MG CAPS, Take by mouth., Disp: , Rfl:    dexlansoprazole (DEXILANT) 60 MG capsule, Take 1 capsule (60 mg total) by mouth daily., Disp: 90 capsule, Rfl: 1   escitalopram (LEXAPRO) 5 MG tablet, Take 5 mg by mouth daily. psych, Disp: , Rfl:    hydrochlorothiazide (HYDRODIURIL) 25 MG tablet, Take 1 tablet (25 mg total) by mouth daily., Disp: 90 tablet, Rfl: 1   latanoprost (XALATAN) 0.005 % ophthalmic solution, Place 1 drop into both eyes at bedtime. Dr Andee Poles, Disp: , Rfl:    montelukast (SINGULAIR) 10 MG tablet, TAKE 1 TABLET BY MOUTH EVERY DAY, Disp: 90 tablet, Rfl: 0   Omega-3 Fatty Acids (FISH OIL PO), Take 1 capsule by mouth daily., Disp: , Rfl:    Semaglutide,0.25 or 0.5MG /DOS, 2 MG/1.5ML SOPN, Inject into the skin., Disp: , Rfl:    telmisartan (MICARDIS) 40 MG tablet, Take 1 tablet (40 mg total) by mouth daily., Disp: 90 tablet, Rfl: 1   timolol (TIMOPTIC) 0.5 % ophthalmic solution, Place 1 drop into both eyes daily. Dr Inez Pilgrim, Disp: , Rfl:    traMADol (ULTRAM) 50 MG tablet, Take 1 tablet (50 mg total) by mouth every 6 (six) hours as needed for moderate pain  or severe pain. (Patient not taking: Reported on 10/24/2021), Disp: 40 tablet, Rfl: 1   Turmeric 500 MG CAPS, Take 500 mg by mouth daily., Disp: , Rfl:    Medications ordered in this encounter:  Meds ordered this encounter  Medications   nirmatrelvir/ritonavir EUA (PAXLOVID) 20 x 150 MG & 10 x 100MG  TABS    Sig: Take 3 tablets by mouth 2 (two) times daily for 5 days. (Take nirmatrelvir 150 mg two tablets twice daily for 5 days and ritonavir 100 mg one tablet twice daily for 5 days) Patient GFR is 73    Dispense:  30 tablet    Refill:  0    Order Specific Question:   Supervising Provider    Answer:   Merrilee JanskyLAMPTEY, PHILIP O [[id number]]    fluticasone (FLONASE) 50 MCG/ACT nasal spray    Sig: Place 2 sprays into both nostrils daily.    Dispense:  16 g    Refill:  0    Order Specific Question:   Supervising Provider    Answer:   Merrilee JanskyLAMPTEY, PHILIP O [[id number]]   benzonatate (TESSALON) 100 MG capsule    Sig: Take 1 capsule (100 mg total) by mouth 3 (three) times daily as needed.    Dispense:  30 capsule    Refill:  0    Order Specific Question:   Supervising Provider    Answer:   Merrilee JanskyLAMPTEY, PHILIP O [id number]]     *If you need refills on other medications prior to your next appointment, please contact your pharmacy*  Follow-Up: Call back or seek an in-person evaluation if the symptoms worsen or if the condition fails to improve as anticipated.  [facility] [phone]  Other Instructions  COVID-19 COVID-19, or coronavirus disease 2019, is an infection that is caused by a new (novel) coronavirus called SARS-CoV-2. COVID-19 can cause many symptoms. In some people, the virus may not cause any symptoms. In others, it may cause mild or severe symptoms. Some people with severe infection develop severe disease. What are the causes? This illness is caused by a virus. The virus may be in the air as tiny specks of fluid (aerosols) or droplets, or it may be on surfaces. You may catch the virus by: Breathing in droplets from an infected person. Droplets can be spread by a person breathing, speaking, singing, coughing, or sneezing. Touching something, like a table or a doorknob, that has virus on it (is contaminated) and then touching your mouth, nose, or eyes. What increases the risk? Risk for infection: You are more likely to get infected with the COVID-19 virus if: You are within 6 ft (1.8 m) of a person with COVID-19 for 15 minutes or longer. You are providing care for a person who is infected with COVID-19. You are in close personal contact with other people. Close personal contact includes hugging, kissing, or  sharing eating or drinking utensils. Risk for serious illness caused by COVID-19: You are more likely to get seriously ill from the COVID-19 virus if: You have cancer. You have a long-term (chronic) disease, such as: Chronic lung disease. This includes pulmonary embolism, chronic obstructive pulmonary disease, and cystic fibrosis. Long-term disease that lowers your body's ability to fight infection (immunocompromise). Serious cardiac conditions, such as heart failure, coronary artery disease, or cardiomyopathy. Diabetes. Chronic kidney disease. Liver diseases. These include cirrhosis, nonalcoholic fatty liver disease, alcoholic liver disease, or autoimmune hepatitis. You have obesity. You are pregnant or were recently pregnant. You have sickle cell  disease. What are the signs or symptoms? Symptoms of this condition can range from mild to severe. Symptoms may appear any time from 2 to 14 days after being exposed to the virus. They include: Fever or chills. Shortness of breath or trouble breathing. Feeling tired or very tired. Headaches, body aches, or muscle aches. Runny or stuffy nose, sneezing, coughing, or sore throat. New loss of taste or smell. This is rare. Some people may also have stomach problems, such as nausea, vomiting, or diarrhea. Other people may not have any symptoms of COVID-19. How is this diagnosed? This condition may be diagnosed by testing samples to check for the COVID-19 virus. The most common tests are the PCR test and the antigen test. Tests may be done in the lab or at home. They include: Using a swab to take a sample of fluid from the back of your nose and throat (nasopharyngeal fluid), from your nose, or from your throat. Testing a sample of saliva from your mouth. Testing a sample of coughed-up mucus from your lungs (sputum). How is this treated? Treatment for COVID-19 infection depends on the severity of the condition. Mild symptoms can be managed at home  with rest, fluids, and over-the-counter medicines. Serious symptoms may be treated in a hospital intensive care unit (ICU). Treatment in the ICU may include: Supplemental oxygen. Extra oxygen is given through a tube in the nose, a face mask, or a hood. Medicines. These may include: Antivirals, such as monoclonal antibodies. These help your body fight off certain viruses that can cause disease. Anti-inflammatories, such as corticosteroids. These reduce inflammation and suppress the immune system. Antithrombotics. These prevent or treat blood clots, if they develop. Convalescent plasma. This helps boost your immune system, if you have an underlying immunosuppressive condition or are getting immunosuppressive treatments. Prone positioning. This means you will lie on your stomach. This helps oxygen to get into your lungs. Infection control measures. If you are at risk for more serious illness caused by COVID-19, your health care provider may prescribe two long-acting monoclonal antibodies, given together every 6 months. How is this prevented? To protect yourself: Use preventive medicine (pre-exposure prophylaxis). You may get pre-exposure prophylaxis if you have moderate or severe immunocompromise. Get vaccinated. Anyone 54 months old or older who meets guidelines can get a COVID-19 vaccine or vaccine series. This includes people who are pregnant or making breast milk (lactating). Get an added dose of COVID-19 vaccine after your first vaccine or vaccine series if you have moderate to severe immunocompromise. This applies if you have had a solid organ transplant or have been diagnosed with an immunocompromising condition. You should get the added dose 4 weeks after you got the first COVID-19 vaccine or vaccine series. If you get an mRNA vaccine, you will need a 3-dose primary series. If you get the J&J/Janssen vaccine, you will need a 2-dose primary series, with the second dose being an mRNA  vaccine. Talk to your health care provider about getting experimental monoclonal antibodies. This treatment is approved under emergency use authorization to prevent severe illness before or after being exposed to the COVID-19 virus. You may be given monoclonal antibodies if: You have moderate or severe immunocompromise. This includes treatments that lower your immune response. People with immunocompromise may not develop protection against COVID-19 when they are vaccinated. You cannot be vaccinated. You may not get a vaccine if you have a severe allergic reaction to the vaccine or its components. You are not fully vaccinated. You are in  a facility where COVID-19 is present and: Are in close contact with a person who is infected with the COVID-19 virus. Are at high risk of being exposed to the COVID-19 virus. You are at risk of illness from new variants of the COVID-19 virus. To protect others: If you have symptoms of COVID-19, take steps to prevent the virus from spreading to others. Stay home. Leave your house only to get medical care. Do not use public transit, if possible. Do not travel while you are sick. Wash your hands often with soap and water for at least 20 seconds. If soap and water are not available, use alcohol-based hand sanitizer. Make sure that all people in your household wash their hands well and often. Cough or sneeze into a tissue or your sleeve or elbow. Do not cough or sneeze into your hand or into the air. Where to find more information Centers for Disease Control and Prevention: https://www.clark-whitaker.org/ World Health Organization: https://thompson-craig.com/ Get help right away if: You have trouble breathing. You have pain or pressure in your chest. You are confused. You have bluish lips and fingernails. You have trouble waking from sleep. You have symptoms that get worse. These symptoms may be an emergency. Get help right away. Call 911. Do not wait to see  if the symptoms will go away. Do not drive yourself to the hospital. Summary COVID-19 is an infection that is caused by a new coronavirus. Sometimes, there are no symptoms. Other times, symptoms range from mild to severe. Some people with a severe COVID-19 infection develop severe disease. The virus that causes COVID-19 can spread from person to person through droplets or aerosols from breathing, speaking, singing, coughing, or sneezing. Mild symptoms of COVID-19 can be managed at home with rest, fluids, and over-the-counter medicines. This information is not intended to replace advice given to you by your health care provider. Make sure you discuss any questions you have with your health care provider. Document Revised: 04/04/2021 Document Reviewed: 04/06/2021 Elsevier Patient Education  2023 Elsevier Inc.    If you have been instructed to have an in-person evaluation today at a local Urgent Care facility, please use the link below. It will take you to a list of all of our available [facility], including address, phone number and hours of operation. Please do not delay care.  [facility]  If you or a family member do not have a primary care provider, use the link below to schedule a visit and establish care. When you choose a [facility] primary care physician or advanced practice provider, you gain a long-term partner in health. Find a Primary Care Provider  Learn more about [facility] in-office and virtual care options:  - Get Care Now

## 2022-03-23 NOTE — Telephone Encounter (Signed)
  Chief Complaint: + COVID Symptoms: cough, congestion, SOB Frequency: symptoms started yesterday Pertinent Negatives: Patient denies   Disposition: [] ED /[] Urgent Care (no appt availability in office) / [] Appointment(In office/virtual)/ [x]  [facility]/ [] Home Care/ [] Refused Recommended Disposition /[] [facility]/ []  Follow-up with PCP Additional Notes: Patient advised per COVID protocol- treatment/isolation, virtual appointment scheduled.

## 2022-03-23 NOTE — Progress Notes (Signed)
Virtual Visit Consent   Diane Small, you are scheduled for a virtual visit with a [facility] provider today. Just as with appointments in the office, your consent must be obtained to participate. Your consent will be active for this visit and any virtual visit you may have with one of our providers in the next 365 days. If you have a MyChart account, a copy of this consent can be sent to you electronically.  As this is a virtual visit, video technology does not allow for your provider to perform a traditional examination. This may limit your provider's ability to fully assess your condition. If your provider identifies any concerns that need to be evaluated in person or the need to arrange testing (such as labs, EKG, etc.), we will make arrangements to do so. Although advances in technology are sophisticated, we cannot ensure that it will always work on either your end or our end. If the connection with a video visit is poor, the visit may have to be switched to a telephone visit. With either a video or telephone visit, we are not always able to ensure that we have a secure connection.  By engaging in this virtual visit, you consent to the provision of healthcare and authorize for your insurance to be billed (if applicable) for the services provided during this visit. Depending on your insurance coverage, you may receive a charge related to this service.  I need to obtain your verbal consent now. Are you willing to proceed with your visit today? [name] has provided verbal consent on 03/23/2022 for a virtual visit (video or telephone). Margaretann Loveless, PA-C  Date: 03/23/2022 11:27 AM  Virtual Visit via Video Note   I, Margaretann Loveless, connected with  Diane Small  ([id number], Sep 25, 1955) on 03/23/22 at 11:15 AM EST by a video-enabled telemedicine application and verified that I am speaking with the correct person using two identifiers.  Location: Patient: Virtual  Visit Location Patient: Home Provider: Virtual Visit Location Provider: Home Office   I discussed the limitations of evaluation and management by telemedicine and the availability of in person appointments. The patient expressed understanding and agreed to proceed.    History of Present Illness: Diane Small is a 66 y.o. who identifies as a female who was assigned female at birth, and is being seen today for Covid 34.  HPI: URI  This is a new problem. The current episode started yesterday (Symptoms started yesterday, and tested positive yesterday on at home test). Maximum temperature: subjective fever. Associated symptoms include congestion, coughing, headaches, rhinorrhea and sinus pain. Pertinent negatives include no ear pain. Associated symptoms comments: Nasal congestion. She has tried acetaminophen and increased fluids for the symptoms. The treatment provided no relief.   Has had covid vaccinations   Problems:  Patient Active Problem List   Diagnosis Date Noted   Surgery, elective 05/05/2021   S/P TKR (total knee replacement) 05/05/2021   S/P TKR (total knee replacement) using cement, right 05/04/2021   Rotator cuff tendinitis, right 10/05/2020   Pure hypercholesterolemia 04/16/2017   Hyperlipidemia 04/18/2016   DDD (degenerative disc disease), lumbosacral 10/17/2015   Esophageal reflux 04/18/2015   Essential hypertension 04/18/2015   Annual physical exam 04/18/2015   Routine general medical examination at a health care facility 09/01/2014   Glaucoma 09/01/2014   BMI 45.0-49.9, adult (HCC) 09/01/2014   Arthritis of knee, degenerative 09/09/2013    Allergies:  Allergies  Allergen Reactions   Amoxicillin-Pot Clavulanate  Diarrhea    TOLERATED CEFAZOLIN   Azithromycin Diarrhea   Medications:  Current Outpatient Medications:    benzonatate (TESSALON) 100 MG capsule, Take 1 capsule (100 mg total) by mouth 3 (three) times daily as needed., Disp: 30 capsule, Rfl: 0    fluticasone (FLONASE) 50 MCG/ACT nasal spray, Place 2 sprays into both nostrils daily., Disp: 16 g, Rfl: 0   nirmatrelvir/ritonavir EUA (PAXLOVID) 20 x 150 MG & 10 x 100MG  TABS, Take 3 tablets by mouth 2 (two) times daily for 5 days. (Take nirmatrelvir 150 mg two tablets twice daily for 5 days and ritonavir 100 mg one tablet twice daily for 5 days) Patient GFR is 73, Disp: 30 tablet, Rfl: 0   acetaminophen (TYLENOL) 650 MG CR tablet, Take 1,300 mg by mouth every 8 (eight) hours as needed for pain., Disp: , Rfl:    acetaminophen-codeine (TYLENOL #3) 300-30 MG tablet, Take 1-2 tablets by mouth every 6 (six) hours as needed for moderate pain., Disp: 30 tablet, Rfl: 0   albuterol (VENTOLIN HFA) 108 (90 Base) MCG/ACT inhaler, INHALE 1-2 PUFFS BY MOUTH EVERY 6 HOURS AS NEEDED FOR WHEEZE OR SHORTNESS OF BREATH, Disp: 8.5 each, Rfl: 2   ALPRAZolam (XANAX) 1 MG tablet, Take 2 mg by mouth at bedtime. psych, Disp: , Rfl:    aspirin EC 81 MG tablet, Take 81 mg by mouth daily. Swallow whole., Disp: , Rfl:    azelastine (ASTELIN) 0.1 % nasal spray, Place 1 spray into both nostrils 2 (two) times daily. Dr , Disp: , Rfl:    budesonide-formoterol (SYMBICORT) 160-4.5 MCG/ACT inhaler, Inhale 2 puffs into the lungs daily as needed (asthma). Dr Andee Poles, Disp: , Rfl:    Cranberry 4200 MG CAPS, Take by mouth., Disp: , Rfl:    dexlansoprazole (DEXILANT) 60 MG capsule, Take 1 capsule (60 mg total) by mouth daily., Disp: 90 capsule, Rfl: 1   escitalopram (LEXAPRO) 5 MG tablet, Take 5 mg by mouth daily. psych, Disp: , Rfl:    hydrochlorothiazide (HYDRODIURIL) 25 MG tablet, Take 1 tablet (25 mg total) by mouth daily., Disp: 90 tablet, Rfl: 1   latanoprost (XALATAN) 0.005 % ophthalmic solution, Place 1 drop into both eyes at bedtime. Dr Andee Poles, Disp: , Rfl:    montelukast (SINGULAIR) 10 MG tablet, TAKE 1 TABLET BY MOUTH EVERY DAY, Disp: 90 tablet, Rfl: 0   Omega-3 Fatty Acids (FISH OIL PO), Take 1 capsule by mouth  daily., Disp: , Rfl:    Semaglutide,0.25 or 0.5MG /DOS, 2 MG/1.5ML SOPN, Inject into the skin., Disp: , Rfl:    telmisartan (MICARDIS) 40 MG tablet, Take 1 tablet (40 mg total) by mouth daily., Disp: 90 tablet, Rfl: 1   timolol (TIMOPTIC) 0.5 % ophthalmic solution, Place 1 drop into both eyes daily. Dr Inez Pilgrim, Disp: , Rfl:    traMADol (ULTRAM) 50 MG tablet, Take 1 tablet (50 mg total) by mouth every 6 (six) hours as needed for moderate pain or severe pain. (Patient not taking: Reported on 10/24/2021), Disp: 40 tablet, Rfl: 1   Turmeric 500 MG CAPS, Take 500 mg by mouth daily., Disp: , Rfl:   Observations/Objective: Patient is well-developed, well-nourished in no acute distress.  Resting comfortably at home.  Head is normocephalic, atraumatic.  No labored breathing.  Speech is clear and coherent with logical content.  Patient is alert and oriented at baseline.    Assessment and Plan: 1. COVID-19 - nirmatrelvir/ritonavir EUA (PAXLOVID) 20 x 150 MG & 10 x 100MG  TABS; Take 3 tablets  by mouth 2 (two) times daily for 5 days. (Take nirmatrelvir 150 mg two tablets twice daily for 5 days and ritonavir 100 mg one tablet twice daily for 5 days) Patient GFR is 73  Dispense: 30 tablet; Refill: 0 - fluticasone (FLONASE) 50 MCG/ACT nasal spray; Place 2 sprays into both nostrils daily.  Dispense: 16 g; Refill: 0 - benzonatate (TESSALON) 100 MG capsule; Take 1 capsule (100 mg total) by mouth 3 (three) times daily as needed.  Dispense: 30 capsule; Refill: 0  - Continue OTC symptomatic management of choice - Will send OTC vitamins and supplement information through AVS - Paxlovid, flonase, tessalon prescribed - Patient enrolled in MyChart symptom monitoring - Push fluids - Rest as needed - Discussed return precautions and when to seek in-person evaluation, sent via AVS as well   Follow Up Instructions: I discussed the assessment and treatment plan with the patient. The patient was provided an  opportunity to ask questions and all were answered. The patient agreed with the plan and demonstrated an understanding of the instructions.  A copy of instructions were sent to the patient via MyChart unless otherwise noted below.    The patient was advised to call back or seek an in-person evaluation if the symptoms worsen or if the condition fails to improve as anticipated.  Time:  I spent 13 minutes with the patient via telehealth technology discussing the above problems/concerns.    Margaretann Loveless, PA-C

## 2022-03-23 NOTE — Telephone Encounter (Signed)
Reason for Disposition  [1] HIGH RISK patient (e.g., weak immune system, age > 64 years, obesity with BMI 30 or higher, pregnant, chronic lung disease or other chronic medical condition) AND [2] COVID symptoms (e.g., cough, fever)  (Exceptions: Already seen by PCP and no new or worsening symptoms.)  Answer Assessment - Initial Assessment Questions 1. COVID-19 DIAGNOSIS: "How do you know that you have COVID?" (e.g., positive lab test or self-test, diagnosed by doctor or NP/PA, symptoms after exposure).     + home test- yesterday evening 2. COVID-19 EXPOSURE: "Was there any known exposure to COVID before the symptoms began?" CDC Definition of close contact: within 6 feet (2 meters) for a total of 15 minutes or more over a 24-hour period.      unknown 3. ONSET: "When did the COVID-19 symptoms start?"      yesterday 4. WORST SYMPTOM: "What is your worst symptom?" (e.g., cough, fever, shortness of breath, muscle aches)     Congestion- teeth pain 5. COUGH: "Do you have a cough?" If Yes, ask: "How bad is the cough?"       yes 6. FEVER: "Do you have a fever?" If Yes, ask: "What is your temperature, how was it measured, and when did it start?"     no 7. RESPIRATORY STATUS: "Describe your breathing?" (e.g., normal; shortness of breath, wheezing, unable to speak)      Last night bad- patient states she could not breath last night 8. BETTER-SAME-WORSE: "Are you getting better, staying the same or getting worse compared to yesterday?"  If getting worse, ask, "In what way?"     same 9. OTHER SYMPTOMS: "Do you have any other symptoms?"  (e.g., chills, fatigue, headache, loss of smell or taste, muscle pain, sore throat)     chills 10. HIGH RISK DISEASE: "Do you have any chronic medical problems?" (e.g., asthma, heart or lung disease, weak immune system, obesity, etc.)       asthma 11. VACCINE: "Have you had the COVID-19 vaccine?" If Yes, ask: "Which one, how many shots, when did you get it?"       Yes 12.  PREGNANCY: "Is there any chance you are pregnant?" "When was your last menstrual period?"       na  Protocols used: Coronavirus (COVID-19) Diagnosed or Suspected-A-AH

## 2022-04-16 DIAGNOSIS — Z713 Dietary counseling and surveillance: Secondary | ICD-10-CM | POA: Diagnosis not present

## 2022-04-25 ENCOUNTER — Other Ambulatory Visit: Payer: Self-pay | Admitting: Family Medicine

## 2022-04-25 ENCOUNTER — Ambulatory Visit: Payer: Medicare Other | Admitting: Family Medicine

## 2022-04-25 DIAGNOSIS — I10 Essential (primary) hypertension: Secondary | ICD-10-CM

## 2022-04-26 ENCOUNTER — Ambulatory Visit (INDEPENDENT_AMBULATORY_CARE_PROVIDER_SITE_OTHER): Payer: Medicare Other | Admitting: Family Medicine

## 2022-04-26 ENCOUNTER — Telehealth: Payer: Self-pay

## 2022-04-26 ENCOUNTER — Telehealth: Payer: Self-pay | Admitting: Family Medicine

## 2022-04-26 ENCOUNTER — Encounter: Payer: Self-pay | Admitting: Family Medicine

## 2022-04-26 VITALS — BP 110/78 | HR 60 | Ht 63.0 in | Wt 230.0 lb

## 2022-04-26 DIAGNOSIS — E7800 Pure hypercholesterolemia, unspecified: Secondary | ICD-10-CM | POA: Diagnosis not present

## 2022-04-26 DIAGNOSIS — Z0000 Encounter for general adult medical examination without abnormal findings: Secondary | ICD-10-CM | POA: Diagnosis not present

## 2022-04-26 LAB — HEMOCCULT GUIAC POC 1CARD (OFFICE): Fecal Occult Blood, POC: NEGATIVE

## 2022-04-26 NOTE — Telephone Encounter (Signed)
Copied from CRM [id number]. Topic: General - Other >> Apr 26, 2022  4:15 PM Everette C wrote: Reason for CRM: The patient would like to be contacted by a member of clinical staff to review their glucose levels as well  The patient did not seen them on their lab results

## 2022-04-26 NOTE — Telephone Encounter (Signed)
Pt called to give her new psychiatrist information. Haddison Henley at [facility] in [place]. Telephone- [phone]

## 2022-04-26 NOTE — Progress Notes (Signed)
Date:  04/26/2022   Name:  Diane Small   DOB:  1955/09/08   MRN:  [id number]   Chief Complaint: Annual Exam (With breast exam)  Patient is a 66 year old female who presents for a comprehensive physical exam. The patient reports the following problems: long covid. Health maintenance has been reviewed up to date.      Lab Results  Component Value Date   NA 142 06/12/2021   K 4.4 06/12/2021   CO2 25 06/12/2021   GLUCOSE 107 (H) 06/12/2021   BUN 9 06/12/2021   CREATININE 0.88 06/12/2021   CALCIUM 9.7 06/12/2021   EGFR 73 06/12/2021   GFRNONAA >60 05/08/2021   Lab Results  Component Value Date   CHOL 177 10/24/2021   HDL 45 10/24/2021   LDLCALC 109 (H) 10/24/2021   TRIG 128 10/24/2021   CHOLHDL 3.2 10/15/2017   Lab Results  Component Value Date   TSH 1.210 10/15/2017   Lab Results  Component Value Date   HGBA1C 5.5 06/12/2021   Lab Results  Component Value Date   WBC 9.9 06/12/2021   HGB 12.6 06/12/2021   HCT 39.8 06/12/2021   MCV 86 06/12/2021   PLT 449 06/12/2021   Lab Results  Component Value Date   ALT 14 04/18/2021   AST 22 04/18/2021   ALKPHOS 69 04/18/2021   BILITOT 0.6 04/18/2021   No results found for: "25OHVITD2", "25OHVITD3", "VD25OH"   Review of Systems  Constitutional: Negative.  Negative for chills, fatigue, fever and unexpected weight change.  HENT:  Negative for congestion, ear discharge, ear pain, mouth sores, postnasal drip, rhinorrhea, sinus pressure, sneezing and sore throat.   Respiratory:  Negative for cough, shortness of breath, wheezing and stridor.   Cardiovascular:  Negative for chest pain, palpitations and leg swelling.  Gastrointestinal:  Negative for abdominal pain, blood in stool, constipation, diarrhea and nausea.  Genitourinary:  Negative for dysuria, flank pain, frequency, hematuria, urgency and vaginal discharge.  Musculoskeletal:  Negative for arthralgias, back pain and myalgias.  Skin:  Negative for rash.   Neurological:  Negative for dizziness, weakness and headaches.  Hematological:  Negative for adenopathy. Does not bruise/bleed easily.  Psychiatric/Behavioral:  Negative for dysphoric mood. The patient is not nervous/anxious.     Patient Active Problem List   Diagnosis Date Noted   Surgery, elective 05/05/2021   S/P TKR (total knee replacement) 05/05/2021   S/P TKR (total knee replacement) using cement, right 05/04/2021   Rotator cuff tendinitis, right 10/05/2020   Pure hypercholesterolemia 04/16/2017   Hyperlipidemia 04/18/2016   DDD (degenerative disc disease), lumbosacral 10/17/2015   Esophageal reflux 04/18/2015   Essential hypertension 04/18/2015   Annual physical exam 04/18/2015   Routine general medical examination at a health care facility 09/01/2014   Glaucoma 09/01/2014   BMI 45.0-49.9, adult ([place]) 09/01/2014   Arthritis of knee, degenerative 09/09/2013    Allergies  Allergen Reactions   Amoxicillin-Pot Clavulanate Diarrhea    TOLERATED CEFAZOLIN   Azithromycin Diarrhea    Past Surgical History:  Procedure Laterality Date   CERVICAL SPINE SURGERY  03/01/1981   C4 fracture from [place]   COLONOSCOPY WITH ESOPHAGOGASTRODUODENOSCOPY (EGD)  2011   COLONOSCOPY WITH PROPOFOL N/A 07/13/2019   Procedure: COLONOSCOPY WITH PROPOFOL;  Surgeon: Lucilla Lame, MD;  Location: [facility];  Service: Endoscopy;  Laterality: N/A;   ESOPHAGOGASTRODUODENOSCOPY (EGD) WITH PROPOFOL N/A 07/13/2019   Procedure: ESOPHAGOGASTRODUODENOSCOPY (EGD)  WITH PROPOFOL;  Surgeon: Lucilla Lame, MD;  Location: [facility];  Service: Endoscopy;  Laterality: N/A;  PRIORITY 3   KNEE ARTHROSCOPY Left 07/20/2021   Procedure: Left knee arthroscopy and attempted loose foreign body removal;  Surgeon: Hessie Knows, MD;  Location: [facility];  Service: Orthopedics;  Laterality: Left;   TOTAL ABDOMINAL HYSTERECTOMY  1992   TOTAL KNEE ARTHROPLASTY Left 07/27/2014    TOTAL KNEE ARTHROPLASTY Right 05/04/2021   Procedure: TOTAL KNEE ARTHROPLASTY;  Surgeon: Hessie Knows, MD;  Location: [facility];  Service: Orthopedics;  Laterality: Right;    Social History   Tobacco Use   Smoking status: Never   Smokeless tobacco: Never  Vaping Use   Vaping Use: Never used  Substance Use Topics   Alcohol use: Yes    Comment: rarely   Drug use: No     Medication list has been reviewed and updated.  Current Meds  Medication Sig   acetaminophen (TYLENOL) 650 MG CR tablet Take 1,300 mg by mouth every 8 (eight) hours as needed for pain.   albuterol (VENTOLIN HFA) 108 (90 Base) MCG/ACT inhaler INHALE 1-2 PUFFS BY MOUTH EVERY 6 HOURS AS NEEDED FOR WHEEZE OR SHORTNESS OF BREATH   ALPRAZolam (XANAX) 1 MG tablet Take 2 mg by mouth at bedtime. psych   aspirin EC 81 MG tablet Take 81 mg by mouth daily. Swallow whole.   azelastine (ASTELIN) 0.1 % nasal spray Place 1 spray into both nostrils 2 (two) times daily. Dr Pryor Ochoa   Cranberry 4200 MG CAPS Take by mouth.   dexlansoprazole (DEXILANT) 60 MG capsule Take 1 capsule (60 mg total) by mouth daily.   escitalopram (LEXAPRO) 5 MG tablet Take 5 mg by mouth daily. psych   fluticasone (FLONASE) 50 MCG/ACT nasal spray Place 2 sprays into both nostrils daily.   hydrochlorothiazide (HYDRODIURIL) 25 MG tablet TAKE 1 TABLET (25 MG TOTAL) BY MOUTH DAILY.   latanoprost (XALATAN) 0.005 % ophthalmic solution Place 1 drop into both eyes at bedtime. Dr Wallace Going   montelukast (SINGULAIR) 10 MG tablet TAKE 1 TABLET BY MOUTH EVERY DAY   Omega-3 Fatty Acids (FISH OIL PO) Take 1 capsule by mouth daily.   Semaglutide,0.25 or 0.5MG/DOS, 2 MG/1.5ML SOPN Inject into the skin.   telmisartan (MICARDIS) 40 MG tablet TAKE 1 TABLET BY MOUTH EVERY DAY   timolol (TIMOPTIC) 0.5 % ophthalmic solution Place 1 drop into both eyes daily. Dr Wallace Going   Turmeric 500 MG CAPS Take 500 mg by mouth daily.   [DISCONTINUED] acetaminophen-codeine (TYLENOL #3) 300-30  MG tablet Take 1-2 tablets by mouth every 6 (six) hours as needed for moderate pain.   [DISCONTINUED] budesonide-formoterol (SYMBICORT) 160-4.5 MCG/ACT inhaler Inhale 2 puffs into the lungs daily as needed (asthma). Dr Pryor Ochoa       04/26/2022    9:36 AM 10/24/2021    9:25 AM 04/25/2021    9:45 AM 10/05/2020    9:46 AM  GAD 7 : Generalized Anxiety Score  Nervous, Anxious, on Edge 0 0 0 1  Control/stop worrying 0 0 0 1  Worry too much - different things 0 0 0 1  Trouble relaxing 0 0 0 1  Restless 0 0 0 0  Easily annoyed or irritable 0 0 0 0  Afraid - awful might happen 0 0 0 1  Total GAD 7 Score 0 0 0 5  Anxiety Difficulty Not difficult at all Not difficult at all Not difficult at all Somewhat difficult  04/26/2022    9:36 AM 10/24/2021    9:24 AM 04/25/2021    9:45 AM  Depression screen PHQ 2/9  Decreased Interest 0 0 0  Down, Depressed, Hopeless 0 0 0  PHQ - 2 Score 0 0 0  Altered sleeping 0 0 0  Tired, decreased energy 0 0 0  Change in appetite 0 0 0  Feeling bad or failure about yourself  0 0 0  Trouble concentrating 0 0 0  Moving slowly or fidgety/restless 0 0 0  Suicidal thoughts 0 0 0  PHQ-9 Score 0 0 0  Difficult doing work/chores Not difficult at all Not difficult at all Not difficult at all    BP Readings from Last 3 Encounters:  04/26/22 110/78  10/24/21 122/80  07/20/21 (!) 154/67    Physical Exam Vitals and nursing note reviewed. Exam conducted with a chaperone present.  Constitutional:      General: She is not in acute distress.    Appearance: She is well-developed and well-groomed. She is obese. She is not diaphoretic.  HENT:     Head: Normocephalic and atraumatic.     Right Ear: Hearing, tympanic membrane, ear canal and external ear normal. There is no impacted cerumen.     Left Ear: Hearing, tympanic membrane, ear canal and external ear normal. There is no impacted cerumen.     Nose: Nose normal. No congestion or rhinorrhea.     Mouth/Throat:      Lips: Pink.     Mouth: Mucous membranes are moist.     Dentition: Normal dentition. No dental caries.     Tongue: No lesions.     Palate: No mass.     Pharynx: Oropharynx is clear. Uvula midline. No pharyngeal swelling, oropharyngeal exudate or posterior oropharyngeal erythema.     Tonsils: No tonsillar exudate.  Eyes:     General: Lids are normal. Vision grossly intact. Gaze aligned appropriately.        Right eye: No discharge.        Left eye: No discharge.     Extraocular Movements: Extraocular movements intact.     Conjunctiva/sclera: Conjunctivae normal.     Pupils: Pupils are equal, round, and reactive to light.  Neck:     Thyroid: No thyroid mass, thyromegaly or thyroid tenderness.     Vascular: Normal carotid pulses. No carotid bruit, hepatojugular reflux or JVD.     Trachea: Trachea and phonation normal.  Cardiovascular:     Rate and Rhythm: Normal rate and regular rhythm.     Chest Wall: PMI is not displaced.     Pulses: Normal pulses.          Carotid pulses are 2+ on the right side and 2+ on the left side.      Radial pulses are 2+ on the right side and 2+ on the left side.       Femoral pulses are 2+ on the right side and 2+ on the left side.      Popliteal pulses are 2+ on the right side and 2+ on the left side.       Dorsalis pedis pulses are 2+ on the right side and 2+ on the left side.       Posterior tibial pulses are 2+ on the right side and 2+ on the left side.     Heart sounds: Normal heart sounds, S1 normal and S2 normal. No murmur heard.    No systolic murmur is present.  No diastolic murmur is present.     No friction rub. No gallop. No S3 or S4 sounds.  Pulmonary:     Effort: Pulmonary effort is normal.     Breath sounds: Normal breath sounds. No decreased breath sounds, wheezing, rhonchi or rales.  Chest:     Chest wall: No tenderness.  Breasts:    Right: Normal. No swelling, bleeding, inverted nipple, mass, nipple discharge, skin change or  tenderness.     Left: Normal. No swelling, bleeding, inverted nipple, mass, nipple discharge, skin change or tenderness.  Abdominal:     General: Bowel sounds are normal.     Palpations: Abdomen is soft. There is no hepatomegaly, splenomegaly or mass.     Tenderness: There is no abdominal tenderness. There is no guarding.     Hernia: There is no hernia in the umbilical area or ventral area.  Genitourinary:    Rectum: Normal. Guaiac result negative. No mass.  Musculoskeletal:        General: Normal range of motion.     Cervical back: Full passive range of motion without pain, normal range of motion and neck supple.     Right lower leg: 1+ Pitting Edema present.     Left lower leg: 1+ Pitting Edema present.  Lymphadenopathy:     Head:     Right side of head: No submental or submandibular adenopathy.     Left side of head: No submental or submandibular adenopathy.     Cervical: No cervical adenopathy.     Right cervical: No superficial, deep or posterior cervical adenopathy.    Left cervical: No superficial, deep or posterior cervical adenopathy.     Upper Body:     Right upper body: No supraclavicular or axillary adenopathy.     Left upper body: No supraclavicular or axillary adenopathy.  Skin:    General: Skin is warm and dry.  Neurological:     Mental Status: She is alert.     Cranial Nerves: Cranial nerves 2-12 are intact.     Sensory: Sensation is intact.     Motor: Motor function is intact.     Deep Tendon Reflexes: Reflexes are normal and symmetric.  Psychiatric:        Behavior: Behavior is cooperative.     Wt Readings from Last 3 Encounters:  04/26/22 230 lb (104.3 kg)  10/24/21 262 lb (118.8 kg)  07/20/21 269 lb (122 kg)    BP 110/78   Pulse 60   Ht _0  (1.6 m)   Wt 230 lb (104.3 kg)   SpO2 96%   BMI 40.74 kg/m   Assessment and Plan:  1. Annual physical exam No subjective/objective concerns noted during history of present illness, review of past medical  history and medications, review of systems, and physical exam.  Will check CMP and lipid for current status guaiac was noted to be negative. - Comprehensive Metabolic Panel (CMET) - Lipid Panel With LDL/HDL Ratio - POCT Occult Blood Stool    Otilio Miu, MD

## 2022-04-26 NOTE — Telephone Encounter (Signed)
Copied from CRM [id number]. Topic: General - Other >> Apr 26, 2022  4:08 PM Everette C wrote: Reason for CRM: [place] has assigned the patient a new [employer] fast streamer with [employer] out of  [place], [place]   The patient's new psychiatrist can be reached at [phone]  The patient would like to be contacted further if needed

## 2022-04-27 ENCOUNTER — Other Ambulatory Visit: Payer: Self-pay

## 2022-04-27 DIAGNOSIS — E876 Hypokalemia: Secondary | ICD-10-CM

## 2022-04-27 DIAGNOSIS — I10 Essential (primary) hypertension: Secondary | ICD-10-CM

## 2022-04-27 LAB — COMPREHENSIVE METABOLIC PANEL
ALT: 19 IU/L (ref 0–32)
AST: 23 IU/L (ref 0–40)
Albumin/Globulin Ratio: 1.4 (ref 1.2–2.2)
Albumin: 4.2 g/dL (ref 3.9–4.9)
Alkaline Phosphatase: 82 IU/L (ref 44–121)
BUN/Creatinine Ratio: 16 (ref 12–28)
BUN: 15 mg/dL (ref 8–27)
Bilirubin Total: 0.3 mg/dL (ref 0.0–1.2)
CO2: 25 mmol/L (ref 20–29)
Calcium: 9.4 mg/dL (ref 8.7–10.3)
Chloride: 97 mmol/L (ref 96–106)
Creatinine, Ser: 0.93 mg/dL (ref 0.57–1.00)
Globulin, Total: 2.9 g/dL (ref 1.5–4.5)
Glucose: 97 mg/dL (ref 70–99)
Potassium: 3.4 mmol/L — ABNORMAL LOW (ref 3.5–5.2)
Sodium: 138 mmol/L (ref 134–144)
Total Protein: 7.1 g/dL (ref 6.0–8.5)
eGFR: 68 mL/min/{1.73_m2} (ref >59)

## 2022-04-27 LAB — LIPID PANEL WITH LDL/HDL RATIO
Cholesterol, Total: 148 mg/dL (ref 100–199)
HDL: 47 mg/dL (ref >39)
LDL Chol Calc (NIH): 81 mg/dL (ref 0–99)
LDL/HDL Ratio: 1.7 ratio (ref 0.0–3.2)
Triglycerides: 112 mg/dL (ref 0–149)
VLDL Cholesterol Cal: 20 mg/dL (ref 5–40)

## 2022-04-27 MED ORDER — POTASSIUM CHLORIDE CRYS ER 10 MEQ PO TBCR: 10.0000 meq | EXTENDED_RELEASE_TABLET | Freq: Every day | ORAL | 0 refills | Status: DC

## 2022-05-26 ENCOUNTER — Other Ambulatory Visit: Payer: Self-pay | Admitting: Family Medicine

## 2022-05-26 DIAGNOSIS — K219 Gastro-esophageal reflux disease without esophagitis: Secondary | ICD-10-CM

## 2022-05-28 NOTE — Telephone Encounter (Signed)
Requested Prescriptions  Pending Prescriptions Disp Refills   dexlansoprazole (DEXILANT) 60 MG capsule [Pharmacy Med Name: DEXLANSOPRAZOLE DR 60 MG CAP] 90 capsule 1    Sig: TAKE 1 CAPSULE BY MOUTH EVERY DAY     Gastroenterology: Proton Pump Inhibitors Passed - 05/26/2022  1:01 AM      Passed - Valid encounter within last 12 months    Recent Outpatient Visits           1 month ago Annual physical exam   [facility] [place], Deanna C, MD   7 months ago Essential hypertension   [facility] [place], Deanna C, MD   11 months ago Essential hypertension   [facility] [place], Deanna C, MD   1 year ago Essential hypertension   [facility] [place], Deanna C, MD   1 year ago Rotator cuff tendinitis, right   [place] [facility] [facility], Earley Abide, MD       Future Appointments             In 4 weeks Juline Patch, MD [place] [facility], [facility]

## 2022-06-07 ENCOUNTER — Other Ambulatory Visit: Payer: Self-pay | Admitting: Family Medicine

## 2022-06-07 DIAGNOSIS — E876 Hypokalemia: Secondary | ICD-10-CM

## 2022-06-07 DIAGNOSIS — J4520 Mild intermittent asthma, uncomplicated: Secondary | ICD-10-CM

## 2022-06-15 ENCOUNTER — Other Ambulatory Visit: Payer: Medicare Other

## 2022-06-26 ENCOUNTER — Ambulatory Visit: Payer: Medicare Other | Admitting: Family Medicine

## 2022-06-26 ENCOUNTER — Telehealth: Payer: Self-pay | Admitting: Family Medicine

## 2022-06-26 ENCOUNTER — Encounter: Payer: Self-pay | Admitting: Family Medicine

## 2022-06-26 ENCOUNTER — Other Ambulatory Visit: Payer: Self-pay | Admitting: Family Medicine

## 2022-06-26 ENCOUNTER — Other Ambulatory Visit: Payer: Self-pay

## 2022-06-26 ENCOUNTER — Ambulatory Visit (INDEPENDENT_AMBULATORY_CARE_PROVIDER_SITE_OTHER): Payer: Medicare Other | Admitting: Family Medicine

## 2022-06-26 VITALS — BP 122/62 | HR 68 | Ht 63.0 in | Wt 225.0 lb

## 2022-06-26 DIAGNOSIS — K219 Gastro-esophageal reflux disease without esophagitis: Secondary | ICD-10-CM | POA: Diagnosis not present

## 2022-06-26 DIAGNOSIS — I10 Essential (primary) hypertension: Secondary | ICD-10-CM | POA: Diagnosis not present

## 2022-06-26 DIAGNOSIS — E876 Hypokalemia: Secondary | ICD-10-CM

## 2022-06-26 DIAGNOSIS — J4520 Mild intermittent asthma, uncomplicated: Secondary | ICD-10-CM

## 2022-06-26 MED ORDER — ALBUTEROL SULFATE HFA 108 (90 BASE) MCG/ACT IN AERS: INHALATION_SPRAY | RESPIRATORY_TRACT | 2 refills | Status: DC

## 2022-06-26 MED ORDER — DEXLANSOPRAZOLE 60 MG PO CPDR: 60.0000 mg | DELAYED_RELEASE_CAPSULE | Freq: Every day | ORAL | 1 refills | Status: DC

## 2022-06-26 MED ORDER — POTASSIUM CHLORIDE CRYS ER 10 MEQ PO TBCR: 10.0000 meq | EXTENDED_RELEASE_TABLET | Freq: Every day | ORAL | 0 refills | Status: DC

## 2022-06-26 MED ORDER — TELMISARTAN 40 MG PO TABS: 40.0000 mg | ORAL_TABLET | Freq: Every day | ORAL | 1 refills | Status: DC

## 2022-06-26 NOTE — Progress Notes (Signed)
Date:  06/26/2022   Name:  Diane Small   DOB:  22-Nov-1955   MRN:  [id number]   Chief Complaint: Hypertension, hypokalemia, and Asthma  Hypertension This is a chronic problem. The current episode started more than 1 year ago. The problem has been gradually improving since onset. The problem is controlled. Pertinent negatives include no anxiety, blurred vision, chest pain, headaches, palpitations, PND or shortness of breath. Past treatments include angiotensin blockers. The current treatment provides moderate improvement. There are no compliance problems.  There is no history of CAD/MI or CVA. There is no history of chronic renal disease, a hypertension causing med or renovascular disease.  Asthma There is no cough, difficulty breathing, frequent throat clearing, hemoptysis, shortness of breath, sputum production or wheezing. This is a chronic problem. The current episode started more than 1 year ago. The problem has been gradually improving. Pertinent negatives include no chest pain, ear pain, fever, headaches, myalgias, PND, rhinorrhea, sore throat or trouble swallowing. Her symptoms are aggravated by change in weather and pollen. Her past medical history is significant for asthma.    Lab Results  Component Value Date   NA 138 04/26/2022   K 3.4 (L) 04/26/2022   CO2 25 04/26/2022   GLUCOSE 97 04/26/2022   BUN 15 04/26/2022   CREATININE 0.93 04/26/2022   CALCIUM 9.4 04/26/2022   EGFR 68 04/26/2022   GFRNONAA >60 05/08/2021   Lab Results  Component Value Date   CHOL 148 04/26/2022   HDL 47 04/26/2022   LDLCALC 81 04/26/2022   TRIG 112 04/26/2022   CHOLHDL 3.2 10/15/2017   Lab Results  Component Value Date   TSH 1.210 10/15/2017   Lab Results  Component Value Date   HGBA1C 5.5 06/12/2021   Lab Results  Component Value Date   WBC 9.9 06/12/2021   HGB 12.6 06/12/2021   HCT 39.8 06/12/2021   MCV 86 06/12/2021   PLT 449 06/12/2021   Lab Results  Component Value  Date   ALT 19 04/26/2022   AST 23 04/26/2022   ALKPHOS 82 04/26/2022   BILITOT 0.3 04/26/2022   No results found for: "25OHVITD2", "25OHVITD3", "VD25OH"   Review of Systems  Constitutional: Negative.  Negative for chills, fatigue, fever and unexpected weight change.  HENT:  Negative for congestion, ear discharge, ear pain, rhinorrhea, sore throat and trouble swallowing.   Eyes:  Negative for blurred vision.  Respiratory:  Negative for cough, hemoptysis, sputum production, chest tightness, shortness of breath, wheezing and stridor.   Cardiovascular:  Negative for chest pain, palpitations and PND.  Gastrointestinal:  Negative for abdominal pain, blood in stool and nausea.  Genitourinary:  Negative for dysuria, frequency and urgency.  Musculoskeletal:  Negative for myalgias.  Skin:  Negative for rash.  Neurological:  Negative for dizziness, weakness and headaches.  Hematological:  Negative for adenopathy. Does not bruise/bleed easily.  Psychiatric/Behavioral:  Negative for dysphoric mood. The patient is not nervous/anxious.     Patient Active Problem List   Diagnosis Date Noted   Surgery, elective 05/05/2021   S/P TKR (total knee replacement) 05/05/2021   S/P TKR (total knee replacement) using cement, right 05/04/2021   Rotator cuff tendinitis, right 10/05/2020   Pure hypercholesterolemia 04/16/2017   Hyperlipidemia 04/18/2016   DDD (degenerative disc disease), lumbosacral 10/17/2015   Esophageal reflux 04/18/2015   Essential hypertension 04/18/2015   Annual physical exam 04/18/2015   Routine general medical examination at a health care facility 09/01/2014   Glaucoma  09/01/2014   BMI 45.0-49.9, adult ([facility]) 09/01/2014   Arthritis of knee, degenerative 09/09/2013    Allergies  Allergen Reactions   Amoxicillin-Pot Clavulanate Diarrhea    TOLERATED CEFAZOLIN   Azithromycin Diarrhea    Past Surgical History:  Procedure Laterality Date   CERVICAL SPINE SURGERY  03/01/1981    C4 fracture from [place]   COLONOSCOPY WITH ESOPHAGOGASTRODUODENOSCOPY (EGD)  2011   COLONOSCOPY WITH PROPOFOL N/A 07/13/2019   Procedure: COLONOSCOPY WITH PROPOFOL;  Surgeon: Lucilla Lame, MD;  Location: [facility];  Service: Endoscopy;  Laterality: N/A;   ESOPHAGOGASTRODUODENOSCOPY (EGD) WITH PROPOFOL N/A 07/13/2019   Procedure: ESOPHAGOGASTRODUODENOSCOPY (EGD) WITH PROPOFOL;  Surgeon: Lucilla Lame, MD;  Location: [facility];  Service: Endoscopy;  Laterality: N/A;  PRIORITY 3   KNEE ARTHROSCOPY Left 07/20/2021   Procedure: Left knee arthroscopy and attempted loose foreign body removal;  Surgeon: Hessie Knows, MD;  Location: [facility];  Service: Orthopedics;  Laterality: Left;   TOTAL ABDOMINAL HYSTERECTOMY  1992   TOTAL KNEE ARTHROPLASTY Left 07/27/2014   TOTAL KNEE ARTHROPLASTY Right 05/04/2021   Procedure: TOTAL KNEE ARTHROPLASTY;  Surgeon: Hessie Knows, MD;  Location: [facility];  Service: Orthopedics;  Laterality: Right;    Social History   Tobacco Use   Smoking status: Never   Smokeless tobacco: Never  Vaping Use   Vaping Use: Never used  Substance Use Topics   Alcohol use: Yes    Comment: rarely   Drug use: No     Medication list has been reviewed and updated.  Current Meds  Medication Sig   acetaminophen (TYLENOL) 650 MG CR tablet Take 1,300 mg by mouth every 8 (eight) hours as needed for pain.   albuterol (VENTOLIN HFA) 108 (90 Base) MCG/ACT inhaler INHALE 1-2 PUFFS BY MOUTH EVERY 6 HOURS AS NEEDED FOR WHEEZE OR SHORTNESS OF BREATH   ALPRAZolam (XANAX) 1 MG tablet Take 2 mg by mouth at bedtime. psych   aspirin EC 81 MG tablet Take 81 mg by mouth daily. Swallow whole.   azelastine (ASTELIN) 0.1 % nasal spray Place 1 spray into both nostrils 2 (two) times daily. Dr Pryor Ochoa   Cranberry 4200 MG CAPS Take by mouth.   dexlansoprazole (DEXILANT) 60 MG capsule TAKE 1 CAPSULE BY MOUTH EVERY DAY   escitalopram (LEXAPRO) 5 MG tablet  Take 5 mg by mouth daily. psych   KLOR-CON M10 10 MEQ tablet TAKE 1 TABLET BY MOUTH EVERY DAY   latanoprost (XALATAN) 0.005 % ophthalmic solution Place 1 drop into both eyes at bedtime. Dr Wallace Going   montelukast (SINGULAIR) 10 MG tablet TAKE 1 TABLET BY MOUTH EVERY DAY   Multiple Vitamin (MULTIVITAMIN) capsule Take 1 capsule by mouth daily.   Omega-3 Fatty Acids (FISH OIL PO) Take 1 capsule by mouth daily.   telmisartan (MICARDIS) 40 MG tablet TAKE 1 TABLET BY MOUTH EVERY DAY   timolol (TIMOPTIC) 0.5 % ophthalmic solution Place 1 drop into both eyes daily. Dr Wallace Going   topiramate (TOPAMAX) 25 MG tablet Take 25 mg by mouth daily.   Turmeric 500 MG CAPS Take 500 mg by mouth daily.   [DISCONTINUED] fluticasone (FLONASE) 50 MCG/ACT nasal spray Place 2 sprays into both nostrils daily.       06/26/2022    1:52 PM 04/26/2022    9:36 AM 10/24/2021    9:25 AM 04/25/2021    9:45 AM  GAD 7 : Generalized Anxiety Score  Nervous, Anxious, on  Edge 0 0 0 0  Control/stop worrying 0 0 0 0  Worry too much - different things 0 0 0 0  Trouble relaxing 0 0 0 0  Restless 0 0 0 0  Easily annoyed or irritable 0 0 0 0  Afraid - awful might happen 0 0 0 0  Total GAD 7 Score 0 0 0 0  Anxiety Difficulty Not difficult at all Not difficult at all Not difficult at all Not difficult at all       06/26/2022    1:52 PM 04/26/2022    9:36 AM 10/24/2021    9:24 AM  Depression screen PHQ 2/9  Decreased Interest 0 0 0  Down, Depressed, Hopeless 0 0 0  PHQ - 2 Score 0 0 0  Altered sleeping 0 0 0  Tired, decreased energy 0 0 0  Change in appetite 0 0 0  Feeling bad or failure about yourself  0 0 0  Trouble concentrating 0 0 0  Moving slowly or fidgety/restless 0 0 0  Suicidal thoughts 0 0 0  PHQ-9 Score 0 0 0  Difficult doing work/chores Not difficult at all Not difficult at all Not difficult at all    BP Readings from Last 3 Encounters:  06/26/22 122/62  04/26/22 110/78  10/24/21 122/80    Physical  Exam HENT:     Head: Normocephalic.     Right Ear: Tympanic membrane and ear canal normal.     Left Ear: Tympanic membrane and ear canal normal.     Nose: Nose normal. No congestion or rhinorrhea.     Mouth/Throat:     Mouth: Mucous membranes are moist.  Eyes:     General:        Right eye: No discharge.        Left eye: No discharge.     Extraocular Movements: Extraocular movements intact.     Pupils: Pupils are equal, round, and reactive to light.  Cardiovascular:     Heart sounds: No murmur heard.    No friction rub. No gallop.  Pulmonary:     Breath sounds: No wheezing, rhonchi or rales.  Abdominal:     Tenderness: There is no guarding or rebound.  Musculoskeletal:     Cervical back: Normal range of motion.  Neurological:     Mental Status: She is alert.     Wt Readings from Last 3 Encounters:  06/26/22 225 lb (102.1 kg)  04/26/22 230 lb (104.3 kg)  10/24/21 262 lb (118.8 kg)    BP 122/62   Pulse 68   Ht '5\' 3"'$  (1.6 m)   Wt 225 lb (102.1 kg)   SpO2 97%   BMI 39.86 kg/m   Assessment and Plan: 1. Essential hypertension Chronic.  Controlled.  Stable.  Blood pressure pressure 122/62.  Asymptomatic.  Tolerating medication well.  We recently discontinued hydrochlorothiazide due to hypokalemia patient returns only on telmisartan 40 mg once a day and blood pressure is doing excellent at this time at 122/62.  Will check renal function panel for current electrolytes and GFR. - telmisartan (MICARDIS) 40 MG tablet; Take 1 tablet (40 mg total) by mouth daily.  Dispense: 90 tablet; Refill: 1 - Renal Function Panel  2. Mild intermittent asthma, unspecified whether complicated Chronic.  Controlled.  Stable.  Continue albuterol 1 to 2 puffs every 6 hours as needed wheezing.  We discussed that the pollen counts are high for the trees including a home cedar and [place].  I  have suggested that it may be best if she does continue wearing a mask when she is out in the open air. -  albuterol (VENTOLIN HFA) 108 (90 Base) MCG/ACT inhaler; INHALE 1-2 PUFFS BY MOUTH EVERY 6 HOURS AS NEEDED FOR WHEEZE OR SHORTNESS OF BREATH  Dispense: 8.5 each; Refill: 2  3. Gastroesophageal reflux disease, unspecified whether esophagitis present Chronic.  Controlled.  Stable.  Patient is currently taking Dexilant and has plenty of medication at this time which is not necessary to refill at this time but will do so and in future date.  4. Hypokalemia New onset it was noted that with potassium was decreased at last visit but we discontinued the hydrochlorothiazide and we will recheck electrolytes at this time and if serum potassium has returned to normal range we may discontinue the potassium chloride. - potassium chloride (KLOR-CON M10) 10 MEQ tablet; Take 1 tablet (10 mEq total) by mouth daily.  Dispense: 90 tablet; Refill: 0     Otilio Miu, MD

## 2022-06-26 NOTE — Telephone Encounter (Signed)
Noted  Medication removed from list.  KP

## 2022-06-26 NOTE — Telephone Encounter (Signed)
Copied from [place] [id number]. Topic: General - Inquiry >> Jun 26, 2022 11:01 AM Marcellus Scott wrote: Reason for CRM: Pt stated that HIV testing doesn't apply to her because she is not having sex. Pt asked that Semaglutide,0.25 or 0.'5MG'$ /DOS, 2 MG/1.5ML SOPN be removed from her medication list because she is not taking this medication.  Please review.

## 2022-06-27 LAB — RENAL FUNCTION PANEL
Albumin: 4.2 g/dL (ref 3.9–4.9)
BUN/Creatinine Ratio: 16 (ref 12–28)
BUN: 15 mg/dL (ref 8–27)
CO2: 19 mmol/L — ABNORMAL LOW (ref 20–29)
Calcium: 9.1 mg/dL (ref 8.7–10.3)
Chloride: 103 mmol/L (ref 96–106)
Creatinine, Ser: 0.93 mg/dL (ref 0.57–1.00)
Glucose: 86 mg/dL (ref 70–99)
Phosphorus: 4.7 mg/dL — ABNORMAL HIGH (ref 3.0–4.3)
Potassium: 4.3 mmol/L (ref 3.5–5.2)
Sodium: 139 mmol/L (ref 134–144)
eGFR: 68 mL/min/{1.73_m2} (ref >59)

## 2022-06-27 NOTE — Telephone Encounter (Signed)
Unable to refill per protocol, Rx request was refilled 06/26/22 by provider.  Requested Prescriptions  Pending Prescriptions Disp Refills   telmisartan (MICARDIS) 40 MG tablet [Pharmacy Med Name: TELMISARTAN 40 MG TABLET] 90 tablet 0    Sig: TAKE 1 TABLET BY MOUTH EVERY DAY     Cardiovascular:  Angiotensin Receptor Blockers Failed - 06/26/2022  1:18 PM      Failed - K in normal range and within 180 days    Potassium  Date Value Ref Range Status  06/26/2022 4.3 3.5 - 5.2 mmol/L Final  07/30/2014 3.8 mmol/L Final    Comment:    3.5-5.1 NOTE: New Reference Range  07/06/14          Passed - Cr in normal range and within 180 days    Creatinine  Date Value Ref Range Status  07/30/2014 0.78 mg/dL Final    Comment:    0.44-1.00 NOTE: New Reference Range  07/06/14    Creatinine, Ser  Date Value Ref Range Status  06/26/2022 0.93 0.57 - 1.00 mg/dL Final         Passed - Patient is not pregnant      Passed - Last BP in normal range    BP Readings from Last 1 Encounters:  06/26/22 122/62         Passed - Valid encounter within last 6 months    Recent Outpatient Visits           Yesterday Essential hypertension   [facility] [place], Deanna C, MD   2 months ago Annual physical exam   [facility] [place], Deanna C, MD   8 months ago Essential hypertension   [facility] [place], Deanna C, MD   1 year ago Essential hypertension   [facility] [place], Deanna C, MD   1 year ago Essential hypertension   [facility] [place] [place], MD       Future Appointments             In 6 months Juline Patch, MD [place] [facility]

## 2022-07-04 ENCOUNTER — Other Ambulatory Visit: Payer: Self-pay

## 2022-07-04 ENCOUNTER — Telehealth: Payer: Self-pay | Admitting: Family Medicine

## 2022-07-04 ENCOUNTER — Encounter: Payer: Self-pay | Admitting: Family Medicine

## 2022-07-04 DIAGNOSIS — Z1231 Encounter for screening mammogram for malignant neoplasm of breast: Secondary | ICD-10-CM

## 2022-07-04 DIAGNOSIS — Z780 Asymptomatic menopausal state: Secondary | ICD-10-CM

## 2022-07-04 NOTE — Progress Notes (Signed)
Called pt- mammo and bone density 08/08/2022 @ 9:00 in [place]

## 2022-07-04 NOTE — Telephone Encounter (Signed)
Copied from [place] [id number]. Topic: General - Other >> Jul 04, 2022  8:43 AM Oley Balm A wrote: Reason for CRM: Pt is calling wanting to speak with Baxter Flattery regarding her bone density test and mammogram. Pt states that she is needing a request put in for both and will need an appointment in the morning and will need to be done after 08/04/22. Please have Baxter Flattery call pt back.

## 2022-07-19 DIAGNOSIS — H401131 Primary open-angle glaucoma, bilateral, mild stage: Secondary | ICD-10-CM | POA: Diagnosis not present

## 2022-07-19 DIAGNOSIS — H2513 Age-related nuclear cataract, bilateral: Secondary | ICD-10-CM | POA: Diagnosis not present

## 2022-08-06 ENCOUNTER — Other Ambulatory Visit: Payer: Self-pay | Admitting: Family Medicine

## 2022-08-06 DIAGNOSIS — J4520 Mild intermittent asthma, uncomplicated: Secondary | ICD-10-CM

## 2022-08-08 ENCOUNTER — Ambulatory Visit
Admission: RE | Admit: 2022-08-08 | Discharge: 2022-08-08 | Disposition: A | Discharge status: Home / Self Care | Payer: Medicare Other | Source: Ambulatory Visit | Attending: Family Medicine | Admitting: Family Medicine

## 2022-08-08 DIAGNOSIS — Z780 Asymptomatic menopausal state: Secondary | ICD-10-CM | POA: Insufficient documentation

## 2022-08-08 DIAGNOSIS — Z1231 Encounter for screening mammogram for malignant neoplasm of breast: Secondary | ICD-10-CM | POA: Diagnosis not present

## 2022-08-09 ENCOUNTER — Ambulatory Visit: Payer: Self-pay | Admitting: *Deleted

## 2022-08-09 NOTE — Telephone Encounter (Signed)
  Chief Complaint: Pt calling in for mammogram results but they have not been resulted.    She is upset because she has seen a result and is crying and upset. Symptoms:  Frequency:  Pertinent Negatives: Patient denies  Disposition: [] ED /[] Urgent Care (no appt availability in office) / [] Appointment(In office/virtual)/ []  [facility]/ [] Home Care/ [] Refused Recommended Disposition /[] [facility]/ [x]  Follow-up with PCP Additional Notes: Message sent to Dr. Yetta Barre that pt is upset about her mammogram results.   No results released.

## 2022-08-09 NOTE — Telephone Encounter (Signed)
Reason for Disposition  [1] Caller requesting NON-URGENT health information AND [2] PCP's office is the best resource    Mammogram results  Answer Assessment - Initial Assessment Questions 1. REASON FOR CALL or QUESTION: "What is your reason for calling today?" or "How can I best help you?" or "What question do you have that I can help answer?"     Pt called in for her mammogram results but the results have not been resulted by Dr. Yetta Barre.  Protocols used: Information Only Call - No Triage-A-AH

## 2022-08-09 NOTE — Telephone Encounter (Signed)
Called pt went over mammogram with ppt told her that she needs further imaging because the test was incomplete. Dan spoke to pt as well. Pt verbalized understanding.  KP

## 2022-08-10 ENCOUNTER — Other Ambulatory Visit: Payer: Self-pay | Admitting: Family Medicine

## 2022-08-10 DIAGNOSIS — R928 Other abnormal and inconclusive findings on diagnostic imaging of breast: Secondary | ICD-10-CM

## 2022-08-10 DIAGNOSIS — N6489 Other specified disorders of breast: Secondary | ICD-10-CM

## 2022-08-14 ENCOUNTER — Ambulatory Visit
Admission: RE | Admit: 2022-08-14 | Discharge: 2022-08-14 | Disposition: A | Discharge status: Home / Self Care | Payer: Medicare Other | Source: Ambulatory Visit | Attending: Family Medicine | Admitting: Family Medicine

## 2022-08-14 DIAGNOSIS — N6489 Other specified disorders of breast: Secondary | ICD-10-CM

## 2022-08-14 DIAGNOSIS — R928 Other abnormal and inconclusive findings on diagnostic imaging of breast: Secondary | ICD-10-CM | POA: Diagnosis not present

## 2022-08-17 DIAGNOSIS — I10 Essential (primary) hypertension: Secondary | ICD-10-CM | POA: Diagnosis not present

## 2022-08-17 DIAGNOSIS — Z713 Dietary counseling and surveillance: Secondary | ICD-10-CM | POA: Diagnosis not present

## 2022-08-17 DIAGNOSIS — M170 Bilateral primary osteoarthritis of knee: Secondary | ICD-10-CM | POA: Diagnosis not present

## 2022-08-17 DIAGNOSIS — G4733 Obstructive sleep apnea (adult) (pediatric): Secondary | ICD-10-CM | POA: Diagnosis not present

## 2022-08-17 DIAGNOSIS — R7303 Prediabetes: Secondary | ICD-10-CM | POA: Diagnosis not present

## 2022-08-17 DIAGNOSIS — Z8249 Family history of ischemic heart disease and other diseases of the circulatory system: Secondary | ICD-10-CM | POA: Diagnosis not present

## 2022-08-17 DIAGNOSIS — E782 Mixed hyperlipidemia: Secondary | ICD-10-CM | POA: Diagnosis not present

## 2022-08-22 ENCOUNTER — Encounter: Payer: Self-pay | Admitting: Family Medicine

## 2022-08-22 ENCOUNTER — Ambulatory Visit (INDEPENDENT_AMBULATORY_CARE_PROVIDER_SITE_OTHER): Payer: Medicare Other | Admitting: Family Medicine

## 2022-08-22 ENCOUNTER — Ambulatory Visit: Payer: Self-pay | Admitting: *Deleted

## 2022-08-22 VITALS — BP 120/72 | HR 78 | Ht 63.0 in | Wt 226.0 lb

## 2022-08-22 DIAGNOSIS — L237 Allergic contact dermatitis due to plants, except food: Secondary | ICD-10-CM | POA: Diagnosis not present

## 2022-08-22 MED ORDER — PREDNISONE 10 MG PO TABS
ORAL_TABLET | ORAL | 0 refills | Status: DC
Start: 2022-08-22 — End: 2022-12-04

## 2022-08-22 NOTE — Progress Notes (Addendum)
Date:  08/22/2022   Name:  Diane Small   DOB:  May 21, 1955   MRN:  [id number]   Chief Complaint: Poison Ivy (Worked in yard, covered hands and back)  [employer] This is a new problem. The current episode started in the past 7 days. The problem has been gradually improving since onset. The rash is diffuse. The rash is characterized by redness and itchiness. She was exposed to plant contact. Pertinent negatives include no cough, rhinorrhea or shortness of breath. Past treatments include anti-itch cream.    Lab Results  Component Value Date   NA 139 06/26/2022   K 4.3 06/26/2022   CO2 19 (L) 06/26/2022   GLUCOSE 86 06/26/2022   BUN 15 06/26/2022   CREATININE 0.93 06/26/2022   CALCIUM 9.1 06/26/2022   EGFR 68 06/26/2022   GFRNONAA >60 05/08/2021   Lab Results  Component Value Date   CHOL 148 04/26/2022   HDL 47 04/26/2022   LDLCALC 81 04/26/2022   TRIG 112 04/26/2022   CHOLHDL 3.2 10/15/2017   Lab Results  Component Value Date   TSH 1.210 10/15/2017   Lab Results  Component Value Date   HGBA1C 5.5 06/12/2021   Lab Results  Component Value Date   WBC 9.9 06/12/2021   HGB 12.6 06/12/2021   HCT 39.8 06/12/2021   MCV 86 06/12/2021   PLT 449 06/12/2021   Lab Results  Component Value Date   ALT 19 04/26/2022   AST 23 04/26/2022   ALKPHOS 82 04/26/2022   BILITOT 0.3 04/26/2022   No results found for: "25OHVITD2", "25OHVITD3", "VD25OH"   Review of Systems  HENT:  Negative for postnasal drip and rhinorrhea.   Respiratory:  Negative for cough and shortness of breath.   Cardiovascular:  Negative for chest pain, palpitations and leg swelling.    Patient Active Problem List   Diagnosis Date Noted   Surgery, elective 05/05/2021   S/P TKR (total knee replacement) 05/05/2021   S/P TKR (total knee replacement) using cement, right 05/04/2021   Rotator cuff tendinitis, right 10/05/2020   Pure hypercholesterolemia 04/16/2017   Hyperlipidemia 04/18/2016   DDD  (degenerative disc disease), lumbosacral 10/17/2015   Esophageal reflux 04/18/2015   Essential hypertension 04/18/2015   Annual physical exam 04/18/2015   Routine general medical examination at a health care facility 09/01/2014   Glaucoma 09/01/2014   BMI 45.0-49.9, adult ([facility]) 09/01/2014   Arthritis of knee, degenerative 09/09/2013    Allergies  Allergen Reactions   Amoxicillin-Pot Clavulanate Diarrhea    TOLERATED CEFAZOLIN   Azithromycin Diarrhea    Past Surgical History:  Procedure Laterality Date   CERVICAL SPINE SURGERY  03/01/1981   C4 fracture from [place]   CESAREAN SECTION     1989, 1991   COLONOSCOPY WITH ESOPHAGOGASTRODUODENOSCOPY (EGD)  2011   COLONOSCOPY WITH PROPOFOL N/A 07/13/2019   Procedure: COLONOSCOPY WITH PROPOFOL;  Surgeon: Midge Minium, MD;  Location: [facility];  Service: Endoscopy;  Laterality: N/A;   ESOPHAGOGASTRODUODENOSCOPY (EGD) WITH PROPOFOL N/A 07/13/2019   Procedure: ESOPHAGOGASTRODUODENOSCOPY (EGD) WITH PROPOFOL;  Surgeon: Midge Minium, MD;  Location: [facility];  Service: Endoscopy;  Laterality: N/A;  PRIORITY 3   KNEE ARTHROSCOPY Left 07/20/2021   Procedure: Left knee arthroscopy and attempted loose foreign body removal;  Surgeon: Kennedy Bucker, MD;  Location: [facility];  Service: Orthopedics;  Laterality: Left;   TOTAL ABDOMINAL HYSTERECTOMY  1992   TOTAL KNEE ARTHROPLASTY Left 07/27/2014   TOTAL KNEE ARTHROPLASTY Right 05/04/2021  Procedure: TOTAL KNEE ARTHROPLASTY;  Surgeon: Kennedy Bucker, MD;  Location: [facility];  Service: Orthopedics;  Laterality: Right;    Social History   Tobacco Use   Smoking status: Never   Smokeless tobacco: Never  Vaping Use   Vaping Use: Never used  Substance Use Topics   Alcohol use: Yes    Comment: rarely   Drug use: No     Medication list has been reviewed and updated.  Current Meds  Medication Sig   acetaminophen (TYLENOL) 650 MG CR tablet Take 1,300 mg by mouth every 8 (eight) hours  as needed for pain.   albuterol (VENTOLIN HFA) 108 (90 Base) MCG/ACT inhaler INHALE 1-2 PUFFS BY MOUTH EVERY 6 HOURS AS NEEDED FOR WHEEZE OR SHORTNESS OF BREATH   ALPRAZolam (XANAX) 1 MG tablet Take 2 mg by mouth at bedtime. psych   aspirin EC 81 MG tablet Take 81 mg by mouth daily. Swallow whole.   azelastine (ASTELIN) 0.1 % nasal spray Place 1 spray into both nostrils 2 (two) times daily. Dr Andee Poles   Cranberry 4200 MG CAPS Take by mouth.   escitalopram (LEXAPRO) 5 MG tablet Take 5 mg by mouth daily. psych   latanoprost (XALATAN) 0.005 % ophthalmic solution Place 1 drop into both eyes at bedtime. Dr Inez Pilgrim   montelukast (SINGULAIR) 10 MG tablet TAKE 1 TABLET BY MOUTH EVERY DAY   Multiple Vitamin (MULTIVITAMIN) capsule Take 1 capsule by mouth daily.   Omega-3 Fatty Acids (FISH OIL PO) Take 1 capsule by mouth daily.   potassium chloride (KLOR-CON M10) 10 MEQ tablet Take 1 tablet (10 mEq total) by mouth daily.   telmisartan (MICARDIS) 40 MG tablet Take 1 tablet (40 mg total) by mouth daily.   timolol (TIMOPTIC) 0.5 % ophthalmic solution Place 1 drop into both eyes daily. Dr Inez Pilgrim   topiramate (TOPAMAX) 25 MG tablet Take 25 mg by mouth daily.   traMADol (ULTRAM) 50 MG tablet Take 1 tablet (50 mg total) by mouth every 6 (six) hours as needed for moderate pain or severe pain.   Turmeric 500 MG CAPS Take 500 mg by mouth daily.       08/22/2022   10:43 AM 06/26/2022    1:52 PM 04/26/2022    9:36 AM 10/24/2021    9:25 AM  GAD 7 : Generalized Anxiety Score  Nervous, Anxious, on Edge 0 0 0 0  Control/stop worrying 0 0 0 0  Worry too much - different things 0 0 0 0  Trouble relaxing 0 0 0 0  Restless 0 0 0 0  Easily annoyed or irritable 0 0 0 0  Afraid - awful might happen 0 0 0 0  Total GAD 7 Score 0 0 0 0  Anxiety Difficulty Not difficult at all Not difficult at all Not difficult at all Not difficult at all       08/22/2022   10:43 AM 06/26/2022    1:52 PM 04/26/2022    9:36 AM   Depression screen PHQ 2/9  Decreased Interest 0 0 0  Down, Depressed, Hopeless 0 0 0  PHQ - 2 Score 0 0 0  Altered sleeping 0 0 0  Tired, decreased energy 0 0 0  Change in appetite 0 0 0  Feeling bad or failure about yourself  0 0 0  Trouble concentrating 0 0 0  Moving slowly or fidgety/restless 0 0 0  Suicidal thoughts 0 0 0  PHQ-9 Score 0 0 0  Difficult doing work/chores Not  difficult at all Not difficult at all Not difficult at all    BP Readings from Last 3 Encounters:  08/22/22 120/72  06/26/22 122/62  04/26/22 110/78    Physical Exam Vitals and nursing note reviewed.  HENT:     Head: Normocephalic and atraumatic.     Right Ear: Tympanic membrane and ear canal normal. There is no impacted cerumen.     Left Ear: Tympanic membrane and ear canal normal. There is no impacted cerumen.     Nose: Nose normal. No congestion or rhinorrhea.     Mouth/Throat:     Mouth: Mucous membranes are moist.  Cardiovascular:     Heart sounds: No murmur heard.    No friction rub. No gallop.  Pulmonary:     Breath sounds: No wheezing, rhonchi or rales.  Skin:    Findings: Erythema and rash present.     Comments: With [place]     Wt Readings from Last 3 Encounters:  08/22/22 226 lb (102.5 kg)  06/26/22 225 lb (102.1 kg)  04/26/22 230 lb (104.3 kg)    BP 120/72   Pulse 78   Ht 5\' 3"  (1.6 m)   Wt 226 lb (102.5 kg)   SpO2 99%   BMI 40.03 kg/m   Assessment and Plan: 1. Contact dermatitis due to poison oak New onset.  Persistent.  Stable.  Patient was doing work in the yard removing "weeds "and within the next day had broken out in a rash involving the majority of her exposed body includes arms lower legs and even her back.  In addition to suggesting a nonsedating antihistamine such as Claritin or Zyrtec in the morning and Benadryl at night patient will be started on a prednisone taper beginning at 60 mg over a 2-week.  Will recheck on as-needed basis. - predniSONE (DELTASONE) 10  MG tablet; Taper 6,6,6,5,5,5,4,4,3,3,2,2,1,1  Dispense: 53 tablet; Refill: 0  Elizabeth Sauer, MD

## 2022-08-22 NOTE — Telephone Encounter (Signed)
Reason for Disposition  [1] Severe poison ivy, oak, or sumac reaction in the past AND [2] face or genitals involved  Answer Assessment - Initial Assessment Questions 1. APPEARNCE of RASH: "Describe the rash."      I have poison Ivy.     I have bumps that are spreading.    I'm allergic to poison ivy.     I've been working in the yard. 2. LOCATION: "Where is the rash located?"  (e.g., face, genitals, hands, legs)     My hands, face and chest.  Bad on my hands. 3. SIZE: "How large is the rash?"      It's spreading.    It's blistering.    I've tried Benadryl and using Calamine lotion but it's not helping. 4. ONSET: "When did the rash begin?"      Sat. 5. ITCHING: "Does the rash itch?" If Yes, ask: "How bad is it?"   - MILD - doesn't interfere with normal activities   - MODERATE-SEVERE: interferes with work, school, sleep, or other activities      Itching bad 6. EXPOSURE:  "How were you exposed to the plant (poison ivy, poison oak, sumac)"  "When were you exposed?"       Yes working in the yard 7. PAST HISTORY: "Have you had a poison ivy rash before?" If Yes, ask: "How bad was it?"     Yes 8. PREGNANCY: "Is there any chance you are pregnant?" "When was your last menstrual period?"     N/A  Protocols used: Poison Ivy - Oak - [facility]

## 2022-08-22 NOTE — Telephone Encounter (Signed)
  Chief Complaint: Poison Ivy  Symptoms: Itching, blisters and it's spreading and itching real bad even with OTC Benadryl spray and oral Benadryl Frequency: Since Sat. After working in the yard Pertinent Negatives: Patient denies N/A Disposition: ED /[] Urgent Care (no appt availability in office) / Appointment(In office/virtual)/  [facility]/ Home Care/ Refused Recommended Disposition /[] [facility]/  Follow-up with PCP Additional Notes: Called into the practice to see about getting her in  today however nothing available.   Pt. Agreeable to seeing Dr Yetta Barre 08/23/2022 at 11:00. Unable to get in within 4 hours per protocol however pt. Agreeable to waiting until tomorrow. Instructed her to continue using the Benadryl.

## 2022-08-23 ENCOUNTER — Ambulatory Visit: Payer: Medicare Other | Admitting: Family Medicine

## 2022-10-23 ENCOUNTER — Ambulatory Visit: Payer: Medicare Other | Admitting: Family Medicine

## 2022-11-09 ENCOUNTER — Other Ambulatory Visit: Payer: Self-pay | Admitting: Family Medicine

## 2022-11-09 DIAGNOSIS — J4520 Mild intermittent asthma, uncomplicated: Secondary | ICD-10-CM

## 2022-11-09 NOTE — Telephone Encounter (Signed)
Requested Prescriptions  Pending Prescriptions Disp Refills   montelukast (SINGULAIR) 10 MG tablet [Pharmacy Med Name: MONTELUKAST SOD 10 MG TABLET] 90 tablet 0    Sig: TAKE 1 TABLET BY MOUTH EVERY DAY     Pulmonology:  Leukotriene Inhibitors Passed - 11/09/2022  9:52 AM      Passed - Valid encounter within last 12 months    Recent Outpatient Visits           2 months ago Contact dermatitis due to poison [facility]   [facility]   4 months ago Essential hypertension   [facility]   6 months ago Annual physical exam   [facility]   1 year ago Essential hypertension   [place]   1 year ago Essential hypertension    [facility]       Future Appointments             In 2 months Duanne Limerick, MD [facility] at [facility]

## 2022-12-04 ENCOUNTER — Ambulatory Visit (INDEPENDENT_AMBULATORY_CARE_PROVIDER_SITE_OTHER): Payer: Medicare Other

## 2022-12-04 DIAGNOSIS — Z0000 Encounter for general adult medical examination without abnormal findings: Secondary | ICD-10-CM

## 2022-12-04 NOTE — Patient Instructions (Addendum)
Diane Small , Thank you for taking time to come for your Medicare Wellness Visit. I appreciate your ongoing commitment to your health goals. Please review the following plan we discussed and let me know if I can assist you in the future.   Referrals/Orders/Follow-Ups/Clinician Recommendations: none  This is a list of the screening recommended for you and due dates:  Health Maintenance  Topic Date Due   Hepatitis C Screening  Never done   Flu Shot  11/29/2022   Mammogram  08/14/2023   Medicare Annual Wellness Visit  12/04/2023   Colon Cancer Screening  07/12/2029   DTaP/Tdap/Td vaccine (3 - Td or Tdap) 08/12/2031   Pneumonia Vaccine  Completed   DEXA scan (bone density measurement)  Completed   COVID-19 Vaccine  Completed   Zoster (Shingles) Vaccine  Completed   HPV Vaccine  Aged Out    Advanced directives: (ACP Link)Information on Advanced Care Planning can be found at [facility] of [facility] Advance Health Care Directives Advance Health Care Directives ([email or web address]   Next Medicare Annual Wellness Visit scheduled for next year: Yes   12/10/23 @ 9:45 am by phone  Preventive Care 65 Years and Older, Female Preventive care refers to lifestyle choices and visits with your health care provider that can promote health and wellness. What does preventive care include? A yearly physical exam. This is also called an annual well check. Dental exams once or twice a year. Routine eye exams. Ask your health care provider how often you should have your eyes checked. Personal lifestyle choices, including: Daily care of your teeth and gums. Regular physical activity. Eating a healthy diet. Avoiding tobacco and drug use. Limiting alcohol use. Practicing safe sex. Taking low-dose aspirin every day. Taking vitamin and mineral supplements as recommended by your health care provider. What happens during an annual well check? The services and screenings done by your health care provider  during your annual well check will depend on your age, overall health, lifestyle risk factors, and family history of disease. Counseling  Your health care provider may ask you questions about your: Alcohol use. Tobacco use. Drug use. Emotional well-being. Home and relationship well-being. Sexual activity. Eating habits. History of falls. Memory and ability to understand (cognition). Work and work Astronomer. Reproductive health. Screening  You may have the following tests or measurements: Height, weight, and BMI. Blood pressure. Lipid and cholesterol levels. These may be checked every 5 years, or more frequently if you are over 29 years old. Skin check. Lung cancer screening. You may have this screening every year starting at age 74 if you have a 30-pack-year history of smoking and currently smoke or have quit within the past 15 years. Fecal occult blood test (FOBT) of the stool. You may have this test every year starting at age 62. Flexible sigmoidoscopy or colonoscopy. You may have a sigmoidoscopy every 5 years or a colonoscopy every 10 years starting at age 85. Hepatitis C blood test. Hepatitis B blood test. Sexually transmitted disease (STD) testing. Diabetes screening. This is done by checking your blood sugar (glucose) after you have not eaten for a while (fasting). You may have this done every 1-3 years. Bone density scan. This is done to screen for osteoporosis. You may have this done starting at age 82. Mammogram. This may be done every 1-2 years. Talk to your health care provider about how often you should have regular mammograms. Talk with your health care provider about your test results, treatment options, and if  necessary, the need for more tests. Vaccines  Your health care provider may recommend certain vaccines, such as: Influenza vaccine. This is recommended every year. Tetanus, diphtheria, and acellular pertussis (Tdap, Td) vaccine. You may need a Td booster every  10 years. Zoster vaccine. You may need this after age 7. Pneumococcal 13-valent conjugate (PCV13) vaccine. One dose is recommended after age 25. Pneumococcal polysaccharide (PPSV23) vaccine. One dose is recommended after age 3. Talk to your health care provider about which screenings and vaccines you need and how often you need them. This information is not intended to replace advice given to you by your health care provider. Make sure you discuss any questions you have with your health care provider. Document Released: 05/13/2015 Document Revised: 01/04/2016 Document Reviewed: 02/15/2015 Elsevier Interactive Patient Education  2017 [employer].  Fall Prevention in the Home Falls can cause injuries. They can happen to people of all ages. There are many things you can do to make your home safe and to help prevent falls. What can I do on the outside of my home? Regularly fix the edges of walkways and driveways and fix any cracks. Remove anything that might make you trip as you walk through a door, such as a raised step or threshold. Trim any bushes or trees on the path to your home. Use bright outdoor lighting. Clear any walking paths of anything that might make someone trip, such as rocks or tools. Regularly check to see if handrails are loose or broken. Make sure that both sides of any steps have handrails. Any raised decks and porches should have guardrails on the edges. Have any leaves, snow, or ice cleared regularly. Use sand or salt on walking paths during winter. Clean up any spills in your garage right away. This includes oil or grease spills. What can I do in the bathroom? Use night lights. Install grab bars by the toilet and in the tub and shower. Do not use towel bars as grab bars. Use non-skid mats or decals in the tub or shower. If you need to sit down in the shower, use a plastic, non-slip stool. Keep the floor dry. Clean up any water that spills on the floor as soon as it  happens. Remove soap buildup in the tub or shower regularly. Attach bath mats securely with double-sided non-slip rug tape. Do not have throw rugs and other things on the floor that can make you trip. What can I do in the bedroom? Use night lights. Make sure that you have a light by your bed that is easy to reach. Do not use any sheets or blankets that are too big for your bed. They should not hang down onto the floor. Have a firm chair that has side arms. You can use this for support while you get dressed. Do not have throw rugs and other things on the floor that can make you trip. What can I do in the kitchen? Clean up any spills right away. Avoid walking on wet floors. Keep items that you use a lot in easy-to-reach places. If you need to reach something above you, use a strong step stool that has a grab bar. Keep electrical cords out of the way. Do not use floor polish or wax that makes floors slippery. If you must use wax, use non-skid floor wax. Do not have throw rugs and other things on the floor that can make you trip. What can I do with my stairs? Do not leave any items  on the stairs. Make sure that there are handrails on both sides of the stairs and use them. Fix handrails that are broken or loose. Make sure that handrails are as long as the stairways. Check any carpeting to make sure that it is firmly attached to the stairs. Fix any carpet that is loose or worn. Avoid having throw rugs at the top or bottom of the stairs. If you do have throw rugs, attach them to the floor with carpet tape. Make sure that you have a light switch at the top of the stairs and the bottom of the stairs. If you do not have them, ask someone to add them for you. What else can I do to help prevent falls? Wear shoes that: Do not have high heels. Have rubber bottoms. Are comfortable and fit you well. Are closed at the toe. Do not wear sandals. If you use a stepladder: Make sure that it is fully opened.  Do not climb a closed stepladder. Make sure that both sides of the stepladder are locked into place. Ask someone to hold it for you, if possible. Clearly mark and make sure that you can see: Any grab bars or handrails. First and last steps. Where the edge of each step is. Use tools that help you move around (mobility aids) if they are needed. These include: Canes. Walkers. Scooters. Crutches. Turn on the lights when you go into a dark area. Replace any light bulbs as soon as they burn out. Set up your furniture so you have a clear path. Avoid moving your furniture around. If any of your floors are uneven, fix them. If there are any pets around you, be aware of where they are. Review your medicines with your doctor. Some medicines can make you feel dizzy. This can increase your chance of falling. Ask your doctor what other things that you can do to help prevent falls. This information is not intended to replace advice given to you by your health care provider. Make sure you discuss any questions you have with your health care provider. Document Released: 02/10/2009 Document Revised: 09/22/2015 Document Reviewed: 05/21/2014 Elsevier Interactive Patient Education  2017 [employer].
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
## 2022-12-25 ENCOUNTER — Ambulatory Visit: Payer: Medicare Other | Admitting: Family Medicine

## 2023-01-05 ENCOUNTER — Other Ambulatory Visit: Payer: Self-pay | Admitting: Family Medicine

## 2023-01-05 DIAGNOSIS — E876 Hypokalemia: Secondary | ICD-10-CM

## 2023-01-05 DIAGNOSIS — I10 Essential (primary) hypertension: Secondary | ICD-10-CM

## 2023-01-07 NOTE — Telephone Encounter (Signed)
Requested Prescriptions  Pending Prescriptions Disp Refills   telmisartan (MICARDIS) 40 MG tablet [Pharmacy Med Name: TELMISARTAN 40 MG TABLET] 90 tablet 0    Sig: TAKE 1 TABLET BY MOUTH EVERY DAY     Cardiovascular:  Angiotensin Receptor Blockers Failed - 01/05/2023  1:02 PM      Failed - Cr in normal range and within 180 days    Creatinine  Date Value Ref Range Status  07/30/2014 0.78 mg/dL Final    Comment:    1.61-0.96 NOTE: New Reference Range  07/06/14    Creatinine, Ser  Date Value Ref Range Status  06/26/2022 0.93 0.57 - 1.00 mg/dL Final         Failed - K in normal range and within 180 days    Potassium  Date Value Ref Range Status  06/26/2022 4.3 3.5 - 5.2 mmol/L Final  07/30/2014 3.8 mmol/L Final    Comment:    3.5-5.1 NOTE: New Reference Range  07/06/14          Passed - Patient is not pregnant      Passed - Last BP in normal range    BP Readings from Last 1 Encounters:  08/22/22 120/72         Passed - Valid encounter within last 6 months    Recent Outpatient Visits           4 months ago Contact dermatitis due to poison [facility]   [place] [facility]   6 months ago Essential hypertension   [place] [facility], [facility]   8 months ago Annual physical exam   [facility]   1 year ago Essential hypertension   [place] [facility], [facility]   1 year ago Essential hypertension   [place] [facility], [facility]       Future Appointments             In 1 week Duanne Limerick, MD [facility] at [facility]             KLOR-CON M10 10 MEQ tablet [Pharmacy Med Name: KLOR-CON M10 TABLET] 90 tablet 0    Sig: TAKE 1 TABLET BY  MOUTH EVERY DAY     Endocrinology:  Minerals - Potassium Supplementation Passed - 01/05/2023  1:02 PM      Passed - K in normal range and within 360 days    Potassium  Date Value Ref Range Status  06/26/2022 4.3 3.5 - 5.2 mmol/L Final  07/30/2014 3.8 mmol/L Final    Comment:    3.5-5.1 NOTE: New Reference Range  07/06/14          Passed - Cr in normal range and within 360 days    Creatinine  Date Value Ref Range Status  07/30/2014 0.78 mg/dL Final    Comment:    0.45-4.09 NOTE: New Reference Range  07/06/14    Creatinine, Ser  Date Value Ref Range Status  06/26/2022 0.93 0.57 - 1.00 mg/dL Final         Passed - Valid encounter within last 12 months    Recent Outpatient Visits  4 months ago Contact dermatitis due to poison [facility]   [place] [facility]   6 months ago Essential hypertension   [place] [facility], [facility]   8 months ago Annual physical exam   [facility]   1 year ago Essential hypertension   [place] [facility]   1 year ago Essential hypertension   [place] [facility], [facility]       Future Appointments             In 1 week Duanne Limerick, MD [facility] at [facility], [facility]

## 2023-01-15 ENCOUNTER — Encounter: Payer: Self-pay | Admitting: Family Medicine

## 2023-01-15 ENCOUNTER — Ambulatory Visit (INDEPENDENT_AMBULATORY_CARE_PROVIDER_SITE_OTHER): Payer: Medicare Other | Admitting: Family Medicine

## 2023-01-15 VITALS — BP 122/78 | HR 68 | Ht 63.0 in | Wt 200.0 lb

## 2023-01-15 DIAGNOSIS — J4520 Mild intermittent asthma, uncomplicated: Secondary | ICD-10-CM

## 2023-01-15 DIAGNOSIS — I10 Essential (primary) hypertension: Secondary | ICD-10-CM | POA: Diagnosis not present

## 2023-01-15 MED ORDER — MONTELUKAST SODIUM 10 MG PO TABS
10.0000 mg | ORAL_TABLET | Freq: Every day | ORAL | 1 refills | Status: AC
Start: 2023-01-15 — End: ?

## 2023-01-15 MED ORDER — TELMISARTAN 40 MG PO TABS: 40.0000 mg | ORAL_TABLET | Freq: Every day | ORAL | 1 refills | Status: DC
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
## 2023-01-24 DIAGNOSIS — H401131 Primary open-angle glaucoma, bilateral, mild stage: Secondary | ICD-10-CM | POA: Diagnosis not present

## 2023-01-24 DIAGNOSIS — H2513 Age-related nuclear cataract, bilateral: Secondary | ICD-10-CM | POA: Diagnosis not present

## 2023-01-25 DIAGNOSIS — Z713 Dietary counseling and surveillance: Secondary | ICD-10-CM | POA: Diagnosis not present

## 2023-01-25 DIAGNOSIS — M5137 Other intervertebral disc degeneration, lumbosacral region: Secondary | ICD-10-CM | POA: Diagnosis not present

## 2023-01-25 DIAGNOSIS — Z8639 Personal history of other endocrine, nutritional and metabolic disease: Secondary | ICD-10-CM | POA: Diagnosis not present

## 2023-01-25 DIAGNOSIS — I10 Essential (primary) hypertension: Secondary | ICD-10-CM | POA: Diagnosis not present

## 2023-03-22 ENCOUNTER — Other Ambulatory Visit: Payer: Self-pay | Admitting: Family Medicine

## 2023-03-22 DIAGNOSIS — E876 Hypokalemia: Secondary | ICD-10-CM

## 2023-04-29 ENCOUNTER — Encounter: Payer: Medicare Other | Admitting: Family Medicine

## 2023-05-29 ENCOUNTER — Other Ambulatory Visit: Payer: Self-pay | Admitting: Family Medicine

## 2023-05-29 DIAGNOSIS — K219 Gastro-esophageal reflux disease without esophagitis: Secondary | ICD-10-CM

## 2023-06-20 ENCOUNTER — Other Ambulatory Visit: Payer: Self-pay | Admitting: Family Medicine

## 2023-06-20 DIAGNOSIS — E876 Hypokalemia: Secondary | ICD-10-CM

## 2023-06-28 ENCOUNTER — Telehealth: Payer: Self-pay

## 2023-06-28 NOTE — Telephone Encounter (Signed)
 Received FAX. Will have Dr Yetta Barre sign and fax back.   Copied from CRM [id number]. Topic: Medical Record Request - Other >> Jun 27, 2023  9:50 AM Martha Clan wrote: Reason for CRM: Patient is trying to get medicare chronic insurance. The insurance needs the chronic condition verification form for [employer]. Diabetes, CHF, or Cardio Vascular disorders. Call back: [phone] Fax: [phone]

## 2023-07-03 ENCOUNTER — Other Ambulatory Visit: Payer: Self-pay | Admitting: Family Medicine

## 2023-07-03 DIAGNOSIS — J4520 Mild intermittent asthma, uncomplicated: Secondary | ICD-10-CM

## 2023-07-19 ENCOUNTER — Other Ambulatory Visit: Payer: Self-pay | Admitting: Internal Medicine

## 2023-07-19 DIAGNOSIS — Z1231 Encounter for screening mammogram for malignant neoplasm of breast: Secondary | ICD-10-CM

## 2023-08-12 ENCOUNTER — Ambulatory Visit
Admission: RE | Admit: 2023-08-12 | Discharge: 2023-08-12 | Disposition: A | Discharge status: Home / Self Care | Source: Ambulatory Visit | Attending: Internal Medicine | Admitting: Internal Medicine

## 2023-08-12 DIAGNOSIS — Z1231 Encounter for screening mammogram for malignant neoplasm of breast: Secondary | ICD-10-CM | POA: Insufficient documentation

## 2023-09-12 ENCOUNTER — Other Ambulatory Visit: Payer: Self-pay | Admitting: Family Medicine

## 2023-09-12 DIAGNOSIS — I10 Essential (primary) hypertension: Secondary | ICD-10-CM

## 2023-09-21 ENCOUNTER — Other Ambulatory Visit: Payer: Self-pay | Admitting: Family Medicine

## 2023-09-21 DIAGNOSIS — E876 Hypokalemia: Secondary | ICD-10-CM

## 2023-09-30 ENCOUNTER — Other Ambulatory Visit: Payer: Self-pay | Admitting: Family Medicine

## 2023-09-30 DIAGNOSIS — I10 Essential (primary) hypertension: Secondary | ICD-10-CM

## 2023-09-30 DIAGNOSIS — J4520 Mild intermittent asthma, uncomplicated: Secondary | ICD-10-CM

## 2023-12-05 ENCOUNTER — Encounter
